# Patient Record
Sex: Female | Born: 1955 | Race: White | Hispanic: No | Marital: Married | Smoking: Former smoker
Health system: Southern US, Community
[De-identification: ages and names within clinical notes are randomized; demographics above are authoritative.]

## PROBLEM LIST (undated history)

## (undated) DIAGNOSIS — Z7189 Other specified counseling: Secondary | ICD-10-CM

## (undated) DIAGNOSIS — G43909 Migraine, unspecified, not intractable, without status migrainosus: Secondary | ICD-10-CM

## (undated) DIAGNOSIS — E039 Hypothyroidism, unspecified: Secondary | ICD-10-CM

## (undated) DIAGNOSIS — K589 Irritable bowel syndrome without diarrhea: Secondary | ICD-10-CM

## (undated) DIAGNOSIS — Z5111 Encounter for antineoplastic chemotherapy: Secondary | ICD-10-CM

## (undated) DIAGNOSIS — K219 Gastro-esophageal reflux disease without esophagitis: Secondary | ICD-10-CM

## (undated) DIAGNOSIS — M797 Fibromyalgia: Secondary | ICD-10-CM

## (undated) DIAGNOSIS — C801 Malignant (primary) neoplasm, unspecified: Secondary | ICD-10-CM

## (undated) DIAGNOSIS — R011 Cardiac murmur, unspecified: Secondary | ICD-10-CM

## (undated) DIAGNOSIS — G709 Myoneural disorder, unspecified: Secondary | ICD-10-CM

## (undated) DIAGNOSIS — F32A Depression, unspecified: Secondary | ICD-10-CM

## (undated) DIAGNOSIS — J189 Pneumonia, unspecified organism: Secondary | ICD-10-CM

## (undated) DIAGNOSIS — F419 Anxiety disorder, unspecified: Secondary | ICD-10-CM

## (undated) DIAGNOSIS — F329 Major depressive disorder, single episode, unspecified: Secondary | ICD-10-CM

## (undated) DIAGNOSIS — C3492 Malignant neoplasm of unspecified part of left bronchus or lung: Principal | ICD-10-CM

## (undated) DIAGNOSIS — J42 Unspecified chronic bronchitis: Secondary | ICD-10-CM

## (undated) HISTORY — DX: Malignant (primary) neoplasm, unspecified: C80.1

## (undated) HISTORY — PX: INNER EAR SURGERY: SHX679

## (undated) HISTORY — PX: APPENDECTOMY: SHX54

## (undated) HISTORY — PX: MOLE REMOVAL: SHX2046

## (undated) HISTORY — PX: ABDOMINAL HYSTERECTOMY: SHX81

## (undated) HISTORY — DX: Other specified counseling: Z71.89

## (undated) HISTORY — DX: Encounter for antineoplastic chemotherapy: Z51.11

## (undated) HISTORY — DX: Malignant neoplasm of unspecified part of left bronchus or lung: C34.92

## (undated) HISTORY — PX: MASTOIDECTOMY: SHX711

---

## 2010-08-24 ENCOUNTER — Other Ambulatory Visit (HOSPITAL_COMMUNITY): Payer: Self-pay | Admitting: *Deleted

## 2010-08-24 DIAGNOSIS — Z1231 Encounter for screening mammogram for malignant neoplasm of breast: Secondary | ICD-10-CM

## 2010-09-01 ENCOUNTER — Ambulatory Visit (HOSPITAL_COMMUNITY): Payer: Medicare Other

## 2010-09-13 ENCOUNTER — Ambulatory Visit (HOSPITAL_COMMUNITY)
Admission: RE | Admit: 2010-09-13 | Discharge: 2010-09-13 | Disposition: A | Discharge status: Home / Self Care | Payer: Medicare Other | Source: Ambulatory Visit | Attending: Diagnostic Radiology | Admitting: Diagnostic Radiology

## 2010-09-13 DIAGNOSIS — Z1231 Encounter for screening mammogram for malignant neoplasm of breast: Secondary | ICD-10-CM | POA: Insufficient documentation

## 2012-05-08 ENCOUNTER — Encounter (HOSPITAL_COMMUNITY): Payer: Self-pay | Admitting: *Deleted

## 2012-05-08 ENCOUNTER — Emergency Department (HOSPITAL_COMMUNITY)
Admission: EM | Admit: 2012-05-08 | Discharge: 2012-05-09 | Disposition: A | Discharge status: Home / Self Care | Payer: PRIVATE HEALTH INSURANCE | Attending: Emergency Medicine | Admitting: Emergency Medicine

## 2012-05-08 DIAGNOSIS — Z79899 Other long term (current) drug therapy: Secondary | ICD-10-CM | POA: Insufficient documentation

## 2012-05-08 DIAGNOSIS — M549 Dorsalgia, unspecified: Secondary | ICD-10-CM | POA: Insufficient documentation

## 2012-05-08 DIAGNOSIS — R1110 Vomiting, unspecified: Secondary | ICD-10-CM | POA: Insufficient documentation

## 2012-05-08 DIAGNOSIS — IMO0002 Reserved for concepts with insufficient information to code with codable children: Secondary | ICD-10-CM | POA: Insufficient documentation

## 2012-05-08 DIAGNOSIS — R000 Tachycardia, unspecified: Secondary | ICD-10-CM | POA: Insufficient documentation

## 2012-05-08 DIAGNOSIS — M791 Myalgia, unspecified site: Secondary | ICD-10-CM

## 2012-05-08 DIAGNOSIS — M542 Cervicalgia: Secondary | ICD-10-CM | POA: Insufficient documentation

## 2012-05-08 DIAGNOSIS — G43909 Migraine, unspecified, not intractable, without status migrainosus: Secondary | ICD-10-CM | POA: Insufficient documentation

## 2012-05-08 DIAGNOSIS — IMO0001 Reserved for inherently not codable concepts without codable children: Secondary | ICD-10-CM | POA: Insufficient documentation

## 2012-05-08 HISTORY — DX: Fibromyalgia: M79.7

## 2012-05-08 HISTORY — DX: Migraine, unspecified, not intractable, without status migrainosus: G43.909

## 2012-05-08 NOTE — ED Notes (Signed)
Pt c/o migraine since 730 am; c/o fibromyalgia pain in neck and back with severe vibration pain when walking; normally has to get a demerol shot; imitrex not helping at home

## 2012-05-09 ENCOUNTER — Encounter (HOSPITAL_COMMUNITY): Payer: Self-pay

## 2012-05-09 MED ORDER — METOCLOPRAMIDE HCL 5 MG/ML IJ SOLN
10.0000 mg | Freq: Once | INTRAMUSCULAR | Status: AC
  Administered 2012-05-09: 10 mg via INTRAVENOUS
  Filled 2012-05-09: qty 2

## 2012-05-09 MED ORDER — HYDROCODONE-ACETAMINOPHEN 5-325 MG PO TABS
1.0000 | ORAL_TABLET | Freq: Once | ORAL | Status: AC
  Administered 2012-05-09: 1 via ORAL
  Filled 2012-05-09: qty 1

## 2012-05-09 MED ORDER — FENTANYL CITRATE 0.05 MG/ML IJ SOLN
50.0000 ug | Freq: Once | INTRAMUSCULAR | Status: AC
  Administered 2012-05-09: 01:00:00 via INTRAVENOUS
  Filled 2012-05-09: qty 2

## 2012-05-09 MED ORDER — DIPHENHYDRAMINE HCL 50 MG/ML IJ SOLN
25.0000 mg | Freq: Once | INTRAMUSCULAR | Status: AC
  Administered 2012-05-09: 02:00:00 via INTRAVENOUS
  Filled 2012-05-09: qty 1

## 2012-05-09 MED ORDER — KETOROLAC TROMETHAMINE 30 MG/ML IJ SOLN
30.0000 mg | Freq: Once | INTRAMUSCULAR | Status: AC
  Administered 2012-05-09: 30 mg via INTRAVENOUS
  Filled 2012-05-09: qty 1

## 2012-05-09 NOTE — ED Provider Notes (Signed)
History     CSN: [id number]  Arrival date & time 05/08/12  2229   First MD Initiated Contact with Patient 05/09/12 0106      Chief Complaint  Patient presents with  . Migraine    (Consider location/radiation/quality/duration/timing/severity/associated sxs/prior treatment) HPI Comments: Patient with Hx migraine has had HA for 8 hours has taken home meds without relief Also CO neck pain that radiates to feet   This is worse that her normal fibromyalgia flare   Patient is a 57 y.o. female presenting with migraines. The history is provided by the patient.  Migraine This is a recurrent problem. The current episode started today. The problem has been unchanged. Associated symptoms include headaches, myalgias, nausea and neck pain. Pertinent negatives include no chest pain, coughing, fever, rash, sore throat, vomiting or weakness. Nothing aggravates the symptoms. She has tried nothing for the symptoms.    Past Medical History  Diagnosis Date  . Migraines   . Fibromyalgia     Past Surgical History  Procedure Date  . Inner ear surgery   . Abdominal hysterectomy     History reviewed. No pertinent family history.  History  Substance Use Topics  . Smoking status: Never Smoker   . Smokeless tobacco: Not on file  . Alcohol Use: No    OB History    Grav Para Term Preterm Abortions TAB SAB Ect Mult Living                  Review of Systems  Constitutional: Negative for fever.  HENT: Positive for neck pain. Negative for sore throat and neck stiffness.   Respiratory: Negative for cough.   Cardiovascular: Negative for chest pain.  Gastrointestinal: Positive for nausea. Negative for vomiting and diarrhea.  Genitourinary: Negative for dysuria.  Musculoskeletal: Positive for myalgias and back pain.  Skin: Negative for rash.  Neurological: Positive for headaches. Negative for weakness.    Allergies  Aspirin and Sulfa antibiotics  Home Medications   Current Outpatient Rx    Name  Route  Sig  Dispense  Refill  . VITAMIN D 1000 UNITS PO TABS   Oral   Take 5,000 Units by mouth every morning.         Marland Kitchen CITALOPRAM HYDROBROMIDE 40 MG PO TABS   Oral   Take 40 mg by mouth at bedtime.         . CYCLOBENZAPRINE HCL 10 MG PO TABS   Oral   Take 20 mg by mouth at bedtime.         Marland Kitchen ESOMEPRAZOLE MAGNESIUM 40 MG PO CPDR   Oral   Take 40 mg by mouth daily before breakfast.         . HYDROCODONE-ACETAMINOPHEN 10-325 MG PO TABS   Oral   Take 1 tablet by mouth every 4 (four) hours as needed. For pain         . LEVOTHYROXINE SODIUM 100 MCG PO TABS   Oral   Take 100 mcg by mouth every morning.         Marland Kitchen LORAZEPAM 1 MG PO TABS   Oral   Take 1 mg by mouth every 6 (six) hours as needed. For anxiety         . PROMETHAZINE HCL 25 MG PO TABS   Oral   Take 25 mg by mouth every 6 (six) hours as needed. For nausea         . SUMATRIPTAN SUCCINATE 100 MG PO TABS   Oral  Take 100 mg by mouth every 2 (two) hours as needed. For migraine         . TOPIRAMATE 200 MG PO TABS   Oral   Take 200 mg by mouth every morning.         Marland Kitchen VITAMIN E 400 UNITS PO CAPS   Oral   Take 400 Units by mouth every morning.           BP 105/69  Pulse 96  Temp 99 F (37.2 C) (Oral)  Resp 14  SpO2 94%  Physical Exam  Constitutional: She is oriented to person, place, and time. She appears well-developed and well-nourished.  HENT:  Head: Normocephalic.  Eyes: Pupils are equal, round, and reactive to light.  Neck: Normal range of motion. Muscular tenderness present. No spinous process tenderness present. Normal range of motion present.  Cardiovascular: Regular rhythm.  Tachycardia present.   Pulmonary/Chest: Effort normal and breath sounds normal.  Musculoskeletal: Normal range of motion. She exhibits no edema and no tenderness.  Neurological: She is alert and oriented to person, place, and time.  Skin: Skin is warm. No rash noted. No pallor.    ED Course   Procedures (including critical care time)  Labs Reviewed - No data to display No results found.   1. Migraine   2. Myalgia       MDM   After migraine cocktail feeling better including neck and back pain        Arman Filter, NP 05/09/12 0335  Arman Filter, NP 05/09/12 1914  Arman Filter, NP 05/09/12 [phone]

## 2012-05-09 NOTE — ED Provider Notes (Signed)
Medical screening examination/treatment/procedure(s) were performed by non-physician practitioner and as supervising physician I was immediately available for consultation/collaboration.  John-Adam Shaydon Lease, M.D.     John-Adam Jaimon Bugaj, MD 05/09/12 0729 

## 2012-05-10 ENCOUNTER — Encounter (HOSPITAL_COMMUNITY): Payer: Self-pay | Admitting: Emergency Medicine

## 2012-05-10 ENCOUNTER — Emergency Department (HOSPITAL_COMMUNITY)
Admission: EM | Admit: 2012-05-10 | Discharge: 2012-05-10 | Disposition: A | Discharge status: Home / Self Care | Payer: PRIVATE HEALTH INSURANCE | Attending: Emergency Medicine | Admitting: Emergency Medicine

## 2012-05-10 DIAGNOSIS — IMO0001 Reserved for inherently not codable concepts without codable children: Secondary | ICD-10-CM | POA: Insufficient documentation

## 2012-05-10 DIAGNOSIS — Z79899 Other long term (current) drug therapy: Secondary | ICD-10-CM | POA: Insufficient documentation

## 2012-05-10 DIAGNOSIS — G43909 Migraine, unspecified, not intractable, without status migrainosus: Secondary | ICD-10-CM | POA: Insufficient documentation

## 2012-05-10 MED ORDER — SUMATRIPTAN SUCCINATE 6 MG/0.5ML ~~LOC~~ SOLN
6.0000 mg | Freq: Once | SUBCUTANEOUS | Status: AC
  Administered 2012-05-10: 6 mg via SUBCUTANEOUS
  Filled 2012-05-10: qty 0.5

## 2012-05-10 MED ORDER — DIPHENHYDRAMINE HCL 25 MG PO CAPS: 25.0000 mg | ORAL_CAPSULE | Freq: Two times a day (BID) | ORAL | Status: DC | PRN

## 2012-05-10 MED ORDER — DIPHENHYDRAMINE HCL 25 MG PO CAPS
25.0000 mg | ORAL_CAPSULE | Freq: Once | ORAL | Status: AC
  Administered 2012-05-10: 25 mg via ORAL
  Filled 2012-05-10: qty 1

## 2012-05-10 MED ORDER — KETOROLAC TROMETHAMINE 30 MG/ML IJ SOLN
30.0000 mg | Freq: Once | INTRAMUSCULAR | Status: AC
  Administered 2012-05-10: 30 mg via INTRAMUSCULAR
  Filled 2012-05-10: qty 1

## 2012-05-10 MED ORDER — PREDNISONE 50 MG PO TABS: 50.0000 mg | ORAL_TABLET | Freq: Every day | ORAL | Status: DC

## 2012-05-10 MED ORDER — PROCHLORPERAZINE MALEATE 10 MG PO TABS: 10.0000 mg | ORAL_TABLET | Freq: Two times a day (BID) | ORAL | Status: DC | PRN

## 2012-05-10 MED ORDER — PROCHLORPERAZINE EDISYLATE 5 MG/ML IJ SOLN
10.0000 mg | Freq: Four times a day (QID) | INTRAMUSCULAR | Status: DC | PRN
Start: 2012-05-10 — End: 2012-05-10
  Administered 2012-05-10: 10 mg via INTRAMUSCULAR
  Filled 2012-05-10: qty 2

## 2012-05-10 NOTE — ED Notes (Signed)
Pt ambulated to bathroom without assistance 

## 2012-05-10 NOTE — ED Provider Notes (Signed)
History     CSN: [id number]  Arrival date & time 05/10/12  2956   First MD Initiated Contact with Patient 05/10/12 0344      Chief Complaint  Patient presents with  . Migraine    (Consider location/radiation/quality/duration/timing/severity/associated sxs/prior treatment) HPIJodie Molina is a 57 y.o. female who was seen last night in the ER for migraine, received a migraine cocktail and did well until this evening. According to the patient, headache was almost gone and she was able to go home and get some sleep after seeing her primary care physician. She received intramuscular Toradol and Phenergan this afternoon from her primary care doctor, went home and was able to sleep till about midnight. When the patient woke up, She had another headache that gradually got worse and is now a full-blown migraine again, is throbbing, at the top of her head, severe, associated with photophobia phonophobia, smells irritate her, nausea and vomiting x1-these are her typical migraine symptoms, there is nothing new. She says one to 2 times a year she gets severe migraines that don't respond to her typical migraine medicines-she takes Topamax daily for prophylaxis and Imitrex as needed as an abortive therapy. Patient has not taken her Imitrex today because "it won't work."  She denies any numbness, tingling, fever, neck stiffness, weakness, difficulties walking talking, chest pain shortness of breath, or chills. No abdominal pain and no diarrhea.    Past Medical History  Diagnosis Date  . Migraines   . Fibromyalgia     Past Surgical History  Procedure Date  . Inner ear surgery   . Abdominal hysterectomy     No family history on file.  History  Substance Use Topics  . Smoking status: Never Smoker   . Smokeless tobacco: Not on file  . Alcohol Use: No    OB History    Grav Para Term Preterm Abortions TAB SAB Ect Mult Living                  Review of Systems At least 10pt or greater review  of systems completed and are negative except where specified in the HPI.  Allergies  Aspirin and Sulfa antibiotics  Home Medications   Current Outpatient Rx  Name  Route  Sig  Dispense  Refill  . VITAMIN D 1000 UNITS PO TABS   Oral   Take 5,000 Units by mouth every morning.         Marland Kitchen CITALOPRAM HYDROBROMIDE 40 MG PO TABS   Oral   Take 40 mg by mouth at bedtime.         . CYCLOBENZAPRINE HCL 10 MG PO TABS   Oral   Take 20 mg by mouth at bedtime.         Marland Kitchen ESOMEPRAZOLE MAGNESIUM 40 MG PO CPDR   Oral   Take 40 mg by mouth daily before breakfast.         . HYDROCODONE-ACETAMINOPHEN 10-325 MG PO TABS   Oral   Take 1 tablet by mouth every 4 (four) hours as needed. For pain         . LEVOTHYROXINE SODIUM 100 MCG PO TABS   Oral   Take 100 mcg by mouth every morning.         Marland Kitchen LORAZEPAM 1 MG PO TABS   Oral   Take 1 mg by mouth every 6 (six) hours as needed. For anxiety         . PROMETHAZINE HCL 25 MG PO TABS  Oral   Take 25 mg by mouth every 6 (six) hours as needed. For nausea         . SUMATRIPTAN SUCCINATE 100 MG PO TABS   Oral   Take 100 mg by mouth every 2 (two) hours as needed. For migraine         . TOPIRAMATE 200 MG PO TABS   Oral   Take 200 mg by mouth every morning.         Marland Kitchen VITAMIN E 400 UNITS PO CAPS   Oral   Take 400 Units by mouth every morning.           BP 146/79  Pulse 96  Temp 97.8 F (36.6 C) (Oral)  Resp 22  SpO2 98%  Physical Exam  PHYSICAL EXAM: VITAL SIGNS:  . Filed Vitals:   05/10/12 0322  BP: 146/79  Pulse: 96  Temp: 97.8 F (36.6 C)  TempSrc: Oral  Resp: 22  SpO2: 98%   CONSTITUTIONAL: Awake, oriented, appears non-toxic HENT: Atraumatic, normocephalic, oral mucosa pink and moist, airway patent. Nares patent without drainage. External ears normal. EYES: Conjunctiva clear, EOMI, PERRLA NECK: Trachea midline, non-tender, supple CARDIOVASCULAR: Normal heart rate, Normal rhythm, No murmurs, rubs,  gallops PULMONARY/CHEST: Clear to auscultation, no rhonchi, wheezes, or rales. Symmetrical breath sounds. CHEST WALL: No lesions. Non-tender. ABDOMINAL: Non-distended, soft, non-tender - no rebound or guarding.  BS normal. NEUROLOGIC: ZO:XWRUEA fields intact. PERRLA, EOMI.  Facial sensation equal to light touch bilaterally.  Good muscle bulk in the masseter muscle and good lateral movement of the jaw.  Facial expressions equal and good strength with smile/frown and puffed cheeks.  Hearing grossly intact to finger rub test.  Uvula, tongue are midline with no deviation. Symmetrical palate elevation.  Trapezius and SCM muscles are 5/5 strength bilaterally.   Strength: 5/5 strength flexors and extensors in the upper and lower extremities.  Grip strength, finger adduction/abduction 5/5. Sensation: Sensation intact distally to light touch Gait: Patient walks unassisted With a normal gait EXTREMITIES: No clubbing, cyanosis, or edema SKIN: Warm, Dry, No erythema, No rash   ED Course  Procedures (including critical care time)  Labs Reviewed - No data to display No results found.   1. Migraine      Medications  prochlorperazine (COMPAZINE) 10 MG tablet (not administered)  diphenhydrAMINE (BENADRYL) 25 mg capsule (not administered)  predniSONE (DELTASONE) 50 MG tablet (not administered)  ketorolac (TORADOL) 30 MG/ML injection 30 mg (30 mg Intramuscular Given 05/10/12 0433)  diphenhydrAMINE (BENADRYL) capsule 25 mg (25 mg Oral Given 05/10/12 0433)  SUMAtriptan (IMITREX) injection 6 mg (6 mg Subcutaneous Given 05/10/12 0539)     MDM  Jenna Molina is a 57 y.o. female presenting for the third time to medical professionals with migraine, likely now status migrainosus, patient is had some dopaminergic medications including Phenergan today and Phenergan last night, will use Compazine today as well as of Toradol. Patient has not used Imitrex today, will follow with Imitrex if the patient does not respond  to Compazine.  Reassess patient after administration of Compazine and Toradol, she says her headache pain is now down to a 3 from a 10. She feels like she is able to be discharged.  As the nurse was about 2 discharge the patient, patient says that her headache is worse upon sitting up, and she's not sure she should go home at this point. We'll give the patient some Imitrex at this time and reevaluate.  Patient receives Imitrex and shortly thereafter  said her headache pain was relieved and is asking to leave. We'll discharge the patient home stable and in good condition. Will send her home with some abortive therapies for her migraine headache. Compazine plus Benadryl-medication side effects including akathisia and dystonia has been discussed risks and benefits, patient understands and accepts medical plan as it's been dictated.          Jones Skene, MD 05/12/12 0800

## 2012-05-10 NOTE — ED Notes (Signed)
Pt states she doesn't feel any better since she sat up.

## 2012-05-10 NOTE — ED Notes (Signed)
Pt c/o migraine x 48 hours, pt states she was seen by PCP today. Pt states she has not been given script to take home.

## 2012-05-10 NOTE — Discharge Instructions (Signed)
Migraine Headache A migraine headache is an intense, throbbing pain on one or both sides of your head. A migraine can last for 30 minutes to several hours. CAUSES  The exact cause of a migraine headache is not always known. However, a migraine may be caused when nerves in the brain become irritated and release chemicals that cause inflammation. This causes pain. SYMPTOMS  Pain on one or both sides of your head.  Pulsating or throbbing pain.  Severe pain that prevents daily activities.  Pain that is aggravated by any physical activity.  Nausea, vomiting, or both.  Dizziness.  Pain with exposure to bright lights, loud noises, or activity.  General sensitivity to bright lights, loud noises, or smells. Before you get a migraine, you may get warning signs that a migraine is coming (aura). An aura may include:  Seeing flashing lights.  Seeing bright spots, halos, or zig-zag lines.  Having tunnel vision or blurred vision.  Having feelings of numbness or tingling.  Having trouble talking.  Having muscle weakness. MIGRAINE TRIGGERS  Alcohol.  Smoking.  Stress.  Menstruation.  Aged cheeses.  Foods or drinks that contain nitrates, glutamate, aspartame, or tyramine.  Lack of sleep.  Chocolate.  Caffeine.  Hunger.  Physical exertion.  Fatigue.  Medicines used to treat chest pain (nitroglycerine), birth control pills, estrogen, and some blood pressure medicines. DIAGNOSIS  A migraine headache is often diagnosed based on:  Symptoms.  Physical examination.  A CT scan or MRI of your head. TREATMENT Medicines may be given for pain and nausea. Medicines can also be given to help prevent recurrent migraines.  HOME CARE INSTRUCTIONS  Only take over-the-counter or prescription medicines for pain or discomfort as directed by your caregiver. The use of long-term narcotics is not recommended.  Lie down in a dark, quiet room when you have a migraine.  Keep a journal  to find out what may trigger your migraine headaches. For example, write down:  What you eat and drink.  How much sleep you get.  Any change to your diet or medicines.  Limit alcohol consumption.  Quit smoking if you smoke.  Get 7 to 9 hours of sleep, or as recommended by your caregiver.  Limit stress.  Keep lights dim if bright lights bother you and make your migraines worse. SEEK IMMEDIATE MEDICAL CARE IF:   Your migraine becomes severe.  You have a fever.  You have a stiff neck.  You have vision loss.  You have muscular weakness or loss of muscle control.  You start losing your balance or have trouble walking.  You feel faint or pass out.  You have severe symptoms that are different from your first symptoms. MAKE SURE YOU:   Understand these instructions.  Will watch your condition.  Will get help right away if you are not doing well or get worse. Document Released: 04/02/2005 Document Revised: 06/25/2011 Document Reviewed: 03/23/2011 [employer] Patient Information 2013 [employer].  

## 2012-05-10 NOTE — ED Notes (Signed)
Pt states she has Hydrocodone 10 at home but they do not help pain, pt also has Imitrex but she has not taken that tonight

## 2012-09-17 ENCOUNTER — Other Ambulatory Visit (HOSPITAL_COMMUNITY): Payer: Self-pay | Admitting: Obstetrics and Gynecology

## 2012-09-17 DIAGNOSIS — Z1231 Encounter for screening mammogram for malignant neoplasm of breast: Secondary | ICD-10-CM

## 2012-10-02 ENCOUNTER — Ambulatory Visit (HOSPITAL_COMMUNITY): Payer: Medicare Other

## 2012-12-16 ENCOUNTER — Encounter: Payer: Self-pay | Admitting: Physical Medicine & Rehabilitation

## 2012-12-16 ENCOUNTER — Encounter
Payer: PRIVATE HEALTH INSURANCE | Attending: Physical Medicine & Rehabilitation | Admitting: Physical Medicine & Rehabilitation

## 2012-12-16 VITALS — BP 109/69 | HR 78 | Resp 14 | Ht 64.0 in | Wt 136.0 lb

## 2012-12-16 DIAGNOSIS — Z90710 Acquired absence of both cervix and uterus: Secondary | ICD-10-CM | POA: Diagnosis not present

## 2012-12-16 DIAGNOSIS — G43009 Migraine without aura, not intractable, without status migrainosus: Secondary | ICD-10-CM

## 2012-12-16 DIAGNOSIS — F329 Major depressive disorder, single episode, unspecified: Secondary | ICD-10-CM | POA: Diagnosis not present

## 2012-12-16 DIAGNOSIS — G43909 Migraine, unspecified, not intractable, without status migrainosus: Secondary | ICD-10-CM | POA: Diagnosis not present

## 2012-12-16 DIAGNOSIS — Z79899 Other long term (current) drug therapy: Secondary | ICD-10-CM

## 2012-12-16 DIAGNOSIS — Z5181 Encounter for therapeutic drug level monitoring: Secondary | ICD-10-CM

## 2012-12-16 DIAGNOSIS — R52 Pain, unspecified: Secondary | ICD-10-CM

## 2012-12-16 DIAGNOSIS — Z8541 Personal history of malignant neoplasm of cervix uteri: Secondary | ICD-10-CM | POA: Diagnosis not present

## 2012-12-16 DIAGNOSIS — M797 Fibromyalgia: Secondary | ICD-10-CM | POA: Insufficient documentation

## 2012-12-16 DIAGNOSIS — E039 Hypothyroidism, unspecified: Secondary | ICD-10-CM

## 2012-12-16 DIAGNOSIS — IMO0001 Reserved for inherently not codable concepts without codable children: Secondary | ICD-10-CM

## 2012-12-16 DIAGNOSIS — F3289 Other specified depressive episodes: Secondary | ICD-10-CM

## 2012-12-16 LAB — CBC WITH DIFFERENTIAL/PLATELET
Basophils Relative: 0 % (ref 0–1)
Eosinophils Absolute: 0.1 10*3/uL (ref 0.0–0.7)
Eosinophils Relative: 1 % (ref 0–5)
HCT: 37.6 % (ref 36.0–46.0)
Hemoglobin: 12.6 g/dL (ref 12.0–15.0)
MCH: 28.1 pg (ref 26.0–34.0)
MCHC: 33.5 g/dL (ref 30.0–36.0)
MCV: 83.9 fL (ref 78.0–100.0)
Monocytes Absolute: 0.7 10*3/uL (ref 0.1–1.0)
Monocytes Relative: 9 % (ref 3–12)
Neutrophils Relative %: 55 % (ref 43–77)

## 2012-12-16 LAB — COMPREHENSIVE METABOLIC PANEL
AST: 34 U/L (ref 0–37)
Albumin: 5.1 g/dL (ref 3.5–5.2)
Alkaline Phosphatase: 73 U/L (ref 39–117)
BUN: 15 mg/dL (ref 6–23)
Potassium: 4 mEq/L (ref 3.5–5.3)

## 2012-12-16 LAB — VITAMIN B12: Vitamin B-12: 815 pg/mL (ref 211–911)

## 2012-12-16 LAB — FOLATE: Folate: 12.5 ng/mL

## 2012-12-16 LAB — T3, FREE: T3, Free: 2.6 pg/mL (ref 2.3–4.2)

## 2012-12-16 NOTE — Progress Notes (Signed)
Subjective:    Patient ID: Jenna Molina, female    DOB: Sep 22, 1955, 57 y.o.   MRN: [id number]  HPI  This is an initial visit ( referred by Dr. Sigmund Hazel) for Jenna Molina who is here regarding her chronic fibromyalgia pain. She has had symptoms since her 80's. She was formally dx'ed at age 64yo. At age 24 (1982) she had a total hys hysterectomy/SBO for cervical cancer/endometriosis at which point her symptoms generally worsened.   Jenna Molina has been dealing with a lot of emotional stressors related to her family concerning issues back to her childhood. She has had ongoing counseling to help deal with these issues. She doesn't have a formal psychiatrist. She moved back to [place] in 2011 from [place].   She has been hypothyroid since her hysterectomy. She has had her thyroid checked once since sh'es been in [place] (per pt)  For pain, she is taking 2 (10/325) hydrocodone at 4am,11am, and 5pm. This has been her schedule chronically.   For activity, she walks, paint, gardens, etc. She has done yoga in the past, but hasn't been involved with that lately (she attributes her psych issues to curtailing this). She works in the house as well.  She hasn't had formal therapy for her FMS, but had it once for a fall she had several years ago where she landed on her left ischium. Apparently, she has had numerous imaging/tests of her pelvis and low back all of which have been negative.   Her headaches have been under fairly good control with topamax, imitrex.  She typically has 12-15 headaches a month. She has no aura. The location is usually high frontal/parietal, sometime temporal. Sometimes they are TMJ driven.   Her sleep has been fairly good. She takes cyclobenzaprine at night which works well for sleep and her RLS symptoms.   Pain Inventory Average Pain 4 Pain Right Now 5 My pain is sharp, burning, dull, stabbing, tingling and aching  In the last 24 hours, has pain interfered with the following? General  activity 4 Relation with others 4 Enjoyment of life 6 What TIME of day is your pain at its worst? evening Sleep (in general) Fair  Pain is worse with: walking, bending, sitting, inactivity, standing and some activites Pain improves with: rest, heat/ice, pacing activities, medication and TENS Relief from Meds: 8  Mobility walk without assistance ability to climb steps?  yes do you drive?  yes use a wheelchair  Function disabled: date disabled 07/2004 I need assistance with the following:  household duties and shopping  Neuro/Psych bowel control problems numbness tremor tingling spasms confusion depression anxiety  Prior Studies bone scan x-rays CT/MRI nerve study EEG [facility]  Physicians involved in your care Dr Hyacinth Meeker, Dr Doran Heater   Family History  Problem Relation Age of Onset  . Cancer Mother   . Diabetes Mother   . Anxiety disorder Mother   . Emphysema Father   . Anxiety disorder Father   . Alcohol abuse Father   . COPD Father    History   Social History  . Marital Status: Married    Spouse Name: N/A    Number of Children: N/A  . Years of Education: N/A   Social History Main Topics  . Smoking status: Former Smoker    Quit date: 06/19/2008  . Smokeless tobacco: None  . Alcohol Use: No  . Drug Use: No  . Sexual Activity: None   Other Topics Concern  . None   Social History Narrative  .  None   Past Surgical History  Procedure Laterality Date  . Inner ear surgery    . Abdominal hysterectomy    . Appendectomy    . Mastoidectomy     Past Medical History  Diagnosis Date  . Migraines   . Fibromyalgia   . Cancer     Cervical cancer   BP 109/69  Pulse 78  Resp 14  Ht 5\' 4"  (1.626 m)  Wt 136 lb (61.689 kg)  BMI 23.33 kg/m2  SpO2 96%     Review of Systems  Constitutional: Positive for unexpected weight change.  Gastrointestinal: Positive for constipation.  Neurological: Positive for numbness.  Psychiatric/Behavioral:  Positive for confusion and dysphoric mood. The patient is nervous/anxious.   All other systems reviewed and are negative.       Objective:   Physical Exam  General: Alert and oriented x 3, No apparent distress HEENT: Head is normocephalic, atraumatic, PERRLA, EOMI, sclera anicteric, oral mucosa pink and moist, dentition intact, ext ear canals clear,  Neck: Supple without JVD or lymphadenopathy Heart: Reg rate and rhythm. No murmurs rubs or gallops Chest: CTA bilaterally without wheezes, rales, or rhonchi; no distress Abdomen: Soft, non-tender, non-distended, bowel sounds positive. Extremities: No clubbing, cyanosis, or edema. Pulses are 2+ Skin: Clean and intact without signs of breakdown Neuro: Pt is cognitively appropriate with normal insight, memory, and awareness. Cranial nerves 2-12 are intact. Sensory exam is normal. Reflexes are 2+ in all 4's. Fine motor coordination is intact. No tremors. Motor function is grossly 5/5.  SLR is negative, but she does have hamstring tightness bilaterally.  Musculoskeletal: can flex to 90 degrees---has a difficult time extending back to neutral. Some tenderness with rotation and laterabl bending. Cervical ROM functional. Hamstrings are tight. Numerous tender points along knees, greater trochs, PSIS's, low back, elbows, sternum, mid traps, shoulders, sub-occipital. Gait is stable, no obvious antalgia. Posture actually quite good.  Psych: Pt's affect is appropriate. Pt is cooperative. She's pleasant and doesn't appear outwardly depressed.          Assessment & Plan:  1. FMS with associated symptoms including: chronic fatigue, depression, migraine headaches, RLS, TMJ, numerous tender points.  2. Hx of major depression 3. Hypothyroid   Plan: 1. I am willing to continue with her hydrocodone for pain control, although I would like to explore other options than her 6 hydrocodone a day which she's using now. A UDS was collected today, and if it's  consistent I will assume the writing of this rx. 2. Discussed the importance of her mental health as it pertains to her pain. I believe that she is aware of this. She will continue with her psychological counseling. We can look at adjustments to her medications as well. May be useful to have a psychiatrist involved in her care as well. 3. Reviewed the impact of aerobic exercise. She has let much of this go, since her mood worsened. She wants to get back into yoga. I want her to work on more vigorous, aerobic exercise as well. 4. Consider formal physical therapy as well [facility]  5. Drew numerous labs today including thyroid, mg, b12, vitamin d, cmet, cbc to look for any other contributors to her pain syndrome 6. Follow up with me in a month. 45 minutes of face to face patient care time were spent during this visit. All questions were encouraged and answered.

## 2012-12-16 NOTE — Patient Instructions (Signed)
TRY DHEA 25MG  DAILY FOR PAIN/MUSCLE ENERGY/MUSCLE STAMINA  INCREASE YOUR AEROBIC EXERCISE

## 2012-12-17 ENCOUNTER — Telehealth: Payer: Self-pay

## 2012-12-17 NOTE — Telephone Encounter (Signed)
Patient called and wanted [place] to be aware that after her appointment she was in increase pain due to the exam.  She wanted this documented in her chart.

## 2012-12-17 NOTE — Telephone Encounter (Signed)
There was minimal "physicality" with her exam. The most difficult thing she did was bend at the waist to 90 degrees/

## 2013-01-14 ENCOUNTER — Encounter
Payer: PRIVATE HEALTH INSURANCE | Attending: Physical Medicine & Rehabilitation | Admitting: Physical Medicine & Rehabilitation

## 2013-01-14 ENCOUNTER — Encounter: Payer: Self-pay | Admitting: Physical Medicine & Rehabilitation

## 2013-01-14 VITALS — BP 118/68 | HR 88 | Resp 14 | Ht 64.0 in | Wt 136.0 lb

## 2013-01-14 DIAGNOSIS — G43009 Migraine without aura, not intractable, without status migrainosus: Secondary | ICD-10-CM

## 2013-01-14 DIAGNOSIS — F3289 Other specified depressive episodes: Secondary | ICD-10-CM

## 2013-01-14 DIAGNOSIS — IMO0001 Reserved for inherently not codable concepts without codable children: Secondary | ICD-10-CM

## 2013-01-14 DIAGNOSIS — F329 Major depressive disorder, single episode, unspecified: Secondary | ICD-10-CM | POA: Insufficient documentation

## 2013-01-14 MED ORDER — HYDROCODONE-ACETAMINOPHEN 10-325 MG PO TABS: 2.0000 | ORAL_TABLET | Freq: Three times a day (TID) | ORAL | Status: DC | PRN

## 2013-01-14 NOTE — Patient Instructions (Addendum)
Extended release hydrocodone----zohydro 10-20mg  every 12 hours   Keep up with your exercise.

## 2013-01-14 NOTE — Progress Notes (Signed)
Subjective:    Patient ID: Jenna Molina, female    DOB: Jan 14, 1956, 57 y.o.   MRN: [id number]  HPI  Jenna Molina is back regarding her chronic pain/ FMS. Her pain has been a little worse with the cooler, damp weather. She also has ongoing pain in there left buttock to low back that has been more tender since her fall in April.   Her psychologist has tasked her start going to [employer] and to become more active in the community. She has been to church all month and has volunteered there quite a bit as well. She has tried to pursue activity afterwards as well. She feels that if she "overdoes" it she may be knocked out for "days.".      Pain Inventory Average Pain 7 Pain Right Now 7 My pain is burning, stabbing, tingling and aching  In the last 24 hours, has pain interfered with the following? General activity 5 Relation with others 9 Enjoyment of life 9 What TIME of day is your pain at its worst? evening Sleep (in general) Fair  Pain is worse with: walking, bending, sitting, standing and some activites Pain improves with: rest, heat/ice, pacing activities and medication Relief from Meds: 8  Mobility walk without assistance ability to climb steps?  yes do you drive?  yes use a wheelchair Do you have any goals in this area?  yes  Function disabled: date disabled 2006 Do you have any goals in this area?  no  Neuro/Psych bowel control problems weakness numbness tingling trouble walking spasms depression anxiety suicidal thoughts-no plan  Prior Studies Any changes since last visit?  no  Physicians involved in your care Any changes since last visit?  no   Family History  Problem Relation Age of Onset  . Cancer Mother   . Diabetes Mother   . Anxiety disorder Mother   . Emphysema Father   . Anxiety disorder Father   . Alcohol abuse Father   . COPD Father    History   Social History  . Marital Status: Married    Spouse Name: N/A    Number of Children: N/A  .  Years of Education: N/A   Social History Main Topics  . Smoking status: Former Smoker    Quit date: 06/19/2008  . Smokeless tobacco: None  . Alcohol Use: No  . Drug Use: No  . Sexual Activity: None   Other Topics Concern  . None   Social History Narrative  . None   Past Surgical History  Procedure Laterality Date  . Inner ear surgery    . Abdominal hysterectomy    . Appendectomy    . Mastoidectomy     Past Medical History  Diagnosis Date  . Migraines   . Fibromyalgia   . Cancer     Cervical cancer   BP 118/68  Pulse 88  Resp 14  Ht 5\' 4"  (1.626 m)  Wt 136 lb (61.689 kg)  BMI 23.33 kg/m2  SpO2 97%     Review of Systems  Constitutional: Positive for diaphoresis, appetite change and unexpected weight change.  HENT: Positive for neck pain.   Gastrointestinal: Positive for nausea and constipation.  Musculoskeletal: Positive for myalgias, back pain, arthralgias and gait problem.  Neurological: Positive for weakness and numbness.  Psychiatric/Behavioral: Positive for suicidal ideas and dysphoric mood. The patient is nervous/anxious.   All other systems reviewed and are negative.       Objective:   Physical Exam General: Alert and oriented  x 3, No apparent distress  HEENT: Head is normocephalic, atraumatic, PERRLA, EOMI, sclera anicteric, oral mucosa pink and moist, dentition intact, ext ear canals clear,  Neck: Supple without JVD or lymphadenopathy  Heart: Reg rate and rhythm. No murmurs rubs or gallops  Chest: CTA bilaterally without wheezes, rales, or rhonchi; no distress  Abdomen: Soft, non-tender, non-distended, bowel sounds positive.  Extremities: No clubbing, cyanosis, or edema. Pulses are 2+  Skin: Clean and intact without signs of breakdown  Neuro: Pt is cognitively appropriate with normal insight, memory, and awareness. Cranial nerves 2-12 are intact. Sensory exam is normal. Reflexes are 2+ in all 4's. Fine motor coordination is intact. No tremors.  Motor function is grossly 5/5. SLR is negative, but she does have hamstring tightness bilaterally.  Musculoskeletal: can flex to 90 degrees---has a difficult time extending back to neutral. Some tenderness with rotation and laterabl bending. Cervical ROM functional. Hamstrings are tight. Numerous tender points along knees, greater trochs, PSIS's, low back, elbows, sternum, mid traps, shoulders, sub-occipital. Gait is stable, no obvious antalgia. Posture actually quite good. She had trigger points over the left L4 paraspinal and upper left glut max Psych: Pt's affect is appropriate. Pt is cooperative. She's pleasant and doesn't appear outwardly depressed.   Assessment & Plan:   1. FMS with associated symptoms including: chronic fatigue, depression, migraine headaches, RLS, TMJ, numerous tender points and trigger points.  2. Hx of major depression  3. Hypothyroid     Plan:  1. I refilled her hydrocodone 10/325 #180. Discussed use of along acting agent potentially.  2. She will continue with her psychological counseling. We can look at adjustments to her medications as well. May be useful to have a psychiatrist involved in her care as well.  3. Reviewed the impact of aerobic exercise. She has let much of this go, since her mood worsened. She wants to get back into yoga. I want her to work on more vigorous, aerobic exercise as well. I'm ok with her supplements 4. Made a referral to outpt PT to address posture, ROM, myofascial techniques, HEp.  5. Recheck T3,T4, lft's at beginning of the year 6. After informed consent and preparation of the skin with isopropyl alcohol, I injected the left lumbar paraspinals and left glut max each with 2cc of 1% lidocaine. The patient tolerated well, and no complications were experienced. Post-injection instructions were provided.  6. Follow up with me in 6 weeks. 30 minutes of face to face patient care time were spent during this visit. All questions were encouraged  and answered.               Patient given new office policy regarding hydrocodone.

## 2013-01-20 ENCOUNTER — Ambulatory Visit: Payer: Medicare Other | Admitting: Physical Therapy

## 2013-02-09 ENCOUNTER — Telehealth: Payer: Self-pay

## 2013-02-09 DIAGNOSIS — IMO0001 Reserved for inherently not codable concepts without codable children: Secondary | ICD-10-CM

## 2013-02-09 DIAGNOSIS — G43009 Migraine without aura, not intractable, without status migrainosus: Secondary | ICD-10-CM

## 2013-02-09 NOTE — Telephone Encounter (Signed)
i hope that she is ok. Please let me know if there's anything i need to do.

## 2013-02-09 NOTE — Telephone Encounter (Signed)
We could try the 20mg  q12---it would be the equivalent of four hydrocodone 10's per day (without the tylenol)---it's just ER. I would be willing to Rx if she would like.

## 2013-02-09 NOTE — Telephone Encounter (Signed)
FYI:: Patient called to inform you that @ 11:30am on 10/25 she was in the garage and fell on her left side. She hit her knee, hip and the middle of her back between shoulders. She just wanted you to know since she is taking PT due to a fall on the same side.

## 2013-02-09 NOTE — Telephone Encounter (Signed)
She actually called back today and said she checked with her insurance company about Zohydro 20 mg as you two were discussing per patient. Her insurance will cover the RX co-pay will be $65 for a 30 day supply. Patient was wondering if the Zohydro with the Hydrocodone would help with her pain from the fall on 10/25 since the Hydrocodone is not relieving her pain alone? Please advise!

## 2013-02-10 MED ORDER — HYDROCODONE-ACETAMINOPHEN 5-325 MG PO TABS: 1.0000 | ORAL_TABLET | Freq: Four times a day (QID) | ORAL | Status: DC | PRN

## 2013-02-10 MED ORDER — HYDROCODONE BITARTRATE ER 20 MG PO CP12: 20.0000 mg | ORAL_CAPSULE | Freq: Two times a day (BID) | ORAL | Status: DC

## 2013-02-10 NOTE — Telephone Encounter (Signed)
Left patient a voicemail to return call to clinic. 

## 2013-02-10 NOTE — Telephone Encounter (Signed)
What I prescribed is the equivalent of 4.5 of her current hydrocodone (minus acetaminophen essentially).

## 2013-02-10 NOTE — Telephone Encounter (Addendum)
Jenna Molina is concerned because it is cutting her back by one hydrocodone a day if she agrees to take the zohydro , so she is going to stick with what she has until she comes in to see you at her next appt.  Printed rx's shredded and d/c off list

## 2013-02-10 NOTE — Telephone Encounter (Signed)
i would be willing to rx 30 hydrocodone 5/325 to be used for breakthrough pain, daily prn

## 2013-02-10 NOTE — Telephone Encounter (Signed)
Patient called back and would appreciate if you would write that RX for the Zohydro 20 mg for her to pickup. She was also wondering if she still was to continue taking Hydrocodone? If so how many and directions?

## 2013-02-17 ENCOUNTER — Ambulatory Visit: Payer: PRIVATE HEALTH INSURANCE | Attending: Physical Medicine & Rehabilitation | Admitting: Physical Therapy

## 2013-02-17 DIAGNOSIS — IMO0001 Reserved for inherently not codable concepts without codable children: Secondary | ICD-10-CM | POA: Insufficient documentation

## 2013-02-17 DIAGNOSIS — M545 Low back pain, unspecified: Secondary | ICD-10-CM | POA: Insufficient documentation

## 2013-02-17 DIAGNOSIS — M546 Pain in thoracic spine: Secondary | ICD-10-CM | POA: Insufficient documentation

## 2013-02-19 ENCOUNTER — Other Ambulatory Visit: Payer: Self-pay

## 2013-02-24 ENCOUNTER — Encounter
Payer: PRIVATE HEALTH INSURANCE | Attending: Physical Medicine & Rehabilitation | Admitting: Physical Medicine & Rehabilitation

## 2013-02-24 ENCOUNTER — Encounter: Payer: Self-pay | Admitting: Physical Medicine & Rehabilitation

## 2013-02-24 VITALS — BP 126/70 | HR 89 | Resp 14 | Ht 64.0 in | Wt 137.0 lb

## 2013-02-24 DIAGNOSIS — IMO0001 Reserved for inherently not codable concepts without codable children: Secondary | ICD-10-CM | POA: Insufficient documentation

## 2013-02-24 DIAGNOSIS — F329 Major depressive disorder, single episode, unspecified: Secondary | ICD-10-CM

## 2013-02-24 DIAGNOSIS — G43009 Migraine without aura, not intractable, without status migrainosus: Secondary | ICD-10-CM | POA: Insufficient documentation

## 2013-02-24 MED ORDER — HYDROCODONE BITARTRATE ER 20 MG PO CP12: 20.0000 mg | ORAL_CAPSULE | Freq: Two times a day (BID) | ORAL | Status: DC

## 2013-02-24 MED ORDER — HYDROCODONE-ACETAMINOPHEN 10-325 MG PO TABS: 1.0000 | ORAL_TABLET | Freq: Two times a day (BID) | ORAL | Status: DC | PRN

## 2013-02-24 NOTE — Patient Instructions (Signed)
Continue to work on exercise and range of motion.

## 2013-02-24 NOTE — Progress Notes (Signed)
Subjective:    Patient ID: Jenna Molina, female    DOB: 1955-12-11, 57 y.o.   MRN: [id number]  HPI   Jenna Molina is back regarding her chronic pain. Her pain levels have been fairly stable from last month. She seems to have become acclimated to the cooler weather last month.   She went to one visit with PT and felt that it was helpful. She only had one visit as they await to see what type of coverage she has. She really liked the TENS unit and deep heat.   She remains on the norco for breakthrough pain. We had discussed a trial of zohydro for chronic mgt of pain.   Pain Inventory Average Pain 8 Pain Right Now 6 My pain is intermittent, constant, sharp, dull, stabbing and aching  In the last 24 hours, has pain interfered with the following? General activity 7 Relation with others 4 Enjoyment of life 9 What TIME of day is your pain at its worst? evening, night Sleep (in general) Fair  Pain is worse with: walking, sitting and some activites Pain improves with: rest, heat/ice, pacing activities, medication and TENS Relief from Meds: 7  Mobility walk without assistance ability to climb steps?  yes do you drive?  yes use a wheelchair Do you have any goals in this area?  yes  Function disabled: date disabled 2006 I need assistance with the following:  household duties and shopping Do you have any goals in this area?  no  Neuro/Psych bowel control problems weakness numbness tingling spasms depression anxiety  Prior Studies Any changes since last visit?  no  Physicians involved in your care Any changes since last visit?  no   Family History  Problem Relation Age of Onset  . Cancer Mother   . Diabetes Mother   . Anxiety disorder Mother   . Emphysema Father   . Anxiety disorder Father   . Alcohol abuse Father   . COPD Father    History   Social History  . Marital Status: Married    Spouse Name: N/A    Number of Children: N/A  . Years of Education: N/A   Social  History Main Topics  . Smoking status: Former Smoker    Quit date: 06/19/2008  . Smokeless tobacco: Not on file  . Alcohol Use: No  . Drug Use: No  . Sexual Activity: Not on file   Other Topics Concern  . Not on file   Social History Narrative  . No narrative on file   Past Surgical History  Procedure Laterality Date  . Inner ear surgery    . Abdominal hysterectomy    . Appendectomy    . Mastoidectomy     Past Medical History  Diagnosis Date  . Migraines   . Fibromyalgia   . Cancer     Cervical cancer   There were no vitals taken for this visit.  ,   Review of Systems  Genitourinary:       Bowel control problems  Neurological: Positive for weakness and numbness.       Tingling, spasms  Psychiatric/Behavioral: Positive for dysphoric mood. The patient is nervous/anxious.   All other systems reviewed and are negative.       Objective:   Physical Exam General: Alert and oriented x 3, No apparent distress  HEENT: Head is normocephalic, atraumatic, PERRLA, EOMI, sclera anicteric, oral mucosa pink and moist, dentition intact, ext ear canals clear,  Neck: Supple without JVD or lymphadenopathy  Heart: Reg rate and rhythm. No murmurs rubs or gallops  Chest: CTA bilaterally without wheezes, rales, or rhonchi; no distress  Abdomen: Soft, non-tender, non-distended, bowel sounds positive.  Extremities: No clubbing, cyanosis, or edema. Pulses are 2+  Skin: Clean and intact without signs of breakdown  Neuro: Pt is cognitively appropriate with normal insight, memory, and awareness. Cranial nerves 2-12 are intact. Sensory exam is normal. Reflexes are 2+ in all 4's. Fine motor coordination is intact. No tremors. Motor function is grossly 5/5. SLR is negative, but she does have hamstring tightness bilaterally.  Musculoskeletal: can flex beyond 90 degrees-- better tolerance of all lumbar movement.  Cervical ROM functional. Hamstrings are tight. Numerous tender points along knees,  greater trochs, PSIS's, low back, elbows, sternum, mid traps, shoulders, sub-occipital. Gait is stable, no obvious antalgia. Posture actually quite good. Less TP's auscultation.  Psych: Pt's affect is appropriate. Pt is cooperative. She's pleasant and doesn't appear outwardly depressed.  Assessment & Plan:   1. FMS with associated symptoms including: chronic fatigue, depression, migraine headaches, RLS, TMJ, numerous tender points and trigger points.  2. Hx of major depression  3. Hypothyroid     Plan:  1. Zohydro will be initiated at 20mg  q12 #60. Will also give her norco 10/324 1/2 to 1 q12 prn #60. 2. She will continue with her psychological counseling. We can look at adjustments to her medications as well. May be useful to have a psychiatrist involved in her care as well.  3. Continue to focus on increased physical activity. I want her to work on more vigorous, aerobic exercise as well. I'm ok with her supplements  4. Made a referral to outpt PT to address posture, ROM, myofascial techniques, HEP.  5. Recheck T3,T4 in January or February.  6. Follow up with me in one months. 30 minutes of face to face patient care time were spent during this visit. All questions were encouraged and answered.

## 2013-02-25 ENCOUNTER — Telehealth: Payer: Self-pay

## 2013-02-25 DIAGNOSIS — G43009 Migraine without aura, not intractable, without status migrainosus: Secondary | ICD-10-CM

## 2013-02-25 DIAGNOSIS — IMO0001 Reserved for inherently not codable concepts without codable children: Secondary | ICD-10-CM

## 2013-02-25 MED ORDER — HYDROCODONE-ACETAMINOPHEN 10-325 MG PO TABS: 2.0000 | ORAL_TABLET | Freq: Three times a day (TID) | ORAL | Status: DC

## 2013-02-25 NOTE — Telephone Encounter (Signed)
Patient called and said she has taken Zohydro twice since yesterday,  and does not like the way it makes her feel. She said she feels sick, nauseated, dizzy, and wants to stay in bed. Patient is requesting to go back to her Hydrocodone. She said she would bring the Zohydro in for disposal. Please advise.

## 2013-02-25 NOTE — Telephone Encounter (Signed)
Contacted patient to inform her that her RX for Hydrocodone is ready for pickup. Patient must bring back Zohydro when picking up new prescription.

## 2013-02-25 NOTE — Telephone Encounter (Signed)
Have her bring it back in. i don't believe she gave it enough of a chance, however. A hydrocodone rx was filled

## 2013-03-25 ENCOUNTER — Encounter
Payer: PRIVATE HEALTH INSURANCE | Attending: Physical Medicine & Rehabilitation | Admitting: Physical Medicine & Rehabilitation

## 2013-03-25 ENCOUNTER — Encounter: Payer: Self-pay | Admitting: Physical Medicine & Rehabilitation

## 2013-03-25 VITALS — BP 111/71 | HR 89 | Resp 14 | Ht 64.0 in | Wt 137.4 lb

## 2013-03-25 DIAGNOSIS — E039 Hypothyroidism, unspecified: Secondary | ICD-10-CM

## 2013-03-25 DIAGNOSIS — F329 Major depressive disorder, single episode, unspecified: Secondary | ICD-10-CM

## 2013-03-25 DIAGNOSIS — IMO0001 Reserved for inherently not codable concepts without codable children: Secondary | ICD-10-CM

## 2013-03-25 DIAGNOSIS — F3289 Other specified depressive episodes: Secondary | ICD-10-CM

## 2013-03-25 DIAGNOSIS — G43009 Migraine without aura, not intractable, without status migrainosus: Secondary | ICD-10-CM

## 2013-03-25 MED ORDER — HYDROCODONE-ACETAMINOPHEN 10-325 MG PO TABS: 2.0000 | ORAL_TABLET | Freq: Three times a day (TID) | ORAL | Status: DC

## 2013-03-25 NOTE — Progress Notes (Signed)
Subjective:    Patient ID: Jenna Molina, female    DOB: 1955/09/23, 57 y.o.   MRN: [id number]  HPI  Jenna Molina is back regarding her chronic pain. She had a rough month after getting a cellulitis on her left wrist after a mole excision. She also developed a pneumonia which started as a bronchitis. This was treated at home.   She has been feeling more tired than usual ("chronic fatigue" as she calls it) since these illnesses.  She continues on her hydrocodone per home regimen.   She is planning on getting into some exercise classes including "hot" yoga.   Pain Inventory Average Pain 8 Pain Right Now 5 My pain is constant, sharp, dull, stabbing and aching  In the last 24 hours, has pain interfered with the following? General activity 7 Relation with others 7 Enjoyment of life 7 What TIME of day is your pain at its worst? evening Sleep (in general) Fair  Pain is worse with: walking, bending, sitting, standing and some activites Pain improves with: rest, heat/ice, pacing activities, medication and TENS Relief from Meds: 9  Mobility walk without assistance how many minutes can you walk? 10 ability to climb steps?  no do you drive?  yes transfers alone Do you have any goals in this area?  yes  Function disabled: date disabled na I need assistance with the following:  household duties and shopping Do you have any goals in this area?  no  Neuro/Psych dizziness confusion depression anxiety suicidal thoughts  Prior Studies Any changes since last visit?  no  Physicians involved in your care Any changes since last visit?  no   Family History  Problem Relation Age of Onset  . Cancer Mother   . Diabetes Mother   . Anxiety disorder Mother   . Emphysema Father   . Anxiety disorder Father   . Alcohol abuse Father   . COPD Father    History   Social History  . Marital Status: Married    Spouse Name: N/A    Number of Children: N/A  . Years of Education: N/A    Social History Main Topics  . Smoking status: Former Smoker    Quit date: 06/19/2008  . Smokeless tobacco: None  . Alcohol Use: No  . Drug Use: No  . Sexual Activity: None   Other Topics Concern  . None   Social History Narrative  . None   Past Surgical History  Procedure Laterality Date  . Inner ear surgery    . Abdominal hysterectomy    . Appendectomy    . Mastoidectomy    . Mole removal     Past Medical History  Diagnosis Date  . Migraines   . Fibromyalgia   . Cancer     Cervical cancer   BP 111/71  Pulse 89  Resp 14  Ht 5\' 4"  (1.626 m)  Wt 137 lb 6.4 oz (62.324 kg)  BMI 23.57 kg/m2  SpO2 95%      Review of Systems  Constitutional: Positive for appetite change.  Respiratory: Positive for cough, shortness of breath and wheezing.   Gastrointestinal: Positive for constipation.  Musculoskeletal: Positive for myalgias.  Neurological: Positive for dizziness.  Psychiatric/Behavioral: Positive for suicidal ideas, confusion and dysphoric mood. The patient is nervous/anxious.   All other systems reviewed and are negative.       Objective:   Physical Exam  General: Alert and oriented x 3, No apparent distress  HEENT: Head is normocephalic,  atraumatic, PERRLA, EOMI, sclera anicteric, oral mucosa pink and moist, dentition intact, ext ear canals clear,  Neck: Supple without JVD or lymphadenopathy  Heart: Reg rate and rhythm. No murmurs rubs or gallops  Chest: CTA bilaterally without wheezes, rales, or rhonchi; no distress  Abdomen: Soft, non-tender, non-distended, bowel sounds positive.  Extremities: No clubbing, cyanosis, or edema. Pulses are 2+  Skin: Clean and intact without signs of breakdown  Neuro: Pt is cognitively appropriate with normal insight, memory, and awareness. Cranial nerves 2-12 are intact. Sensory exam is normal. Reflexes are 2+ in all 4's. Fine motor coordination is intact. No tremors. Motor function is grossly 5/5. SLR is negative, but she  does have hamstring tightness bilaterally.  Musculoskeletal: can flex beyond 90 degrees-- better tolerance of all lumbar movement. Cervical ROM functional. Hamstrings are tight. Numerous tender points along knees, greater trochs, PSIS's, low back, elbows, sternum, mid traps, shoulders, sub-occipital. Gait is stable, no obvious antalgia. Posture actually quite good. Less TP's auscultation.  Psych: Pt's affect is appropriate. Pt is cooperative. She's pleasant and doesn't appear outwardly depressed.    Assessment & Plan:   1. FMS with associated symptoms including: chronic fatigue, depression, migraine headaches, RLS, TMJ, numerous tender points and trigger points.  2. Hx of major depression  3. Hypothyroid    Plan:  1. Refilled hydrocodone 10/325 one to 2 q8 prn #180. I gave her a second rx for January.  2. She will continue with her psychological counseling. We can look at adjustments to her medications as well. May be useful to have a psychiatrist involved in her care as well.  3. Continue to focus on increased physical activity. I think a regular routine and exercise will help with her fatigue. I still plan on rechecking T3,T4 in January or February. I think given all of her recent illness that there is sufficient reason to expect her energy levels to be off.  4. Follow up with me in two months. 30 minutes of face to face patient care time were spent during this visit. All questions were encouraged and answered.

## 2013-03-25 NOTE — Patient Instructions (Signed)
CALL ME WITH ANY PROBLEMS OR QUESTIONS (#[phone]).    HAPPY HOLIDAYS!!!!!   

## 2013-03-27 ENCOUNTER — Encounter: Payer: PRIVATE HEALTH INSURANCE | Admitting: Physical Medicine & Rehabilitation

## 2013-05-05 ENCOUNTER — Telehealth: Payer: Self-pay

## 2013-05-05 NOTE — Telephone Encounter (Signed)
Patient said she went to see her PCP(Dr. Sabra Heck) today 1/20 and she no longer wants to prescribe the patient Flexeril 10 mg tablets bid at bedtime to help reach ream sleep. Patient wants to know if you can start prescribing the medication for her? Patient states her PCP thinks she takes the Flexeril for spasms and she said that's not why she takes the medication.

## 2013-05-06 NOTE — Telephone Encounter (Signed)
i would be willing to presribe

## 2013-05-06 NOTE — Telephone Encounter (Signed)
Contacted patient to inform her that Dr. Naaman Plummer was willing to start prescribing. She has 6 pills left will need the new RX sent to [facility] on [address].

## 2013-05-07 ENCOUNTER — Other Ambulatory Visit: Payer: Self-pay

## 2013-05-07 MED ORDER — CYCLOBENZAPRINE HCL 10 MG PO TABS: 20.0000 mg | ORAL_TABLET | Freq: Every day | ORAL | Status: DC

## 2013-05-07 NOTE — Telephone Encounter (Signed)
Sent Flexeril Refill to pharmacy. Patient notified.

## 2013-05-07 NOTE — Telephone Encounter (Signed)
Flexeril refill sent to pharmacy.

## 2013-05-11 ENCOUNTER — Telehealth: Payer: Self-pay | Admitting: *Deleted

## 2013-05-11 NOTE — Telephone Encounter (Signed)
Nikki sent in a prescription for flexeril to pharmacy last week but it was for #30 and it should have been for #60.  Please correct.

## 2013-05-13 MED ORDER — CYCLOBENZAPRINE HCL 10 MG PO TABS: 20.0000 mg | ORAL_TABLET | Freq: Every day | ORAL | Status: DC

## 2013-05-13 NOTE — Telephone Encounter (Signed)
New order for flexeril sent to pharmacy.

## 2013-05-20 ENCOUNTER — Encounter: Payer: Self-pay | Admitting: Physical Medicine & Rehabilitation

## 2013-05-20 ENCOUNTER — Encounter
Payer: Managed Care, Other (non HMO) | Attending: Physical Medicine & Rehabilitation | Admitting: Physical Medicine & Rehabilitation

## 2013-05-20 VITALS — BP 111/70 | HR 91 | Resp 14 | Ht 64.0 in | Wt 137.0 lb

## 2013-05-20 DIAGNOSIS — F3289 Other specified depressive episodes: Secondary | ICD-10-CM | POA: Insufficient documentation

## 2013-05-20 DIAGNOSIS — IMO0001 Reserved for inherently not codable concepts without codable children: Secondary | ICD-10-CM | POA: Insufficient documentation

## 2013-05-20 DIAGNOSIS — F32A Depression, unspecified: Secondary | ICD-10-CM

## 2013-05-20 DIAGNOSIS — R51 Headache: Secondary | ICD-10-CM | POA: Insufficient documentation

## 2013-05-20 DIAGNOSIS — Z8542 Personal history of malignant neoplasm of other parts of uterus: Secondary | ICD-10-CM | POA: Insufficient documentation

## 2013-05-20 DIAGNOSIS — G43009 Migraine without aura, not intractable, without status migrainosus: Secondary | ICD-10-CM

## 2013-05-20 DIAGNOSIS — E039 Hypothyroidism, unspecified: Secondary | ICD-10-CM | POA: Insufficient documentation

## 2013-05-20 DIAGNOSIS — G2581 Restless legs syndrome: Secondary | ICD-10-CM | POA: Insufficient documentation

## 2013-05-20 DIAGNOSIS — F329 Major depressive disorder, single episode, unspecified: Secondary | ICD-10-CM

## 2013-05-20 MED ORDER — HYDROCODONE-ACETAMINOPHEN 10-325 MG PO TABS: 2.0000 | ORAL_TABLET | Freq: Three times a day (TID) | ORAL | Status: DC

## 2013-05-20 NOTE — Patient Instructions (Signed)
PLEASE CALL ME WITH ANY PROBLEMS OR QUESTIONS (#[phone]).      

## 2013-05-20 NOTE — Progress Notes (Signed)
Subjective:    Patient ID: Jenna Molina, female    DOB: 1956/02/23, 58 y.o.   MRN: [id number]  HPI  Mrs. Imel is back regarding her FMS. She is exercising at the [facility]. She is increasing her walking distance. She's up to a .25 mile over 8 hours on the treadmill. She has used the stepper as well. She has better activity tolerance, her mood has been better, etc. She feels that her energy levels are improving as a whole. She has enjoyed the people she has met at the gym as well.   On christmas day she hit her head hard on the garage door and suffered a contusion and a "ruptured vessel" in her eye. She suffered headaches for a few days as well.  Currently, she is only experiencing some sensitivity on the scalp where she hit her head.   She continues with her hydrocodone as prescribed. We are also rxing her flexeril which she takes two of at bed time, 33m.   Pain Inventory Average Pain 8 Pain Right Now 6 My pain is intermittent, sharp, stabbing and aching  In the last 24 hours, has pain interfered with the following? General activity 5 Relation with others 5 Enjoyment of life 5 What TIME of day is your pain at its worst? morning, evening Sleep (in general) Fair  Pain is worse with: activity Pain improves with: rest Relief from Meds: 5  Mobility walk without assistance do you drive?  yes transfers alone Do you have any goals in this area?  yes  Function disabled: date disabled na I need assistance with the following:  household duties  Neuro/Psych No problems in this area  Prior Studies Any changes since last visit?  no  Physicians involved in your care Any changes since last visit?  no   Family History  Problem Relation Age of Onset  . Cancer Mother   . Diabetes Mother   . Anxiety disorder Mother   . Emphysema Father   . Anxiety disorder Father   . Alcohol abuse Father   . COPD Father    History   Social History  . Marital Status: Married    Spouse Name:  N/A    Number of Children: N/A  . Years of Education: N/A   Social History Main Topics  . Smoking status: Former Smoker    Quit date: 06/19/2008  . Smokeless tobacco: None  . Alcohol Use: No  . Drug Use: No  . Sexual Activity: None   Other Topics Concern  . None   Social History Narrative  . None   Past Surgical History  Procedure Laterality Date  . Inner ear surgery    . Abdominal hysterectomy    . Appendectomy    . Mastoidectomy    . Mole removal     Past Medical History  Diagnosis Date  . Migraines   . Fibromyalgia   . Cancer     Cervical cancer   BP 111/70  Pulse 91  Resp 14  Ht _0  (1.626 m)  Wt 137 lb (62.143 kg)  BMI 23.50 kg/m2  SpO2 95%  Opioid Risk Score: 2 Fall Risk Score: Moderate Fall Risk (6-13 points) (pt educated on fall risk, brochure given to pt.)   Review of Systems  Musculoskeletal: Positive for back pain.  All other systems reviewed and are negative.       Objective:   Physical Exam General: Alert and oriented x 3, No apparent distress  HEENT: Head  is normocephalic, atraumatic, PERRLA, EOMI, sclera anicteric, oral mucosa pink and moist, dentition intact, ext ear canals clear,  Neck: Supple without JVD or lymphadenopathy  Heart: Reg rate and rhythm. No murmurs rubs or gallops  Chest: CTA bilaterally without wheezes, rales, or rhonchi; no distress  Abdomen: Soft, non-tender, non-distended, bowel sounds positive.  Extremities: No clubbing, cyanosis, or edema. Pulses are 2+  Skin: Clean and intact without signs of breakdown  Neuro: Pt is cognitively appropriate with normal insight, memory, and awareness. Cranial nerves 2-12 are intact. Sensory exam is normal. Reflexes are 2+ in all 4's. Fine motor coordination is intact. No tremors. Motor function is grossly 5/5. SLR is negative, but she does have hamstring tightness bilaterally.  Musculoskeletal: can flex beyond 90 degrees--. Cervical ROM functional. Hamstrings are tight. Numerous  tender points along knees, greater trochs, PSIS's, low back, elbows, sternum, mid traps, shoulders, sub-occipital. Gait is stable, no obvious antalgia. Right foot without spasms or pain. i felt no catches with flexion or extension of her toes.  Psych: Pt's affect is appropriate. Pt is cooperative. Energy levels appear good.  Assessment & Plan:   1. FMS with associated symptoms including: chronic fatigue, depression, migraine headaches, RLS, TMJ, numerous tender points and trigger points. She has some cramping it appears in the right foot.  2. Hx of major depression  3. Hypothyroid    Plan:  1. Refilled hydrocodone 10/325 one to 2 q8 prn #180. I gave her a second rx for January.  2. Psychology follow up as directed  3. Continue with exercise program. I am pleased with her activity levels and progress. 4. Check thyroid levels, B12, vitamin d levels this spring.  5. Follow up with me in two months. 30 minutes of face to face patient care time were spent during this visit. All questions were encouraged and answered.

## 2013-05-22 ENCOUNTER — Encounter: Payer: Self-pay | Admitting: Physical Medicine & Rehabilitation

## 2013-07-14 ENCOUNTER — Encounter
Payer: PRIVATE HEALTH INSURANCE | Attending: Physical Medicine & Rehabilitation | Admitting: Physical Medicine & Rehabilitation

## 2013-07-14 ENCOUNTER — Encounter: Payer: Self-pay | Admitting: Physical Medicine & Rehabilitation

## 2013-07-14 VITALS — BP 112/67 | HR 91 | Resp 14 | Ht 64.0 in | Wt 130.0 lb

## 2013-07-14 DIAGNOSIS — R5382 Chronic fatigue, unspecified: Secondary | ICD-10-CM | POA: Diagnosis present

## 2013-07-14 DIAGNOSIS — G9332 Myalgic encephalomyelitis/chronic fatigue syndrome: Secondary | ICD-10-CM | POA: Insufficient documentation

## 2013-07-14 DIAGNOSIS — F32A Depression, unspecified: Secondary | ICD-10-CM

## 2013-07-14 DIAGNOSIS — E039 Hypothyroidism, unspecified: Secondary | ICD-10-CM

## 2013-07-14 DIAGNOSIS — F3289 Other specified depressive episodes: Secondary | ICD-10-CM | POA: Diagnosis present

## 2013-07-14 DIAGNOSIS — F329 Major depressive disorder, single episode, unspecified: Secondary | ICD-10-CM | POA: Diagnosis present

## 2013-07-14 DIAGNOSIS — R5381 Other malaise: Secondary | ICD-10-CM | POA: Insufficient documentation

## 2013-07-14 DIAGNOSIS — E559 Vitamin D deficiency, unspecified: Secondary | ICD-10-CM | POA: Insufficient documentation

## 2013-07-14 DIAGNOSIS — IMO0001 Reserved for inherently not codable concepts without codable children: Secondary | ICD-10-CM | POA: Diagnosis present

## 2013-07-14 DIAGNOSIS — G43009 Migraine without aura, not intractable, without status migrainosus: Secondary | ICD-10-CM

## 2013-07-14 DIAGNOSIS — E673 Hypervitaminosis D: Secondary | ICD-10-CM | POA: Diagnosis present

## 2013-07-14 MED ORDER — HYDROCODONE-ACETAMINOPHEN 10-325 MG PO TABS: 2.0000 | ORAL_TABLET | Freq: Three times a day (TID) | ORAL | Status: DC

## 2013-07-14 NOTE — Patient Instructions (Signed)
Dr. Carlean Purl [facility]--- [facility]  Dr. Mikki Santee Buccini----[facility]   PLEASE CALL ME WITH ANY PROBLEMS OR QUESTIONS [phone]).

## 2013-07-14 NOTE — Progress Notes (Signed)
Subjective:    Patient ID: Jenna Molina, female    DOB: Dec 10, 1955, 58 y.o.   MRN: [id number]  HPI  Jenna Molina is back regarding her FMS. She has been working out regularly 3x per week. She has lost 7lbs. She is feeling better and eating better as well.   She continues with her pain medications but doesn't feel that her back pain is as severe as before, although the same problem spots remain. She really feels the exercise has helped there as well.   Otherwise she doesn't have any new complaints. We had discussed at last visit about checking labwork today.    Pain Inventory Average Pain 7 Pain Right Now 7 My pain is constant, sharp, stabbing and aching  In the last 24 hours, has pain interfered with the following? General activity 7 Relation with others 7 Enjoyment of life 7 What TIME of day is your pain at its worst? evening Sleep (in general) Fair  Pain is worse with: walking, bending, sitting, standing and some activites Pain improves with: rest, heat/ice, therapy/exercise, medication and TENS Relief from Meds: 6  Mobility walk without assistance how many minutes can you walk? 15 ability to climb steps?  yes do you drive?  yes Do you have any goals in this area?  yes  Function disabled: date disabled 07/2004 I need assistance with the following:  household duties and shopping Do you have any goals in this area?  no  Neuro/Psych depression anxiety  Prior Studies Any changes since last visit?  no  Physicians involved in your care in process of changing providers   Family History  Problem Relation Age of Onset  . Cancer Mother   . Diabetes Mother   . Anxiety disorder Mother   . Emphysema Father   . Anxiety disorder Father   . Alcohol abuse Father   . COPD Father    History   Social History  . Marital Status: Married    Spouse Name: N/A    Number of Children: N/A  . Years of Education: N/A   Social History Main Topics  . Smoking status: Former  Smoker    Quit date: 06/19/2008  . Smokeless tobacco: None  . Alcohol Use: No  . Drug Use: No  . Sexual Activity: None   Other Topics Concern  . None   Social History Narrative  . None   Past Surgical History  Procedure Laterality Date  . Inner ear surgery    . Abdominal hysterectomy    . Appendectomy    . Mastoidectomy    . Mole removal     Past Medical History  Diagnosis Date  . Migraines   . Fibromyalgia   . Cancer     Cervical cancer   BP 112/67  Pulse 91  Resp 14  Ht 5\' 4"  (1.626 m)  Wt 130 lb (58.968 kg)  BMI 22.30 kg/m2  SpO2 98%  Opioid Risk Score:   Fall Risk Score: Low Fall Risk (0-5 points) (patient educated handout declined)   Review of Systems  Musculoskeletal: Positive for arthralgias, back pain and myalgias.  Psychiatric/Behavioral: Positive for dysphoric mood. The patient is nervous/anxious.   All other systems reviewed and are negative.       Objective:   Physical Exam General: Alert and oriented x 3, No apparent distress  HEENT: Head is normocephalic, atraumatic, PERRLA, EOMI, sclera anicteric, oral mucosa pink and moist, dentition intact, ext ear canals clear,  Neck: Supple without JVD or  lymphadenopathy  Heart: Reg rate and rhythm. No murmurs rubs or gallops  Chest: CTA bilaterally without wheezes, rales, or rhonchi; no distress  Abdomen: Soft, non-tender, non-distended, bowel sounds positive.  Extremities: No clubbing, cyanosis, or edema. Pulses are 2+  Skin: Clean and intact without signs of breakdown  Neuro: Pt is cognitively appropriate with normal insight, memory, and awareness. Cranial nerves 2-12 are intact. Sensory exam is normal. Reflexes are 2+ in all 4's. Fine motor coordination is intact. No tremors. Motor function is grossly 5/5. SLR is negative, but she does have hamstring tightness bilaterally.  Musculoskeletal: can flex beyond 90 degrees+. Cervical ROM functional. Hamstrings are more flexible. Numerous tender points along  knees, greater trochs, PSIS's, low back, elbows, sternum, mid traps, shoulders, sub-occipital all appear less severe today. Gait is stable, normal. Right foot without spasms or pain.   Psych: Pt's affect is appropriate. Pt is cooperative. Very pleasant   Assessment & Plan:   1. FMS with associated symptoms including: chronic fatigue, depression, migraine headaches, RLS, TMJ, numerous tender points and trigger points.    2. Hx of major depression  3. Hypothyroid    Plan:  1. Refilled hydrocodone 10/325 one to 2 q8 prn #180. I gave her a second rx for May.  2. Psychology follow up as directed  3. Continue with exercise program. I am pleased with her activity levels and progress. She is seeing the results as well 4. Check thyroid levels, vitamin D and LFT's today. .  5. Follow up with NP in two months. 30 minutes of face to face patient care time were spent during this visit. All questions were encouraged and answered.

## 2013-07-15 LAB — TSH: TSH: 2.701 u[IU]/mL (ref 0.350–4.500)

## 2013-07-19 LAB — VITAMIN D 1,25 DIHYDROXY
VITAMIN D3 1, 25 (OH): 53 pg/mL
Vitamin D 1, 25 (OH)2 Total: 53 pg/mL (ref 18–72)

## 2013-07-21 ENCOUNTER — Telehealth: Payer: Self-pay | Admitting: Physical Medicine & Rehabilitation

## 2013-07-21 NOTE — Telephone Encounter (Signed)
TSH and vitamin d ok.  Still waiting on other labs.  Please let pt know and can we find out results of other labs?. thanks

## 2013-07-22 NOTE — Telephone Encounter (Signed)
Contacted [facility] to check on results of other labs. Randell Loop is faxing results for T3 and T4, but hepatic function panel was not ordered per [employer]. I asked them to add the test on and the blood was discarded on 07/22/13.

## 2013-07-22 NOTE — Telephone Encounter (Deleted)
Patient returned call. Informed patient that her TSH and Vitamin D lab results were normal per Dr. Naaman Plummer.

## 2013-07-22 NOTE — Telephone Encounter (Addendum)
Attempted to contact patient regarding lab results. Left a voicemail for patient to return call to clinic. Patient returned call. Informed patient that her TSH and Vitamin D lab results were normal per Dr. Naaman Plummer.

## 2013-07-27 ENCOUNTER — Encounter: Payer: Self-pay | Admitting: Physical Medicine & Rehabilitation

## 2013-08-04 ENCOUNTER — Telehealth: Payer: Self-pay

## 2013-08-04 NOTE — Telephone Encounter (Signed)
Patient called to let us know that she got her heart rate up to 130.  She was excited about this until she got a headache with mild pressure.  This has since subsided but wanted to make [place] aware.

## 2013-08-07 NOTE — Telephone Encounter (Signed)
Left message to check on patient.  Told her to go to ER if symptoms worsen.

## 2013-09-11 ENCOUNTER — Encounter
Payer: Managed Care, Other (non HMO) | Attending: Physical Medicine & Rehabilitation | Admitting: Physical Medicine & Rehabilitation

## 2013-09-11 ENCOUNTER — Encounter: Payer: Self-pay | Admitting: Physical Medicine & Rehabilitation

## 2013-09-11 VITALS — BP 104/63 | HR 76 | Wt 129.4 lb

## 2013-09-11 DIAGNOSIS — IMO0001 Reserved for inherently not codable concepts without codable children: Secondary | ICD-10-CM | POA: Insufficient documentation

## 2013-09-11 DIAGNOSIS — R5382 Chronic fatigue, unspecified: Secondary | ICD-10-CM

## 2013-09-11 DIAGNOSIS — G43009 Migraine without aura, not intractable, without status migrainosus: Secondary | ICD-10-CM | POA: Insufficient documentation

## 2013-09-11 DIAGNOSIS — F329 Major depressive disorder, single episode, unspecified: Secondary | ICD-10-CM

## 2013-09-11 DIAGNOSIS — F32A Depression, unspecified: Secondary | ICD-10-CM

## 2013-09-11 DIAGNOSIS — R5381 Other malaise: Secondary | ICD-10-CM

## 2013-09-11 DIAGNOSIS — R7989 Other specified abnormal findings of blood chemistry: Secondary | ICD-10-CM

## 2013-09-11 DIAGNOSIS — G9332 Myalgic encephalomyelitis/chronic fatigue syndrome: Secondary | ICD-10-CM | POA: Insufficient documentation

## 2013-09-11 DIAGNOSIS — R945 Abnormal results of liver function studies: Secondary | ICD-10-CM

## 2013-09-11 DIAGNOSIS — F3289 Other specified depressive episodes: Secondary | ICD-10-CM

## 2013-09-11 DIAGNOSIS — E559 Vitamin D deficiency, unspecified: Secondary | ICD-10-CM

## 2013-09-11 DIAGNOSIS — E039 Hypothyroidism, unspecified: Secondary | ICD-10-CM

## 2013-09-11 MED ORDER — HYDROCODONE-ACETAMINOPHEN 10-325 MG PO TABS: 2.0000 | ORAL_TABLET | Freq: Three times a day (TID) | ORAL | Status: DC

## 2013-09-11 NOTE — Patient Instructions (Signed)
PLEASE CALL ME WITH ANY PROBLEMS OR QUESTIONS (#[phone]).      

## 2013-09-11 NOTE — Progress Notes (Signed)
Subjective:    Patient ID: Jenna Molina, female    DOB: May 27, 1955, 58 y.o.   MRN: [id number]  HPI   Jenna Molina is back regarding her chronic pain. She has been working on exercise and diet to improve her cholesterol levels. She has developed pain in her left mid foot. She relates it to walking barefoot at home or with her flipflops.   She has tried to continue with her exercise. She is on the eliptical three times a week. Her HR will get up to 150+ when she is really exerting. Her basal HR has been in the 70's. She is not complaining of SOB or dizziness when her HR is higher during exercise  She continues on hydrocodone for pain control. She typically uses 5-6 per day.   Pain Inventory Average Pain 9 Pain Right Now 7 My pain is constant  In the last 24 hours, has pain interfered with the following? General activity 8 Relation with others 8 Enjoyment of life 10 What TIME of day is your pain at its worst? evening Sleep (in general) Fair  Pain is worse with: walking, bending, sitting and standing Pain improves with: rest, heat/ice, therapy/exercise, pacing activities, medication and TENS Relief from Meds: 5  Mobility walk with assistance ability to climb steps?  yes do you drive?  yes  Function disabled: date disabled .  Neuro/Psych depression anxiety suicidal thoughts  No plan.  Just random thoughts .    Prior Studies Any changes since last visit?  no  Physicians involved in your care Any changes since last visit?  no   Family History  Problem Relation Age of Onset  . Cancer Mother   . Diabetes Mother   . Anxiety disorder Mother   . Emphysema Father   . Anxiety disorder Father   . Alcohol abuse Father   . COPD Father    History   Social History  . Marital Status: Married    Spouse Name: N/A    Number of Children: N/A  . Years of Education: N/A   Social History Main Topics  . Smoking status: Former Smoker    Quit date: 06/19/2008  . Smokeless tobacco:  None  . Alcohol Use: No  . Drug Use: No  . Sexual Activity: None   Other Topics Concern  . None   Social History Narrative  . None   Past Surgical History  Procedure Laterality Date  . Inner ear surgery    . Abdominal hysterectomy    . Appendectomy    . Mastoidectomy    . Mole removal     Past Medical History  Diagnosis Date  . Migraines   . Fibromyalgia   . Cancer     Cervical cancer   BP 104/63  Pulse 76  Wt 129 lb 6.4 oz (58.695 kg)  SpO2 97%  Opioid Risk Score:   Fall Risk Score: Low Fall Risk (0-5 points) (educated and handout for fall prevention in the home declined at previous visit) Review of Systems  Musculoskeletal: Positive for back pain.       Right foot pain  Psychiatric/Behavioral: Positive for suicidal ideas and dysphoric mood. The patient is nervous/anxious.        No active plan.  These are just random thoughts and she says she is ok.  All other systems reviewed and are negative.      Objective:   Physical Exam General: Alert and oriented x 3, No apparent distress  HEENT: Head is normocephalic,  atraumatic, PERRLA, EOMI, sclera anicteric, oral mucosa pink and moist, dentition intact, ext ear canals clear,  Neck: Supple without JVD or lymphadenopathy  Heart: Reg rate and rhythm. No murmurs rubs or gallops  Chest: CTA bilaterally without wheezes, rales, or rhonchi; no distress  Abdomen: Soft, non-tender, non-distended, bowel sounds positive.  Extremities: No clubbing, cyanosis, or edema. Pulses are 2+  Skin: Clean and intact without signs of breakdown  Neuro: Pt is cognitively appropriate with normal insight, memory, and awareness. Cranial nerves 2-12 are intact. Sensory exam is normal. Reflexes are 2+ in all 4's. Fine motor coordination is intact. No tremors. Motor function is grossly 5/5. SLR is negative, but she does have hamstring tightness bilaterally.  Musculoskeletal: can flex beyond 90 degrees+. Cervical ROM functional. Hamstrings are more  flexible. Numerous tender points along knees, greater trochs, PSIS's, low back, elbows, sternum, mid traps, shoulders, sub-occipital all appear less severe today. Gait is stable, normal. Left tender across tarsal-metatarsal area, worse with passive DF and stretching of arch Psych: Pt's affect is appropriate. Pt is cooperative. Very pleasant  Assessment & Plan:   1. FMS with associated symptoms including: chronic fatigue, depression, migraine headaches, RLS, TMJ, numerous tender points and trigger points.  2. Hx of major depression  3. Hypothyroid    Plan:  1. Refilled hydrocodone 10/325 one to 2 q8 prn #180. I gave her a second rx for july.  2. Psychology follow up pending her finding a provider. I gave her a couple names today 3. Continue with exercise program. We discussed her HR with exercise. I am ok with her HR being in the 150's during her eliptical exercise. It hasn't been elevated at rest. She is not having any symptoms while exercising also.  4. Thyroid and vitamin d levels were ok. Will check LFT's today as we had discussed last year.  5. Follow up with me in two months. 30 minutes of face to face patient care time were spent during this visit. All questions were encouraged and answered.

## 2013-09-12 LAB — HEPATIC FUNCTION PANEL
ALT: 31 U/L (ref 0–35)
AST: 20 U/L (ref 0–37)
Albumin: 4.7 g/dL (ref 3.5–5.2)
Alkaline Phosphatase: 62 U/L (ref 39–117)
Total Bilirubin: 0.3 mg/dL (ref 0.2–1.2)
Total Protein: 6.5 g/dL (ref 6.0–8.3)

## 2013-09-15 ENCOUNTER — Telehealth: Payer: Self-pay | Admitting: Physical Medicine & Rehabilitation

## 2013-09-15 NOTE — Telephone Encounter (Signed)
Left message advising patient her labs were normal.

## 2013-09-15 NOTE — Telephone Encounter (Signed)
Please let Jenna Molina know that LFT's are all normal

## 2013-10-28 ENCOUNTER — Telehealth: Payer: Self-pay

## 2013-10-28 NOTE — Telephone Encounter (Signed)
Informed patient to contact Dr Toy Care for psychiatric care.  Phone number given.

## 2013-10-28 NOTE — Telephone Encounter (Signed)
Patient is requesting a recommendation for a psychiatrist. Patient states it is okay to leave a voicemail with the names of the psychiatrist on her voicemail.  Please advise

## 2013-10-28 NOTE — Telephone Encounter (Signed)
Rupinder Toy [facility]

## 2013-11-10 ENCOUNTER — Encounter
Payer: PRIVATE HEALTH INSURANCE | Attending: Physical Medicine & Rehabilitation | Admitting: Physical Medicine & Rehabilitation

## 2013-11-10 ENCOUNTER — Encounter: Payer: Self-pay | Admitting: Physical Medicine & Rehabilitation

## 2013-11-10 VITALS — BP 103/62 | HR 78 | Resp 14 | Ht 64.0 in | Wt 126.0 lb

## 2013-11-10 DIAGNOSIS — F329 Major depressive disorder, single episode, unspecified: Secondary | ICD-10-CM

## 2013-11-10 DIAGNOSIS — F3289 Other specified depressive episodes: Secondary | ICD-10-CM

## 2013-11-10 DIAGNOSIS — F32A Depression, unspecified: Secondary | ICD-10-CM

## 2013-11-10 DIAGNOSIS — G43001 Migraine without aura, not intractable, with status migrainosus: Secondary | ICD-10-CM | POA: Insufficient documentation

## 2013-11-10 DIAGNOSIS — IMO0001 Reserved for inherently not codable concepts without codable children: Secondary | ICD-10-CM | POA: Insufficient documentation

## 2013-11-10 MED ORDER — HYDROCODONE-ACETAMINOPHEN 10-325 MG PO TABS: 2.0000 | ORAL_TABLET | Freq: Three times a day (TID) | ORAL | Status: DC

## 2013-11-10 NOTE — Progress Notes (Signed)
Subjective:    Patient ID: Jenna Molina, female    DOB: 1955/06/24, 58 y.o.   MRN: [id number]  HPI  Mrs. Suchocki is back regarding her chronic pain. She has remained active with her exercise program. She is up to 2.68miles of walking. She is also doing core work and Editor, commissioning. She is up to almost 73minutes on the eliptical---her HR maxes out at 160. While she's pleased with her progress, she is not convinced that her stamina has improved on a day in and out basis. She does feel that her mood is better due to the social interactions at the gym. She has psychology follow up next month I believe.  Unita maintains on the hydrocodone which seems to still work well for her pain.  Pain Inventory Average Pain 6 Pain Right Now 6 My pain is constant, sharp, burning, dull, stabbing and aching  In the last 24 hours, has pain interfered with the following? General activity 7 Relation with others 8 Enjoyment of life 8 What TIME of day is your pain at its worst? evening Sleep (in general) Fair  Pain is worse with: walking and standing Pain improves with: rest, heat/ice and medication Relief from Meds: 7  Mobility ability to climb steps?  yes do you drive?  yes Do you have any goals in this area?  yes  Function disabled: date disabled .  Neuro/Psych bowel control problems depression anxiety  Prior Studies Any changes since last visit?  no  Physicians involved in your care Any changes since last visit?  no   Family History  Problem Relation Age of Onset  . Cancer Mother   . Diabetes Mother   . Anxiety disorder Mother   . Emphysema Father   . Anxiety disorder Father   . Alcohol abuse Father   . COPD Father    History   Social History  . Marital Status: Married    Spouse Name: N/A    Number of Children: N/A  . Years of Education: N/A   Social History Main Topics  . Smoking status: Former Smoker    Quit date: 06/19/2008  . Smokeless tobacco: None  . Alcohol Use:  No  . Drug Use: No  . Sexual Activity: None   Other Topics Concern  . None   Social History Narrative  . None   Past Surgical History  Procedure Laterality Date  . Inner ear surgery    . Abdominal hysterectomy    . Appendectomy    . Mastoidectomy    . Mole removal     Past Medical History  Diagnosis Date  . Migraines   . Fibromyalgia   . Cancer     Cervical cancer   BP 103/62  Pulse 78  Resp 14  Ht 5\' 4"  (1.626 m)  Wt 126 lb (57.153 kg)  BMI 21.62 kg/m2  SpO2 97%  Opioid Risk Score:   Fall Risk Score: Low Fall Risk (0-5 points) (patient educated handout declined)   Review of Systems  Gastrointestinal: Positive for constipation.  Musculoskeletal: Positive for back pain.  Psychiatric/Behavioral: Positive for dysphoric mood. The patient is nervous/anxious.   All other systems reviewed and are negative.      Objective:   Physical Exam  General: Alert and oriented x 3, No apparent distress  HEENT: Head is normocephalic, atraumatic, PERRLA, EOMI, sclera anicteric, oral mucosa pink and moist, dentition intact, ext ear canals clear,  Neck: Supple without JVD or lymphadenopathy  Heart: Reg rate and  rhythm. No murmurs rubs or gallops  Chest: CTA bilaterally without wheezes, rales, or rhonchi; no distress  Abdomen: Soft, non-tender, non-distended, bowel sounds positive.  Extremities: No clubbing, cyanosis, or edema. Pulses are 2+  Skin: Clean and intact without signs of breakdown  Neuro: Pt is cognitively appropriate with normal insight, memory, and awareness. Cranial nerves 2-12 are intact. Sensory exam is normal. Reflexes are 2+ in all 4's. Fine motor coordination is intact. No tremors. Motor function is grossly 5/5. SLR is negative, but she does have hamstring tightness bilaterally.  Musculoskeletal: good lumbar flexion. Cervical ROM functional. Hamstrings are more flexible. Numerous tender points along knees, greater trochs, PSIS's, low back, elbows, sternum, mid  traps, shoulders, sub-occipital all appear less severe today. Gait is stable, normal. Arms appear more toned.  Psych: Pt's affect is appropriate. Pt is cooperative. Very pleasant. Spirits better.    Assessment & Plan:   1. FMS with associated symptoms including: chronic fatigue, depression, migraine headaches, RLS, TMJ, numerous tender points and trigger points.  2. Hx of major depression  3. Hypothyroid    Plan:  1. Refilled hydrocodone 10/325 one to 2 q8 prn #180. I gave her a second rx for September.  2. Psychology follow up soon.     3. Maintain exercise program as she's doing. I think her energy levels and mental health are both improving. 4. LFT's ok at last visit.   5. Follow up with me in two months. 30 minutes of face to face patient care time were spent during this visit. All questions were encouraged and answered.

## 2013-11-10 NOTE — Patient Instructions (Signed)
PLEASE CALL ME WITH ANY PROBLEMS OR QUESTIONS (#[phone]).      

## 2013-12-25 ENCOUNTER — Other Ambulatory Visit: Payer: Self-pay | Admitting: Physical Medicine & Rehabilitation

## 2013-12-29 ENCOUNTER — Encounter: Payer: Self-pay | Admitting: Physical Medicine & Rehabilitation

## 2013-12-29 ENCOUNTER — Encounter
Payer: PRIVATE HEALTH INSURANCE | Attending: Physical Medicine & Rehabilitation | Admitting: Physical Medicine & Rehabilitation

## 2013-12-29 VITALS — BP 122/67 | HR 65 | Resp 14 | Wt 126.4 lb

## 2013-12-29 DIAGNOSIS — K589 Irritable bowel syndrome without diarrhea: Secondary | ICD-10-CM | POA: Insufficient documentation

## 2013-12-29 DIAGNOSIS — IMO0001 Reserved for inherently not codable concepts without codable children: Secondary | ICD-10-CM | POA: Diagnosis present

## 2013-12-29 DIAGNOSIS — G43001 Migraine without aura, not intractable, with status migrainosus: Secondary | ICD-10-CM | POA: Insufficient documentation

## 2013-12-29 DIAGNOSIS — G43009 Migraine without aura, not intractable, without status migrainosus: Secondary | ICD-10-CM

## 2013-12-29 DIAGNOSIS — R5381 Other malaise: Secondary | ICD-10-CM

## 2013-12-29 DIAGNOSIS — R5382 Chronic fatigue, unspecified: Secondary | ICD-10-CM

## 2013-12-29 DIAGNOSIS — G9332 Myalgic encephalomyelitis/chronic fatigue syndrome: Secondary | ICD-10-CM

## 2013-12-29 MED ORDER — HYDROCODONE-ACETAMINOPHEN 10-325 MG PO TABS: 2.0000 | ORAL_TABLET | Freq: Three times a day (TID) | ORAL | Status: DC

## 2013-12-29 MED ORDER — CYCLOBENZAPRINE HCL 10 MG PO TABS: 20.0000 mg | ORAL_TABLET | Freq: Every day | ORAL | Status: DC

## 2013-12-29 NOTE — Patient Instructions (Signed)
SENNA-S: 2 AT NIGHT   -INCREASE TO TWICE DAILY OR 4 AT NIGHT IF NEEDED  PROBIOTIC DAILY---[place]   [place] AS NEEDED FOR CONSTIPATION----MAYBE SOMETHING YOU TAKE ON SCHEDULE DEPENDING UPON HOW YOUR RESPOND TO THE ABOVE.

## 2013-12-29 NOTE — Progress Notes (Signed)
Subjective:    Patient ID: Jenna Molina, female    DOB: 1956/02/04, 58 y.o.   MRN: [id number]  HPI  Jenna Molina is back regarding her chronic pain. She states that she's been having more abdominal pain and nausea. She is having constipation. She has a colonoscopy and EGD scheduled for later this month.   Her migraines have increased. She had 4 last week, one this morning. Her imitrex is effective for stopping the headaches.  She continues on topamax for prophylaxis.   She has continued exercising. She is up to about 2.25 miles on her exercise days. She keeps track of her HR while she walks as well.     Pain Inventory Average Pain 6 Pain Right Now 6 My pain is sharp, dull, stabbing and aching  In the last 24 hours, has pain interfered with the following? General activity 8 Relation with others 8 Enjoyment of life 8 What TIME of day is your pain at its worst? morning evening and night Sleep (in general) Poor  Pain is worse with: walking, bending, sitting and standing Pain improves with: rest, heat/ice, pacing activities and medication Relief from Meds: 5  Mobility do you drive?  yes  Function disabled: date disabled . I need assistance with the following:  household duties  Neuro/Psych depression anxiety  Prior Studies Any changes since last visit?  no  Physicians involved in your care Any changes since last visit?  no   Family History  Problem Relation Age of Onset  . Cancer Mother   . Diabetes Mother   . Anxiety disorder Mother   . Emphysema Father   . Anxiety disorder Father   . Alcohol abuse Father   . COPD Father    History   Social History  . Marital Status: Married    Spouse Name: N/A    Number of Children: N/A  . Years of Education: N/A   Social History Main Topics  . Smoking status: Former Smoker    Quit date: 06/19/2008  . Smokeless tobacco: None  . Alcohol Use: No  . Drug Use: No  . Sexual Activity: None   Other Topics Concern  . None    Social History Narrative  . None   Past Surgical History  Procedure Laterality Date  . Inner ear surgery    . Abdominal hysterectomy    . Appendectomy    . Mastoidectomy    . Mole removal     Past Medical History  Diagnosis Date  . Migraines   . Fibromyalgia   . Cancer     Cervical cancer   BP 122/67  Pulse 65  Resp 14  Wt 126 lb 6.4 oz (57.335 kg)  SpO2 99%  Opioid Risk Score:   Fall Risk Score: Low Fall Risk (0-5 points) (previously educated and given handout) Review of Systems  Constitutional: Positive for appetite change.  Gastrointestinal: Positive for nausea and abdominal pain.  Psychiatric/Behavioral: Positive for dysphoric mood. The patient is nervous/anxious.   All other systems reviewed and are negative.      Objective:   Physical Exam  General: Alert and oriented x 3, No apparent distress  HEENT: Head is normocephalic, atraumatic, PERRLA, EOMI, sclera anicteric, oral mucosa pink and moist, dentition intact, ext ear canals clear,  Neck: Supple without JVD or lymphadenopathy  Heart: Reg rate and rhythm. No murmurs rubs or gallops  Chest: CTA bilaterally without wheezes, rales, or rhonchi; no distress  Abdomen: Slightly distended. Mild epigastric pain with palpation  Extremities: No clubbing, cyanosis, or edema. Pulses are 2+  Skin: Clean and intact without signs of breakdown  Neuro: Pt is cognitively appropriate with normal insight, memory, and awareness. Cranial nerves 2-12 are intact. Sensory exam is normal. Reflexes are 2+ in all 4's. Fine motor coordination is intact. No tremors. Motor function is grossly 5/5. SLR is negative, but she does have hamstring tightness bilaterally.  Musculoskeletal: good lumbar flexion. Cervical ROM functional. Hamstrings are more flexible. Numerous tender points along knees, greater trochs, PSIS's, low back, elbows, sternum, mid traps, shoulders, sub-occipital all appear less severe today. Gait is stable, normal.    Psych:  Pt's affect is appropriate. Pt is cooperative. Very pleasant. Spirits better  .  Assessment & Plan:   1. FMS with associated symptoms including: chronic fatigue, depression, migraine headaches, RLS, TMJ, numerous tender points and trigger points.  2. Hx of major depression  3. Hypothyroid     Plan:  1. Refilled hydrocodone 10/325 one to 2 q8 prn #180. I gave her a second rx for November.  2. Psychology follow up as directed.  3. Maintain exercise program as she's doing. I think her energy levels and mental health are both improving.  4. GI work up as noted. Discussed bowel program, mgt of IBS in length today 5. Follow up with me in two months. 30 minutes of face to face patient care time were spent during this visit. All questions were encouraged and answered.

## 2014-01-12 ENCOUNTER — Ambulatory Visit: Payer: Managed Care, Other (non HMO) | Admitting: Physical Medicine & Rehabilitation

## 2014-02-05 ENCOUNTER — Telehealth: Payer: Self-pay | Admitting: *Deleted

## 2014-02-05 NOTE — Telephone Encounter (Signed)
Her pain medication had been working for some time prior to her husband's illness.. That would suggest to me that this is more of a stress/?anxiety thing more than a pain problem. Perhaps something for anxiety would be more appropriate.  i would suggest xanax 0.25mg  q8 prn #30

## 2014-02-05 NOTE — Telephone Encounter (Signed)
Pt called stating that her rx for pain is not helping. She is hoping for an increase. Her husband has been hospitalized at [facility] for the las two weeks andnd she has been traveling back and forth to the hospital to care for her husband. Please Call and advise

## 2014-02-08 NOTE — Telephone Encounter (Signed)
Patient declined the Xanax and clearly asked for a temporary increase in the pain meds.  I tried to talk to her about the body's response to stress - she stated that it would not be permanent addition, just while she is dealing with all the stress.  Already taking Ativan QID and does not want to increase that. Please advise

## 2014-02-08 NOTE — Telephone Encounter (Signed)
Patient called a 2nd time hoping for a call back

## 2014-02-09 NOTE — Telephone Encounter (Signed)
I will not give her more than 180 per month. She's on too may in my opinion as it stands

## 2014-02-10 ENCOUNTER — Telehealth: Payer: Self-pay | Admitting: *Deleted

## 2014-02-10 MED ORDER — ALPRAZOLAM 0.25 MG PO TABS: 0.2500 mg | ORAL_TABLET | Freq: Three times a day (TID) | ORAL | Status: DC | PRN

## 2014-02-10 NOTE — Telephone Encounter (Signed)
Called patient and explained that [facility] will not increase pain meds and if she wants we can call her in some xanax... Again she declined that option.

## 2014-02-11 NOTE — Telephone Encounter (Signed)
Pt called back agreeing to try xanax for the additional stress, Order was placed per Dr. Charm Barges recommendation at [facility] as requested by pt. Pt.was called and a message was left in pt voicemail that rx had been sent

## 2014-02-12 ENCOUNTER — Telehealth: Payer: Self-pay | Admitting: *Deleted

## 2014-02-12 NOTE — Telephone Encounter (Signed)
Patient called to let us know how much better she is feeling today.  Said she should have listened to Dr. Naaman Plummer in the first place.  Just wanted to let us know and say thank you

## 2014-03-02 ENCOUNTER — Encounter
Payer: Managed Care, Other (non HMO) | Attending: Physical Medicine & Rehabilitation | Admitting: Physical Medicine & Rehabilitation

## 2014-03-02 ENCOUNTER — Encounter: Payer: Self-pay | Admitting: Physical Medicine & Rehabilitation

## 2014-03-02 ENCOUNTER — Other Ambulatory Visit: Payer: Self-pay | Admitting: Physical Medicine & Rehabilitation

## 2014-03-02 VITALS — BP 118/72 | HR 73 | Resp 14 | Ht 64.0 in | Wt 122.0 lb

## 2014-03-02 DIAGNOSIS — M609 Myositis, unspecified: Secondary | ICD-10-CM | POA: Diagnosis present

## 2014-03-02 DIAGNOSIS — K589 Irritable bowel syndrome without diarrhea: Secondary | ICD-10-CM

## 2014-03-02 DIAGNOSIS — G894 Chronic pain syndrome: Secondary | ICD-10-CM | POA: Insufficient documentation

## 2014-03-02 DIAGNOSIS — Z5181 Encounter for therapeutic drug level monitoring: Secondary | ICD-10-CM | POA: Diagnosis present

## 2014-03-02 DIAGNOSIS — M791 Myalgia: Secondary | ICD-10-CM | POA: Diagnosis present

## 2014-03-02 DIAGNOSIS — G43009 Migraine without aura, not intractable, without status migrainosus: Secondary | ICD-10-CM

## 2014-03-02 DIAGNOSIS — F329 Major depressive disorder, single episode, unspecified: Secondary | ICD-10-CM

## 2014-03-02 DIAGNOSIS — G43001 Migraine without aura, not intractable, with status migrainosus: Secondary | ICD-10-CM | POA: Diagnosis present

## 2014-03-02 DIAGNOSIS — IMO0001 Reserved for inherently not codable concepts without codable children: Secondary | ICD-10-CM

## 2014-03-02 DIAGNOSIS — F32A Depression, unspecified: Secondary | ICD-10-CM

## 2014-03-02 DIAGNOSIS — Z79899 Other long term (current) drug therapy: Secondary | ICD-10-CM | POA: Diagnosis present

## 2014-03-02 MED ORDER — HYDROCODONE-ACETAMINOPHEN 10-325 MG PO TABS: 2.0000 | ORAL_TABLET | Freq: Three times a day (TID) | ORAL | Status: DC

## 2014-03-02 NOTE — Patient Instructions (Signed)
MAINTAIN YOUR EXERCISES AND RANGE OF MOTION EACH DAY

## 2014-03-02 NOTE — Progress Notes (Signed)
Subjective:    Patient ID: Jenna Molina, female    DOB: Feb 27, 1956, 58 y.o.   MRN: [id number]  HPI   Mrs. Braver is back regarding her chonic pain. She has been stressed by her husband's illness. I prescribed her some xanax which seemed to help her breakthrough pain and anxiety. Her husband has been doing a little better but is still recovering from a colectomy. Her husband is becoming more self-sufficient which is helping her.   She was able to start back at the gym yesterday and walked a mile, and she stated it felt good.      Pain Inventory Average Pain 7 Pain Right Now 5 My pain is constant, burning, stabbing and aching  In the last 24 hours, has pain interfered with the following? General activity 4 Relation with others 5 Enjoyment of life 5 What TIME of day is your pain at its worst? evening Sleep (in general) Fair  Pain is worse with: bending, sitting, standing and some activites Pain improves with: rest, heat/ice, pacing activities and medication Relief from Meds: 8  Mobility walk without assistance ability to climb steps?  yes do you drive?  yes  Function disabled: date disabled .  Neuro/Psych bowel control problems depression anxiety  Prior Studies Any changes since last visit?  no  Physicians involved in your care Any changes since last visit?  no   Family History  Problem Relation Age of Onset  . Cancer Mother   . Diabetes Mother   . Anxiety disorder Mother   . Emphysema Father   . Anxiety disorder Father   . Alcohol abuse Father   . COPD Father    History   Social History  . Marital Status: Married    Spouse Name: N/A    Number of Children: N/A  . Years of Education: N/A   Social History Main Topics  . Smoking status: Former Smoker    Quit date: 06/19/2008  . Smokeless tobacco: None  . Alcohol Use: No  . Drug Use: No  . Sexual Activity: None   Other Topics Concern  . None   Social History Narrative   Past Surgical History    Procedure Laterality Date  . Inner ear surgery    . Abdominal hysterectomy    . Appendectomy    . Mastoidectomy    . Mole removal     Past Medical History  Diagnosis Date  . Migraines   . Fibromyalgia   . Cancer     Cervical cancer   BP 118/72 mmHg  Pulse 73  Resp 14  Ht 5\' 4"  (1.626 m)  Wt 122 lb (55.339 kg)  BMI 20.93 kg/m2  SpO2 97%  Opioid Risk Score:   Fall Risk Score: Low Fall Risk (0-5 points)   Review of Systems  Constitutional: Negative.   HENT: Negative.   Eyes: Negative.   Respiratory: Negative.   Cardiovascular: Negative.   Gastrointestinal:       Bowel control problems  Endocrine: Negative.   Genitourinary: Negative.   Musculoskeletal: Positive for myalgias, back pain and neck pain.  Skin: Negative.   Allergic/Immunologic: Negative.   Neurological: Negative.   Hematological: Negative.   Psychiatric/Behavioral: Positive for dysphoric mood. The patient is nervous/anxious.        Objective:   Physical Exam  General: Alert and oriented x 3, No apparent distress  HEENT: Head is normocephalic, atraumatic, PERRLA, EOMI, sclera anicteric, oral mucosa pink and moist, dentition intact, ext ear canals clear,  Neck: Supple without JVD or lymphadenopathy  Heart: Reg rate and rhythm. No murmurs rubs or gallops  Chest: CTA bilaterally without wheezes, rales, or rhonchi; no distress  Abdomen: Slightly distended. Mild epigastric pain with palpation  Extremities: No clubbing, cyanosis, or edema. Pulses are 2+  Skin: Clean and intact without signs of breakdown  Neuro: Pt is cognitively appropriate with normal insight, memory, and awareness. Cranial nerves 2-12 are intact. Sensory exam is normal. Reflexes are 2+ in all 4's. Fine motor coordination is intact. No tremors. Motor function is grossly 5/5. SLR is negative,   Musculoskeletal: good lumbar flexion. Cervical ROM functional. Hamstrings are more flexible. Numerous tender points along knees, greater trochs,  PSIS's, low back, elbows, sternum, mid traps, shoulders, sub-occipital all appear less severe today. Gait is stable, normal.  Psych: Pt's affect is appropriate. Pt is cooperative. Very pleasant. Spirits better    Assessment & Plan:   1. FMS with associated symptoms including: chronic fatigue, depression, migraine headaches, RLS, TMJ, numerous tender points and trigger points.  2. Hx of major depression  3. Hypothyroid    Plan:  1. Refilled hydrocodone 10/325 one to 2 q8 prn #180. I gave her a second rx for December.  2. Psychology follow up as directed.  3. Maintain exercise program as she's doing. She understands the importance of stress/anxiety mgt. 4. Continue bowel program as she's doing. 5. Follow up with me in two months. 15 minutes of face to face patient care time were spent during this visit. All questions were encouraged and answered.

## 2014-03-03 LAB — PMP ALCOHOL METABOLITE (ETG): ETGU: NEGATIVE ng/mL

## 2014-03-06 LAB — BENZODIAZEPINES (GC/LC/MS), URINE
Alprazolam metabolite (GC/LC/MS), ur confirm: NEGATIVE ng/mL — AB (ref <25)
Clonazepam metabolite (GC/LC/MS), ur confirm: NEGATIVE ng/mL (ref <25)
Flurazepam metabolite (GC/LC/MS), ur confirm: NEGATIVE ng/mL (ref <50)
LORAZEPAMU: 1428 ng/mL (ref <50)
Midazolam (GC/LC/MS), ur confirm: NEGATIVE ng/mL (ref <50)
NORDIAZEPAMU: NEGATIVE ng/mL (ref <50)
OXAZEPAMU: NEGATIVE ng/mL (ref <50)
TEMAZEPAMU: NEGATIVE ng/mL (ref <50)
Triazolam metabolite (GC/LC/MS), ur confirm: NEGATIVE ng/mL (ref <50)

## 2014-03-06 LAB — PRESCRIPTION MONITORING PROFILE (SOLSTAS)
Amphetamine/Meth: NEGATIVE ng/mL
BUPRENORPHINE, URINE: NEGATIVE ng/mL
Barbiturate Screen, Urine: NEGATIVE ng/mL
CANNABINOID SCRN UR: NEGATIVE ng/mL
Carisoprodol, Urine: NEGATIVE ng/mL
Cocaine Metabolites: NEGATIVE ng/mL
Creatinine, Urine: 40.97 mg/dL (ref >20.0)
ECSTASY: NEGATIVE ng/mL
FENTANYL URINE: NEGATIVE ng/mL
MEPERIDINE UR: NEGATIVE ng/mL
METHADONE SCREEN, URINE: NEGATIVE ng/mL
Nitrites, Initial: NEGATIVE ug/mL
Oxycodone Screen, Ur: NEGATIVE ng/mL
Propoxyphene: NEGATIVE ng/mL
Tapentadol, urine: NEGATIVE ng/mL
Tramadol Scrn, Ur: NEGATIVE ng/mL
Zolpidem, Urine: NEGATIVE ng/mL
pH, Initial: 5.7 pH (ref 4.5–8.9)

## 2014-03-06 LAB — OPIATES/OPIOIDS (LC/MS-MS)
Codeine Urine: NEGATIVE ng/mL (ref <50)
HYDROCODONE: 1735 ng/mL (ref <50)
Hydromorphone: 129 ng/mL (ref <50)
Morphine Urine: NEGATIVE ng/mL (ref <50)
NOROXYCODONE, UR: NEGATIVE ng/mL (ref <50)
Norhydrocodone, Ur: 3826 ng/mL (ref <50)
Oxycodone, ur: NEGATIVE ng/mL (ref <50)
Oxymorphone: NEGATIVE ng/mL (ref <50)

## 2014-03-12 ENCOUNTER — Encounter: Payer: Self-pay | Admitting: Physical Medicine & Rehabilitation

## 2014-03-16 ENCOUNTER — Telehealth: Payer: Self-pay | Admitting: Physical Medicine & Rehabilitation

## 2014-03-16 NOTE — Telephone Encounter (Signed)
Patient would like Dr. Naaman Plummer to call her.  She had some lab work done at her primary MD office and they have not went over the results of it with her and she would like to discuss this with Dr. Naaman Plummer.

## 2014-03-16 NOTE — Telephone Encounter (Signed)
I spoke with Jenna Molina and she said not to bother him.  It was some kidney tests and she is upset that the PCP office did not discuss this with her.  I explained that we do not even have access to the labs for [name] to look at and would be inappropriate to discuss without mor information. I encouraged her to call her PCP and ask them about the labs. (she sent a mychart message) but I suggested she call instead if she has questions. It is best to discuss this with the physician that ordered the tests.

## 2014-03-25 NOTE — Progress Notes (Addendum)
Urine drug screen for this encounter is consistent for prescribed medication except she reports taking her xanax on day of test but no drug detected.

## 2014-03-26 ENCOUNTER — Other Ambulatory Visit: Payer: Self-pay | Admitting: Family Medicine

## 2014-03-26 ENCOUNTER — Ambulatory Visit
Admission: RE | Admit: 2014-03-26 | Discharge: 2014-03-26 | Disposition: A | Discharge status: Home / Self Care | Payer: Managed Care, Other (non HMO) | Source: Ambulatory Visit | Attending: Family Medicine | Admitting: Family Medicine

## 2014-03-26 DIAGNOSIS — R1084 Generalized abdominal pain: Secondary | ICD-10-CM

## 2014-03-29 ENCOUNTER — Emergency Department (HOSPITAL_COMMUNITY): Payer: Managed Care, Other (non HMO)

## 2014-03-29 ENCOUNTER — Encounter (HOSPITAL_COMMUNITY): Payer: Self-pay | Admitting: Emergency Medicine

## 2014-03-29 ENCOUNTER — Inpatient Hospital Stay (HOSPITAL_COMMUNITY)
Admission: EM | Admit: 2014-03-29 | Discharge: 2014-04-03 | DRG: 419 | Disposition: A | Discharge status: Home / Self Care | Payer: Managed Care, Other (non HMO) | Attending: Internal Medicine | Admitting: Internal Medicine

## 2014-03-29 ENCOUNTER — Other Ambulatory Visit: Payer: Self-pay

## 2014-03-29 DIAGNOSIS — K8065 Calculus of gallbladder and bile duct with chronic cholecystitis with obstruction: Principal | ICD-10-CM | POA: Diagnosis present

## 2014-03-29 DIAGNOSIS — I341 Nonrheumatic mitral (valve) prolapse: Secondary | ICD-10-CM | POA: Diagnosis present

## 2014-03-29 DIAGNOSIS — R1011 Right upper quadrant pain: Secondary | ICD-10-CM | POA: Diagnosis present

## 2014-03-29 DIAGNOSIS — Z8541 Personal history of malignant neoplasm of cervix uteri: Secondary | ICD-10-CM

## 2014-03-29 DIAGNOSIS — Z419 Encounter for procedure for purposes other than remedying health state, unspecified: Secondary | ICD-10-CM

## 2014-03-29 DIAGNOSIS — Z888 Allergy status to other drugs, medicaments and biological substances status: Secondary | ICD-10-CM | POA: Diagnosis not present

## 2014-03-29 DIAGNOSIS — E039 Hypothyroidism, unspecified: Secondary | ICD-10-CM | POA: Diagnosis present

## 2014-03-29 DIAGNOSIS — R109 Unspecified abdominal pain: Secondary | ICD-10-CM | POA: Diagnosis present

## 2014-03-29 DIAGNOSIS — Z886 Allergy status to analgesic agent status: Secondary | ICD-10-CM | POA: Diagnosis not present

## 2014-03-29 DIAGNOSIS — G894 Chronic pain syndrome: Secondary | ICD-10-CM | POA: Diagnosis present

## 2014-03-29 DIAGNOSIS — K589 Irritable bowel syndrome without diarrhea: Secondary | ICD-10-CM | POA: Diagnosis present

## 2014-03-29 DIAGNOSIS — Z90710 Acquired absence of both cervix and uterus: Secondary | ICD-10-CM | POA: Diagnosis not present

## 2014-03-29 DIAGNOSIS — Z818 Family history of other mental and behavioral disorders: Secondary | ICD-10-CM

## 2014-03-29 DIAGNOSIS — Z79891 Long term (current) use of opiate analgesic: Secondary | ICD-10-CM | POA: Diagnosis not present

## 2014-03-29 DIAGNOSIS — M797 Fibromyalgia: Secondary | ICD-10-CM | POA: Diagnosis present

## 2014-03-29 DIAGNOSIS — Z79899 Other long term (current) drug therapy: Secondary | ICD-10-CM

## 2014-03-29 DIAGNOSIS — Z9109 Other allergy status, other than to drugs and biological substances: Secondary | ICD-10-CM

## 2014-03-29 DIAGNOSIS — K5710 Diverticulosis of small intestine without perforation or abscess without bleeding: Secondary | ICD-10-CM | POA: Diagnosis present

## 2014-03-29 DIAGNOSIS — Z87891 Personal history of nicotine dependence: Secondary | ICD-10-CM

## 2014-03-29 DIAGNOSIS — Z86010 Personal history of colonic polyps: Secondary | ICD-10-CM | POA: Diagnosis not present

## 2014-03-29 DIAGNOSIS — M609 Myositis, unspecified: Secondary | ICD-10-CM | POA: Diagnosis present

## 2014-03-29 DIAGNOSIS — G43009 Migraine without aura, not intractable, without status migrainosus: Secondary | ICD-10-CM | POA: Diagnosis present

## 2014-03-29 DIAGNOSIS — R1013 Epigastric pain: Secondary | ICD-10-CM

## 2014-03-29 DIAGNOSIS — F418 Other specified anxiety disorders: Secondary | ICD-10-CM | POA: Diagnosis present

## 2014-03-29 DIAGNOSIS — Z881 Allergy status to other antibiotic agents status: Secondary | ICD-10-CM

## 2014-03-29 DIAGNOSIS — Z809 Family history of malignant neoplasm, unspecified: Secondary | ICD-10-CM | POA: Diagnosis not present

## 2014-03-29 DIAGNOSIS — K8011 Calculus of gallbladder with chronic cholecystitis with obstruction: Secondary | ICD-10-CM | POA: Diagnosis present

## 2014-03-29 DIAGNOSIS — G43909 Migraine, unspecified, not intractable, without status migrainosus: Secondary | ICD-10-CM | POA: Diagnosis present

## 2014-03-29 DIAGNOSIS — K760 Fatty (change of) liver, not elsewhere classified: Secondary | ICD-10-CM | POA: Diagnosis present

## 2014-03-29 DIAGNOSIS — Z825 Family history of asthma and other chronic lower respiratory diseases: Secondary | ICD-10-CM

## 2014-03-29 DIAGNOSIS — Z833 Family history of diabetes mellitus: Secondary | ICD-10-CM | POA: Diagnosis not present

## 2014-03-29 DIAGNOSIS — Z8711 Personal history of peptic ulcer disease: Secondary | ICD-10-CM | POA: Diagnosis not present

## 2014-03-29 DIAGNOSIS — K838 Other specified diseases of biliary tract: Secondary | ICD-10-CM | POA: Diagnosis present

## 2014-03-29 DIAGNOSIS — K8050 Calculus of bile duct without cholangitis or cholecystitis without obstruction: Secondary | ICD-10-CM | POA: Diagnosis present

## 2014-03-29 LAB — COMPREHENSIVE METABOLIC PANEL WITH GFR
ALT: 116 U/L — ABNORMAL HIGH (ref 0–35)
AST: 40 U/L — ABNORMAL HIGH (ref 0–37)
Albumin: 3.7 g/dL (ref 3.5–5.2)
Alkaline Phosphatase: 217 U/L — ABNORMAL HIGH (ref 39–117)
Anion gap: 13 (ref 5–15)
BUN: 13 mg/dL (ref 6–23)
CO2: 23 meq/L (ref 19–32)
Calcium: 9.5 mg/dL (ref 8.4–10.5)
Chloride: 98 meq/L (ref 96–112)
Creatinine, Ser: 0.88 mg/dL (ref 0.50–1.10)
GFR calc Af Amer: 81 mL/min — ABNORMAL LOW
GFR calc non Af Amer: 70 mL/min — ABNORMAL LOW
Glucose, Bld: 118 mg/dL — ABNORMAL HIGH (ref 70–99)
Potassium: 4.2 meq/L (ref 3.7–5.3)
Sodium: 134 meq/L — ABNORMAL LOW (ref 137–147)
Total Bilirubin: 0.9 mg/dL (ref 0.3–1.2)
Total Protein: 7.3 g/dL (ref 6.0–8.3)

## 2014-03-29 LAB — CBC WITH DIFFERENTIAL/PLATELET
BASOS PCT: 1 % (ref 0–1)
Basophils Absolute: 0 10*3/uL (ref 0.0–0.1)
Eosinophils Absolute: 0.1 10*3/uL (ref 0.0–0.7)
Eosinophils Relative: 2 % (ref 0–5)
HCT: 35.7 % — ABNORMAL LOW (ref 36.0–46.0)
Hemoglobin: 11.7 g/dL — ABNORMAL LOW (ref 12.0–15.0)
LYMPHS ABS: 1.9 10*3/uL (ref 0.7–4.0)
Lymphocytes Relative: 28 % (ref 12–46)
MCH: 28.1 pg (ref 26.0–34.0)
MCHC: 32.8 g/dL (ref 30.0–36.0)
MCV: 85.6 fL (ref 78.0–100.0)
Monocytes Absolute: 0.8 10*3/uL (ref 0.1–1.0)
Monocytes Relative: 12 % (ref 3–12)
NEUTROS PCT: 57 % (ref 43–77)
Neutro Abs: 4 10*3/uL (ref 1.7–7.7)
PLATELETS: 298 10*3/uL (ref 150–400)
RBC: 4.17 MIL/uL (ref 3.87–5.11)
RDW: 14.3 % (ref 11.5–15.5)
WBC: 6.8 10*3/uL (ref 4.0–10.5)

## 2014-03-29 LAB — TROPONIN I
Troponin I: <0.3 ng/mL (ref <0.30)
Troponin I: <0.3 ng/mL (ref <0.30)

## 2014-03-29 LAB — URINALYSIS, ROUTINE W REFLEX MICROSCOPIC
BILIRUBIN URINE: NEGATIVE
Glucose, UA: NEGATIVE mg/dL
Hgb urine dipstick: NEGATIVE
KETONES UR: NEGATIVE mg/dL
LEUKOCYTES UA: NEGATIVE
NITRITE: NEGATIVE
PROTEIN: NEGATIVE mg/dL
Specific Gravity, Urine: 1.005 (ref 1.005–1.030)
Urobilinogen, UA: 0.2 mg/dL (ref 0.0–1.0)
pH: 6 (ref 5.0–8.0)

## 2014-03-29 LAB — HEPATITIS PANEL, ACUTE
HCV Ab: NEGATIVE
HEP B S AG: NEGATIVE
Hep A IgM: NONREACTIVE
Hep B C IgM: NONREACTIVE

## 2014-03-29 LAB — HEMOGLOBIN A1C
HEMOGLOBIN A1C: 5.9 % — AB (ref <5.7)
MEAN PLASMA GLUCOSE: 123 mg/dL — AB (ref <117)

## 2014-03-29 LAB — ACETAMINOPHEN LEVEL: Acetaminophen (Tylenol), Serum: <15 ug/mL (ref 10–30)

## 2014-03-29 LAB — TSH: TSH: 3.64 u[IU]/mL (ref 0.350–4.500)

## 2014-03-29 LAB — LIPASE, BLOOD: LIPASE: 100 U/L — AB (ref 11–59)

## 2014-03-29 MED ORDER — SODIUM CHLORIDE 0.9 % IJ SOLN
3.0000 mL | Freq: Two times a day (BID) | INTRAMUSCULAR | Status: DC
  Administered 2014-03-29 – 2014-04-01 (×5): 3 mL via INTRAVENOUS

## 2014-03-29 MED ORDER — PANTOPRAZOLE SODIUM 40 MG PO TBEC
40.0000 mg | DELAYED_RELEASE_TABLET | Freq: Every day | ORAL | Status: DC
  Administered 2014-03-30 – 2014-04-03 (×4): 40 mg via ORAL
  Filled 2014-03-29 (×5): qty 1

## 2014-03-29 MED ORDER — SODIUM CHLORIDE 0.9 % IV BOLUS (SEPSIS)
1000.0000 mL | Freq: Once | INTRAVENOUS | Status: AC
  Administered 2014-03-29: 1000 mL via INTRAVENOUS

## 2014-03-29 MED ORDER — CYCLOBENZAPRINE HCL 10 MG PO TABS
20.0000 mg | ORAL_TABLET | Freq: Every day | ORAL | Status: DC
  Administered 2014-03-29 – 2014-04-02 (×5): 20 mg via ORAL
  Filled 2014-03-29 (×7): qty 2

## 2014-03-29 MED ORDER — HYDROMORPHONE HCL 1 MG/ML IJ SOLN
0.5000 mg | INTRAMUSCULAR | Status: DC | PRN
  Administered 2014-03-29: 1 mg via INTRAVENOUS
  Filled 2014-03-29 (×2): qty 1

## 2014-03-29 MED ORDER — ALPRAZOLAM 0.25 MG PO TABS: 0.2500 mg | ORAL_TABLET | Freq: Three times a day (TID) | ORAL | Status: DC | PRN

## 2014-03-29 MED ORDER — HYDROMORPHONE HCL 1 MG/ML IJ SOLN
0.5000 mg | Freq: Once | INTRAMUSCULAR | Status: AC
  Administered 2014-03-29: 0.5 mg via INTRAVENOUS
  Filled 2014-03-29: qty 1

## 2014-03-29 MED ORDER — ONDANSETRON HCL 4 MG/2ML IJ SOLN
4.0000 mg | Freq: Once | INTRAMUSCULAR | Status: AC
  Administered 2014-03-29: 4 mg via INTRAVENOUS
  Filled 2014-03-29: qty 2

## 2014-03-29 MED ORDER — TOPIRAMATE 100 MG PO TABS
200.0000 mg | ORAL_TABLET | Freq: Every morning | ORAL | Status: DC
  Administered 2014-03-30 – 2014-04-03 (×4): 200 mg via ORAL
  Filled 2014-03-29 (×5): qty 2

## 2014-03-29 MED ORDER — LORAZEPAM 1 MG PO TABS
1.0000 mg | ORAL_TABLET | Freq: Four times a day (QID) | ORAL | Status: DC | PRN
  Administered 2014-03-29 – 2014-04-03 (×11): 1 mg via ORAL
  Filled 2014-03-29 (×11): qty 1

## 2014-03-29 MED ORDER — DOCUSATE SODIUM 50 MG PO CAPS
50.0000 mg | ORAL_CAPSULE | Freq: Every day | ORAL | Status: DC
  Administered 2014-03-29 – 2014-04-02 (×5): 50 mg via ORAL
  Filled 2014-03-29 (×6): qty 1

## 2014-03-29 MED ORDER — OXYCODONE HCL 5 MG PO TABS
10.0000 mg | ORAL_TABLET | ORAL | Status: DC | PRN
  Administered 2014-03-29 – 2014-04-01 (×7): 10 mg via ORAL
  Filled 2014-03-29 (×7): qty 2

## 2014-03-29 MED ORDER — OXYCODONE HCL 5 MG PO TABS: 5.0000 mg | ORAL_TABLET | ORAL | Status: DC | PRN

## 2014-03-29 MED ORDER — LEVOTHYROXINE SODIUM 100 MCG PO TABS
100.0000 ug | ORAL_TABLET | Freq: Every day | ORAL | Status: DC
  Administered 2014-03-30 – 2014-04-03 (×5): 100 ug via ORAL
  Filled 2014-03-29 (×6): qty 1

## 2014-03-29 MED ORDER — ENOXAPARIN SODIUM 40 MG/0.4ML ~~LOC~~ SOLN: 40.0000 mg | SUBCUTANEOUS | Status: DC

## 2014-03-29 MED ORDER — CITALOPRAM HYDROBROMIDE 40 MG PO TABS
40.0000 mg | ORAL_TABLET | Freq: Every day | ORAL | Status: DC
  Administered 2014-03-29 – 2014-04-02 (×5): 40 mg via ORAL
  Filled 2014-03-29 (×7): qty 1

## 2014-03-29 MED ORDER — LEVALBUTEROL HCL 0.63 MG/3ML IN NEBU: 0.6300 mg | INHALATION_SOLUTION | Freq: Four times a day (QID) | RESPIRATORY_TRACT | Status: DC | PRN

## 2014-03-29 MED ORDER — ONDANSETRON HCL 4 MG PO TABS: 4.0000 mg | ORAL_TABLET | Freq: Four times a day (QID) | ORAL | Status: DC | PRN

## 2014-03-29 MED ORDER — ONDANSETRON HCL 4 MG/2ML IJ SOLN
4.0000 mg | Freq: Four times a day (QID) | INTRAMUSCULAR | Status: DC | PRN
  Administered 2014-03-31: 4 mg via INTRAVENOUS
  Filled 2014-03-29: qty 2

## 2014-03-29 MED ORDER — SODIUM CHLORIDE 0.9 % IV SOLN
INTRAVENOUS | Status: DC
  Administered 2014-03-29 – 2014-04-03 (×7): via INTRAVENOUS

## 2014-03-29 NOTE — ED Notes (Addendum)
Pt reports being seen by PCP who suspect galbladder issue. Pt reports was to see GI today but told to come to ED if pain started prior to appointment. Pt reports severe onset of mid abdominal pain radiating to left flank since 0730. Pt continues to report took hydrocodone for pain at 0830 which has helped the pain some.

## 2014-03-29 NOTE — ED Provider Notes (Signed)
CSN: [id number]     Arrival date & time 03/29/14  8413 History   First MD Initiated Contact with Patient 03/29/14 1008     Chief Complaint  Patient presents with  . Abdominal Pain     (Consider location/radiation/quality/duration/timing/severity/associated sxs/prior Treatment) Patient is a 58 y.o. female presenting with abdominal pain.  Abdominal Pain Pain location:  RUQ Pain quality: sharp   Pain radiates to:  Does not radiate Pain severity:  Moderate Onset quality:  Gradual Duration:  1 week Timing:  Constant Progression:  Worsening Chronicity:  New Context: eating   Context comment:  Elevated alkphos, lipase Relieved by:  Nothing Worsened by:  Palpation and eating Associated symptoms: fever (tmax 102 5 days ago), nausea and vomiting   Associated symptoms: no anorexia, no cough and no diarrhea     Past Medical History  Diagnosis Date  . Migraines   . Fibromyalgia   . Cancer     Cervical cancer   Past Surgical History  Procedure Laterality Date  . Inner ear surgery    . Abdominal hysterectomy    . Appendectomy    . Mastoidectomy    . Mole removal     Family History  Problem Relation Age of Onset  . Cancer Mother   . Diabetes Mother   . Anxiety disorder Mother   . Emphysema Father   . Anxiety disorder Father   . Alcohol abuse Father   . COPD Father    History  Substance Use Topics  . Smoking status: Former Smoker    Quit date: 06/19/2008  . Smokeless tobacco: Not on file  . Alcohol Use: No   OB History    No data available     Review of Systems  Constitutional: Positive for fever (tmax 102 5 days ago).  Respiratory: Negative for cough.   Gastrointestinal: Positive for nausea, vomiting and abdominal pain. Negative for diarrhea and anorexia.  All other systems reviewed and are negative.     Allergies  Aspirin; Lyrica; Sulfa antibiotics; Adhesive; and Neosporin  Home Medications   Prior to Admission medications   Medication Sig Start Date  End Date Taking? Authorizing Provider  Acetaminophen 500 MG coapsule Take 250-500 mg by mouth 3 (three) times daily. Takes 250mg  at [facility] and 0800 and 500mg  at bedtime (1430)   Yes Historical Provider, MD  cholecalciferol (VITAMIN D) 1000 UNITS tablet Take 5,000 Units by mouth every morning.   Yes Historical Provider, MD  citalopram (CELEXA) 40 MG tablet Take 50 mg by mouth at bedtime.    Yes Historical Provider, MD  cyclobenzaprine (FLEXERIL) 10 MG tablet Take 2 tablets (20 mg total) by mouth at bedtime. 12/29/13  Yes Meredith Staggers, MD  diphenhydrAMINE (BENADRYL) 25 mg capsule Take 25 mg by mouth every 12 (twelve) hours as needed for itching.  05/10/12  Yes John-Adam Bonk, MD  Docusate Calcium (STOOL SOFTENER PO) Take 50 mg by mouth at bedtime as needed.   Yes Historical Provider, MD  esomeprazole (NEXIUM) 40 MG capsule Take 80 mg by mouth daily before breakfast.    Yes Historical Provider, MD  HYDROcodone-acetaminophen (NORCO) 10-325 MG per tablet Take 2 tablets by mouth every 8 (eight) hours. 03/02/14  Yes Meredith Staggers, MD  levothyroxine (SYNTHROID, LEVOTHROID) 100 MCG tablet Take 100 mcg by mouth every morning.   Yes Historical Provider, MD  LORazepam (ATIVAN) 1 MG tablet Take 1 mg by mouth every 6 (six) hours as needed. For anxiety   Yes Historical Provider,  MD  Polyethyl Glycol-Propyl Glycol (SYSTANE OP) Place 2 drops into both eyes daily as needed (dryness).   Yes Historical Provider, MD  promethazine (PHENERGAN) 25 MG tablet Take 25 mg by mouth every 6 (six) hours as needed. For nausea   Yes Historical Provider, MD  SUMAtriptan (IMITREX) 100 MG tablet Take 100 mg by mouth every 2 (two) hours as needed. For migraine   Yes Historical Provider, MD  topiramate (TOPAMAX) 200 MG tablet Take 200 mg by mouth every morning.   Yes Historical Provider, MD  vitamin E 400 UNIT capsule Take 400 Units by mouth every morning.   Yes Historical Provider, MD  ALPRAZolam (XANAX) 0.25 MG tablet Take 1 tablet  (0.25 mg total) by mouth every 8 (eight) hours as needed for anxiety. 02/10/14   Meredith Staggers, MD   BP 109/59 mmHg  Pulse 66  Temp(Src) 97.7 F (36.5 C) (Oral)  Resp 16  SpO2 97% Physical Exam  Constitutional: She is oriented to person, place, and time. She appears well-developed and well-nourished.  HENT:  Head: Normocephalic and atraumatic.  Right Ear: External ear normal.  Left Ear: External ear normal.  Eyes: Conjunctivae and EOM are normal. Pupils are equal, round, and reactive to light.  Neck: Normal range of motion. Neck supple.  Cardiovascular: Normal rate, regular rhythm, normal heart sounds and intact distal pulses.   Pulmonary/Chest: Effort normal and breath sounds normal.  Abdominal: Soft. Bowel sounds are normal. There is tenderness in the right upper quadrant and epigastric area.  Musculoskeletal: Normal range of motion.  Neurological: She is alert and oriented to person, place, and time.  Skin: Skin is warm and dry.  Vitals reviewed.   ED Course  Procedures (including critical care time) Labs Review Labs Reviewed  COMPREHENSIVE METABOLIC PANEL - Abnormal; Notable for the following:    Sodium 134 (*)    Glucose, Bld 118 (*)    AST 40 (*)    ALT 116 (*)    Alkaline Phosphatase 217 (*)    GFR calc non Af Amer 70 (*)    GFR calc Af Amer 81 (*)    All other components within normal limits  CBC WITH DIFFERENTIAL - Abnormal; Notable for the following:    Hemoglobin 11.7 (*)    HCT 35.7 (*)    All other components within normal limits  LIPASE, BLOOD - Abnormal; Notable for the following:    Lipase 100 (*)    All other components within normal limits  URINE CULTURE  URINALYSIS, ROUTINE W REFLEX MICROSCOPIC  TROPONIN I  TROPONIN I  TROPONIN I  TSH  HEMOGLOBIN A1C  ACETAMINOPHEN LEVEL  HEPATITIS PANEL, ACUTE    Imaging Review US Abdomen Complete  03/29/2014   CLINICAL DATA:  Pain.  EXAM: ULTRASOUND ABDOMEN COMPLETE  COMPARISON:  03/26/2014.   FINDINGS: Gallbladder: Previously identified 5 mm nodular density is now mobile and most likely represents focal area of mobile sludge and/or gallstone. Gallbladder wall thickening has resolved. Gallbladder wall thickness is now 2 mm which is normal. Negative Murphy sign. No pericholecystic fluid collection.  Common bile duct: Diameter: 14 mm.  Liver: Liver echodense consistent with fatty infiltration. Focal fatty sparing again noted adjacent to the gallbladder.  IVC: No abnormality visualized.  Pancreas: Visualized portion unremarkable.  Spleen: Size and appearance within normal limits.  Right Kidney: Length: 10.0 cm. Echogenicity within normal limits. No mass or hydronephrosis visualized.  Left Kidney: Length: 10.2 cm. Echogenicity within normal limits. No mass or hydronephrosis  visualized.  Abdominal aorta: No aneurysm visualized.  Other findings: None.  IMPRESSION: 1. Progressive distention of the common bile duct. Common bile duct now measures up to 14 mm. MRCP should be considered for further evaluation. 2. Previously identified 5 mm non mobile density in the gallbladder is now mobile and most likely represent focal area of tumefactive sludge and/or gallstones. Gallbladder wall thickening has resolved. Negative Murphy sign. No pericholecystic fluid collection. 3. Fatty infiltration of the liver with focal fatty sparing adjacent to the gallbladder.   Electronically Signed   By: Marcello Moores  Register   On: 03/29/2014 11:40     EKG Interpretation None      MDM   Final diagnoses:  RUQ abdominal pain    58 y.o. female with pertinent PMH of cervical ca sp TAH presents with abd pain as above.  Seen by GI with MRCP performed, concerning for GB wall thickening and possible cholelithiasis.  Symptoms persisted, so pt presents today.  She has had a fever in the last week, however today is afebrile.  Physical exam as above with right upper quadrant and epigastric tenderness. Labs drawn and as above worsening from  previous.  Ultrasound obtained and was cholelithiasis without evidence of cholecystitis. There is also common bile duct enlargement. I spoke with the gastroenterologist on call for [facility] who recommended admission for possible MRCP. Consulted hospitalist for admission.  1. RUQ abdominal pain         Debby Freiberg, MD 03/29/14 1257

## 2014-03-29 NOTE — H&P (Addendum)
[facility] History and Physical  Jenna Molina SWF:[id number] DOB: December 20, 1955 DOA: 03/29/2014  Referring physician: * PCP: Tawanna Solo, MD   Chief Complaint: Abdominal Pain  HPI:  58 year old female with a history of  cervical cancer status post total abdominal hysterectomy, recent EGD/colonoscopy at [facility] in [place], ECG showing gastritis, colonoscopy to showing some random polyps, who presents to the ER with abdominal pain. Patient takes Vicodin on regular basis and states that her ALT level was mildly elevated last week. The patient had an MRCP done on Friday last week ordered by Dr. Rex Kras, patient's primary care provider. Results were called into the PCP office by [facility]. Patient complains of pain in the right upper quadrant and the epigastric region. Ultrasound obtained shows cholelithiasis without evidence of cholecystitis, dilatation of the common bile duct.  Alkaline phosphatase 217 lipase 100 AST 40 ALT 116, normal bilirubin, UA negative Patient refuses to  [facility] : Gastroenterology and requesting to be seen by [facility] gastroenterology    Review of Systems: negative for the following  Constitutional: Denies fever, chills, diaphoresis, lack of appetite HEENT: Denies photophobia, eye pain, redness, hearing loss, ear pain, congestion, sore throat, rhinorrhea, sneezing, mouth sores, trouble swallowing, neck pain, neck stiffness and tinnitus.  Respiratory: Denies SOB, DOE, cough, chest tightness, and wheezing.  Cardiovascular: Denies chest pain, palpitations and leg swelling.  Constitutional: Positive for fever (tmax 102 5 days ago).  Respiratory: Negative for cough.  Gastrointestinal: Positive for nausea, vomiting and abdominal pain Genitourinary: Denies dysuria, urgency, frequency, hematuria, flank pain and difficulty urinating.  Musculoskeletal: Denies myalgias, back pain, joint swelling, arthralgias and gait problem.  Skin: Denies  pallor, rash and wound.  Neurological: Denies dizziness, seizures, syncope, weakness, light-headedness, numbness and headaches.  Hematological: Denies adenopathy. Easy bruising, personal or family bleeding history  Psychiatric/Behavioral: Denies suicidal ideation, mood changes, confusion, nervousness, sleep disturbance and agitation       Past Medical History  Diagnosis Date  . Migraines   . Fibromyalgia   . Cancer     Cervical cancer     Past Surgical History  Procedure Laterality Date  . Inner ear surgery    . Abdominal hysterectomy    . Appendectomy    . Mastoidectomy    . Mole removal        Social History:  reports that she quit smoking about 5 years ago. She does not have any smokeless tobacco history on file. She reports that she does not drink alcohol or use illicit drugs.    Allergies  Allergen Reactions  . Aspirin Nausea Only  . Lyrica [Pregabalin]     Involuntary movement  . Sulfa Antibiotics Hives  . Adhesive [Tape] Rash  . Neosporin [Neomycin-Bacitracin Zn-Polymyx] Rash    Family History  Problem Relation Age of Onset  . Cancer Mother   . Diabetes Mother   . Anxiety disorder Mother   . Emphysema Father   . Anxiety disorder Father   . Alcohol abuse Father   . COPD Father      Prior to Admission medications   Medication Sig Start Date End Date Taking? Authorizing Provider  Acetaminophen 500 MG coapsule Take 250-500 mg by mouth 3 (three) times daily. Takes 250mg  at [place] and 0800 and 500mg  at bedtime (1430)   Yes Historical Provider, MD  cholecalciferol (VITAMIN D) 1000 UNITS tablet Take 5,000 Units by mouth every morning.   Yes Historical Provider, MD  citalopram (CELEXA) 40 MG tablet Take 50 mg by mouth at  bedtime.    Yes Historical Provider, MD  cyclobenzaprine (FLEXERIL) 10 MG tablet Take 2 tablets (20 mg total) by mouth at bedtime. 12/29/13  Yes Meredith Staggers, MD  diphenhydrAMINE (BENADRYL) 25 mg capsule Take 25 mg by mouth every 12 (twelve)  hours as needed for itching.  05/10/12  Yes John-Adam Bonk, MD  Docusate Calcium (STOOL SOFTENER PO) Take 50 mg by mouth at bedtime as needed.   Yes Historical Provider, MD  esomeprazole (NEXIUM) 40 MG capsule Take 80 mg by mouth daily before breakfast.    Yes Historical Provider, MD  HYDROcodone-acetaminophen (NORCO) 10-325 MG per tablet Take 2 tablets by mouth every 8 (eight) hours. 03/02/14  Yes Meredith Staggers, MD  levothyroxine (SYNTHROID, LEVOTHROID) 100 MCG tablet Take 100 mcg by mouth every morning.   Yes Historical Provider, MD  LORazepam (ATIVAN) 1 MG tablet Take 1 mg by mouth every 6 (six) hours as needed. For anxiety   Yes Historical Provider, MD  Polyethyl Glycol-Propyl Glycol (SYSTANE OP) Place 2 drops into both eyes daily as needed (dryness).   Yes Historical Provider, MD  promethazine (PHENERGAN) 25 MG tablet Take 25 mg by mouth every 6 (six) hours as needed. For nausea   Yes Historical Provider, MD  SUMAtriptan (IMITREX) 100 MG tablet Take 100 mg by mouth every 2 (two) hours as needed. For migraine   Yes Historical Provider, MD  topiramate (TOPAMAX) 200 MG tablet Take 200 mg by mouth every morning.   Yes Historical Provider, MD  vitamin E 400 UNIT capsule Take 400 Units by mouth every morning.   Yes Historical Provider, MD  ALPRAZolam (XANAX) 0.25 MG tablet Take 1 tablet (0.25 mg total) by mouth every 8 (eight) hours as needed for anxiety. 02/10/14   Meredith Staggers, MD     Physical Exam: Filed Vitals:   03/29/14 1009 03/29/14 1211 03/29/14 1247  BP: 101/86 113/61 109/59  Pulse: 82 70 66  Temp: 97.7 F (36.5 C)    TempSrc: Oral    Resp: 16 16 16   SpO2: 100% 100% 97%     Constitutional: Vital signs reviewed. Patient is a well-developed and well-nourished in no acute distress and cooperative with exam. Alert and oriented x3.  Head: Normocephalic and atraumatic  Ear: TM normal bilaterally  Mouth: no erythema or exudates, MMM  Eyes: PERRL, EOMI, conjunctivae normal, No  scleral icterus.  Neck: Supple, Trachea midline normal ROM, No JVD, mass, thyromegaly, or carotid bruit present.  Cardiovascular: RRR, S1 normal, S2 normal, no MRG, pulses symmetric and intact bilaterally  Pulmonary/Chest: CTAB, no wheezes, rales, or rhonchi  Abdominal: Soft.  , non-distended, bowel sounds are normal, no masses, organomegaly, or guarding present.  mild epigastric and right upper quadrant tenderness GU: no CVA tenderness Musculoskeletal: No joint deformities, erythema, or stiffness, ROM full and no nontender Ext: no edema and no cyanosis, pulses palpable bilaterally (DP and PT)  Hematology: no cervical, inginal, or axillary adenopathy.  Neurological: A&O x3, Strenght is normal and symmetric bilaterally, cranial nerve II-XII are grossly intact, no focal motor deficit, sensory intact to light touch bilaterally.  Skin: Warm, dry and intact. No rash, cyanosis, or clubbing.  Psychiatric: Normal mood and affect. speech and behavior is normal. Judgment and thought content normal. Cognition and memory are normal.       Labs on Admission:    Basic Metabolic Panel:  Recent Labs Lab 03/29/14 1021  NA 134*  K 4.2  CL 98  CO2 23  GLUCOSE 118*  BUN 13  CREATININE 0.88  CALCIUM 9.5   Liver Function Tests:  Recent Labs Lab 03/29/14 1021  AST 40*  ALT 116*  ALKPHOS 217*  BILITOT 0.9  PROT 7.3  ALBUMIN 3.7    Recent Labs Lab 03/29/14 1021  LIPASE 100*   No results for input(s): AMMONIA in the last 168 hours. CBC:  Recent Labs Lab 03/29/14 1021  WBC 6.8  NEUTROABS 4.0  HGB 11.7*  HCT 35.7*  MCV 85.6  PLT 298   Cardiac Enzymes: No results for input(s): CKTOTAL, CKMB, CKMBINDEX, TROPONINI in the last 168 hours.  BNP (last 3 results) No results for input(s): PROBNP in the last 8760 hours.    CBG: No results for input(s): GLUCAP in the last 168 hours.  Radiological Exams on Admission: US Abdomen Complete  03/29/2014   CLINICAL DATA:  Pain.   EXAM: ULTRASOUND ABDOMEN COMPLETE  COMPARISON:  03/26/2014.  FINDINGS: Gallbladder: Previously identified 5 mm nodular density is now mobile and most likely represents focal area of mobile sludge and/or gallstone. Gallbladder wall thickening has resolved. Gallbladder wall thickness is now 2 mm which is normal. Negative Murphy sign. No pericholecystic fluid collection.  Common bile duct: Diameter: 14 mm.  Liver: Liver echodense consistent with fatty infiltration. Focal fatty sparing again noted adjacent to the gallbladder.  IVC: No abnormality visualized.  Pancreas: Visualized portion unremarkable.  Spleen: Size and appearance within normal limits.  Right Kidney: Length: 10.0 cm. Echogenicity within normal limits. No mass or hydronephrosis visualized.  Left Kidney: Length: 10.2 cm. Echogenicity within normal limits. No mass or hydronephrosis visualized.  Abdominal aorta: No aneurysm visualized.  Other findings: None.  IMPRESSION: 1. Progressive distention of the common bile duct. Common bile duct now measures up to 14 mm. MRCP should be considered for further evaluation. 2. Previously identified 5 mm non mobile density in the gallbladder is now mobile and most likely represent focal area of tumefactive sludge and/or gallstones. Gallbladder wall thickening has resolved. Negative Murphy sign. No pericholecystic fluid collection. 3. Fatty infiltration of the liver with focal fatty sparing adjacent to the gallbladder.   Electronically Signed   By: Marcello Moores  Register   On: 03/29/2014 11:40    EKG: Independently reviewed.  ent/Plan Active Problems:   Abdominal pain, acute   Abdominal pain   Abdominal pain Recent MRCP last week Results to be obtained from [facility] Liver function trending up [facility] consult gastroenterology Tylenol level Acute hepatitis panel Will defer autoimmune workup to GI  Chronic pain syndrome secondary to fibromyalgia Patient on chronic narcotics followed by Dr. Tessa Lerner Continue  anxiety medicine   Code Status:   full Family Communication: bedside Disposition Plan: admit    Time spent: 70 mins   [facility] Pager [phone]  If 7PM-7AM, please contact night-coverage [email or web address] Password [email or web address] 03/29/2014, 1:27 PM

## 2014-03-29 NOTE — ED Notes (Signed)
Patient will give urine sample once [place] is done

## 2014-03-29 NOTE — Consult Note (Signed)
Consultation  Referring Provider: [facility].     Primary Care Physician:  Tawanna Solo, MD Primary Gastroenterologist: Has seen [facility] in [place].         Reason for Consultation:  Abdominal pain / abnormal LFTs            HPI:   Jenna Molina is a 58 y.o. female with a history of fibromyalgia, chronic fatigue, anxiety, depression, and migraine headaches. She is followed by Dr. Naaman Plummer with [facility]. Patient has been under evaluation for possible gallbladder disease. She presented to ED today with severe abdominal pain. Reports fevers at home. Patient takes Hydrocodone at home, Celebrex. Ultrasound suggests sludge vrs gallstone in gallbladder, fatty liver and CBD of 21m. Alk phos 217, AST 40 and ALT 116. Bili normal. Lipase mildly elevated at 100. WBC normal. She is afebrile. U/A okay.   [facility] evaluation for upper abdominal pain. Patient didn't want to return to them, [facility]. Patient reports findings of gastritis, "water in esophagus" and a non-cancerous polyps. The pain has been present since February. It is intermittent, mostly postprandial. Oats particularly cause pain. Episodes usually last 30 minutes or so but this current episode lasted 6 hours and pain wasn't preceded by eating oats. Also, this time pain radiated through to left back. Patient has a remote history of PUD so doesn't take NSAIDs.  Slowly losing weight because of nausea.  PCP recently ordered MRCP for evaluation of upper abdominal pain and dilated CBD. Patient has disc with her.   Past Medical History  Diagnosis Date  . Migraines   . Fibromyalgia   . Cancer     Cervical cancer    Past Surgical History  Procedure Laterality Date  . Inner ear surgery    . Abdominal hysterectomy    . Appendectomy    . Mastoidectomy    . Mole  removal      Family History  Problem Relation Age of Onset  . Cancer Mother   . Diabetes Mother   . Anxiety disorder Mother   . Emphysema Father   . Anxiety disorder Father   . Alcohol abuse Father   . COPD Father      History  Substance Use Topics  . Smoking status: Former Smoker    Quit date: 06/19/2008  . Smokeless tobacco: Not on file  . Alcohol Use: No    Prior to Admission medications   Medication Sig Start Date End Date Taking? Authorizing Provider  Acetaminophen 500 MG coapsule Take 250-500 mg by mouth 3 (three) times daily. Takes 2511mat 0030 and 0800 and 50038mt bedtime (1430)   Yes Historical Provider, MD  cholecalciferol (VITAMIN D) 1000 UNITS tablet Take 5,000 Units by mouth every morning.   Yes Historical Provider, MD  citalopram (CELEXA) 40 MG tablet Take 50 mg by mouth at bedtime.    Yes Historical Provider, MD  cyclobenzaprine (FLEXERIL) 10 MG tablet Take 2 tablets (20 mg total) by mouth at bedtime. 12/29/13  Yes ZacMeredith StaggersD  diphenhydrAMINE (BENADRYL) 25 mg capsule Take 25 mg by mouth every 12 (twelve) hours as needed for itching.  05/10/12  Yes John-Adam Bonk, MD  Docusate Calcium (STOOL SOFTENER PO) Take 50 mg by mouth at bedtime as needed.   Yes Historical Provider, MD  esomeprazole (NEXIUM) 40 MG capsule  Take 80 mg by mouth daily before breakfast.    Yes Historical Provider, MD  HYDROcodone-acetaminophen (NORCO) 10-325 MG per tablet Take 2 tablets by mouth every 8 (eight) hours. 03/02/14  Yes Meredith Staggers, MD  levothyroxine (SYNTHROID, LEVOTHROID) 100 MCG tablet Take 100 mcg by mouth every morning.   Yes Historical Provider, MD  LORazepam (ATIVAN) 1 MG tablet Take 1 mg by mouth every 6 (six) hours as needed. For anxiety   Yes Historical Provider, MD  Polyethyl Glycol-Propyl Glycol (SYSTANE OP) Place 2 drops into both eyes daily as needed (dryness).   Yes Historical Provider, MD  promethazine (PHENERGAN) 25 MG tablet Take 25 mg by mouth every 6  (six) hours as needed. For nausea   Yes Historical Provider, MD  SUMAtriptan (IMITREX) 100 MG tablet Take 100 mg by mouth every 2 (two) hours as needed. For migraine   Yes Historical Provider, MD  topiramate (TOPAMAX) 200 MG tablet Take 200 mg by mouth every morning.   Yes Historical Provider, MD  vitamin E 400 UNIT capsule Take 400 Units by mouth every morning.   Yes Historical Provider, MD  ALPRAZolam (XANAX) 0.25 MG tablet Take 1 tablet (0.25 mg total) by mouth every 8 (eight) hours as needed for anxiety. 02/10/14   Meredith Staggers, MD    Current Facility-Administered Medications  Medication Dose Route Frequency Provider Last Rate Last Dose  . 0.9 %  sodium chloride infusion   Intravenous Continuous Reyne Dumas, MD 100 mL/hr at 03/29/14 1256    . HYDROmorphone (DILAUDID) injection 0.5-1 mg  0.5-1 mg Intravenous Q3H PRN Reyne Dumas, MD      . levalbuterol (XOPENEX) nebulizer solution 0.63 mg  0.63 mg Nebulization Q6H PRN Reyne Dumas, MD      . ondansetron (ZOFRAN) tablet 4 mg  4 mg Oral Q6H PRN Reyne Dumas, MD       Or  . ondansetron (ZOFRAN) injection 4 mg  4 mg Intravenous Q6H PRN Reyne Dumas, MD      . oxyCODONE (Oxy IR/ROXICODONE) immediate release tablet 5 mg  5 mg Oral Q4H PRN Reyne Dumas, MD      . sodium chloride 0.9 % injection 3 mL  3 mL Intravenous Q12H Reyne Dumas, MD   3 mL at 03/29/14 1256   Current Outpatient Prescriptions  Medication Sig Dispense Refill  . Acetaminophen 500 MG coapsule Take 250-500 mg by mouth 3 (three) times daily. Takes 251m at 0030 and 0800 and 5060mat bedtime (1430)    . cholecalciferol (VITAMIN D) 1000 UNITS tablet Take 5,000 Units by mouth every morning.    . citalopram (CELEXA) 40 MG tablet Take 50 mg by mouth at bedtime.     . cyclobenzaprine (FLEXERIL) 10 MG tablet Take 2 tablets (20 mg total) by mouth at bedtime. 60 tablet 4  . diphenhydrAMINE (BENADRYL) 25 mg capsule Take 25 mg by mouth every 12 (twelve) hours as needed for itching.       . Mariane Baumgartenalcium (STOOL SOFTENER PO) Take 50 mg by mouth at bedtime as needed.    . Marland Kitchensomeprazole (NEXIUM) 40 MG capsule Take 80 mg by mouth daily before breakfast.     . HYDROcodone-acetaminophen (NORCO) 10-325 MG per tablet Take 2 tablets by mouth every 8 (eight) hours. 180 tablet 0  . levothyroxine (SYNTHROID, LEVOTHROID) 100 MCG tablet Take 100 mcg by mouth every morning.    . Marland KitchenORazepam (ATIVAN) 1 MG tablet Take 1 mg by mouth every 6 (six) hours as needed.  For anxiety    . Polyethyl Glycol-Propyl Glycol (SYSTANE OP) Place 2 drops into both eyes daily as needed (dryness).    . promethazine (PHENERGAN) 25 MG tablet Take 25 mg by mouth every 6 (six) hours as needed. For nausea    . SUMAtriptan (IMITREX) 100 MG tablet Take 100 mg by mouth every 2 (two) hours as needed. For migraine    . topiramate (TOPAMAX) 200 MG tablet Take 200 mg by mouth every morning.    . vitamin E 400 UNIT capsule Take 400 Units by mouth every morning.    Marland Kitchen ALPRAZolam (XANAX) 0.25 MG tablet Take 1 tablet (0.25 mg total) by mouth every 8 (eight) hours as needed for anxiety. 30 tablet 0    Allergies as of 03/29/2014 - Review Complete 03/29/2014  Allergen Reaction Noted  . Aspirin Nausea Only 05/09/2012  . Lyrica [pregabalin]  12/16/2012  . Sulfa antibiotics Hives 05/08/2012  . Adhesive [tape] Rash 02/24/2013  . Neosporin [neomycin-bacitracin zn-polymyx] Rash 02/24/2013     Review of Systems:    All systems reviewed and negative except where noted in HPI.   Physical Exam:  Vital signs in last 24 hours: Temp:  [97.7 F (36.5 C)-98 F (36.7 C)] 98 F (36.7 C) (12/14 1406) Pulse Rate:  [65-82] 65 (12/14 1406) Resp:  [16] 16 (12/14 1406) BP: (99-113)/(58-86) 99/58 mmHg (12/14 1406) SpO2:  [97 %-100 %] 97 % (12/14 1406)   General:   Pleasant  white female in NAD Head:  Normocephalic and atraumatic. Eyes:   No icterus.   Conjunctiva pink. Ears:  Normal auditory acuity. Neck:  Supple; no masses felt Lungs:   Respirations even and unlabored. Lungs clear to auscultation bilaterally.   No wheezes, crackles, or rhonchi.  Heart:  Regular rate and rhythm Abdomen:  Soft, nondistended, mild epigastric / RUQ tenderness. Normal bowel sounds. No appreciable masses or hepatomegaly.  Rectal:  Not performed.  Msk:  Symmetrical without gross deformities.  Extremities:  Without edema. Neurologic:  Alert and  oriented x4;  grossly normal neurologically. Skin:  Intact without significant lesions or rashes. Cervical Nodes:  No significant cervical adenopathy. Psych:  Alert and cooperative. Normal affect.  LAB RESULTS:  Recent Labs  03/29/14 1021  WBC 6.8  HGB 11.7*  HCT 35.7*  PLT 298   BMET  Recent Labs  03/29/14 1021  NA 134*  K 4.2  CL 98  CO2 23  GLUCOSE 118*  BUN 13  CREATININE 0.88  CALCIUM 9.5   LFT  Recent Labs  03/29/14 1021  PROT 7.3  ALBUMIN 3.7  AST 40*  ALT 116*  ALKPHOS 217*  BILITOT 0.9   PT/INR No results for input(s): LABPROT, INR in the last 72 hours.  STUDIES: US Abdomen Complete  03/29/2014   CLINICAL DATA:  Pain.  EXAM: ULTRASOUND ABDOMEN COMPLETE  COMPARISON:  03/26/2014.  FINDINGS: Gallbladder: Previously identified 5 mm nodular density is now mobile and most likely represents focal area of mobile sludge and/or gallstone. Gallbladder wall thickening has resolved. Gallbladder wall thickness is now 2 mm which is normal. Negative Murphy sign. No pericholecystic fluid collection.  Common bile duct: Diameter: 14 mm.  Liver: Liver echodense consistent with fatty infiltration. Focal fatty sparing again noted adjacent to the gallbladder.  IVC: No abnormality visualized.  Pancreas: Visualized portion unremarkable.  Spleen: Size and appearance within normal limits.  Right Kidney: Length: 10.0 cm. Echogenicity within normal limits. No mass or hydronephrosis visualized.  Left Kidney: Length: 10.2 cm. Echogenicity  within normal limits. No mass or hydronephrosis visualized.   Abdominal aorta: No aneurysm visualized.  Other findings: None.  IMPRESSION: 1. Progressive distention of the common bile duct. Common bile duct now measures up to 14 mm. MRCP should be considered for further evaluation. 2. Previously identified 5 mm non mobile density in the gallbladder is now mobile and most likely represent focal area of tumefactive sludge and/or gallstones. Gallbladder wall thickening has resolved. Negative Murphy sign. No pericholecystic fluid collection. 3. Fatty infiltration of the liver with focal fatty sparing adjacent to the gallbladder.   Electronically Signed   By: Marcello Moores  Register   On: 03/29/2014 11:40   PREVIOUS ENDOSCOPIES:            See HPI   Impression / Plan:   46. 58 year old female with several month history of intermittent postprandial upper abdominal pain. Now with CBD dilation, gallbladder sludge and rising LFTs. Lipase minimally elevated but doubt pancreatitis. We reviewed outside MRCP with our radiologist. Imaging suboptimal as MRI portion not done but CBD does appear dilated, c/w with ultrasound.  Rule of CBD stone / pancreatic mass, etc.. Will obtain cross sectional imaging with MRI / MRCP. Repeat labs in am. Clear liquids okay.   2. Hx of colon polyps, gets colonoscopies every 5 years (usually done at different facilities). Last one Sept 2015, a 49m "non-cancernous" polyps was found.   Thanks   LOS: 0 days   PTye Savoy 03/29/2014, 2:10 PM    ________________________________________________________________________  [facility] MD note:  I personally examined the patient, reviewed the data and agree with the assessment and plan described above.  Elevated liver tests, intermittent (somewhat biliary-like) abd pains, dilated bile duct.  Seems likely that she has GB, biliary issue here. I would like repeat imaging (MR abdomen with MRCP as well).  If indicated, ERCP will be arranged. If not, she may need general surgery evaluation to consider  cholecystectomy.   DOwens Loffler MD [facility]astroenter[facility] Pager [phone]

## 2014-03-30 ENCOUNTER — Inpatient Hospital Stay (HOSPITAL_COMMUNITY): Payer: Managed Care, Other (non HMO)

## 2014-03-30 DIAGNOSIS — E039 Hypothyroidism, unspecified: Secondary | ICD-10-CM

## 2014-03-30 LAB — CBC
HCT: 36.1 % (ref 36.0–46.0)
Hemoglobin: 11.6 g/dL — ABNORMAL LOW (ref 12.0–15.0)
MCH: 28.1 pg (ref 26.0–34.0)
MCHC: 32.1 g/dL (ref 30.0–36.0)
MCV: 87.4 fL (ref 78.0–100.0)
Platelets: 279 10*3/uL (ref 150–400)
RBC: 4.13 MIL/uL (ref 3.87–5.11)
RDW: 14.3 % (ref 11.5–15.5)
WBC: 6.5 10*3/uL (ref 4.0–10.5)

## 2014-03-30 LAB — TROPONIN I

## 2014-03-30 LAB — URINE CULTURE: Colony Count: 35000

## 2014-03-30 LAB — COMPREHENSIVE METABOLIC PANEL
ALT: 97 U/L — ABNORMAL HIGH (ref 0–35)
AST: 36 U/L (ref 0–37)
Albumin: 3.3 g/dL — ABNORMAL LOW (ref 3.5–5.2)
Alkaline Phosphatase: 189 U/L — ABNORMAL HIGH (ref 39–117)
Anion gap: 13 (ref 5–15)
BILIRUBIN TOTAL: 0.8 mg/dL (ref 0.3–1.2)
BUN: 9 mg/dL (ref 6–23)
CALCIUM: 8.9 mg/dL (ref 8.4–10.5)
CHLORIDE: 108 meq/L (ref 96–112)
CO2: 22 meq/L (ref 19–32)
CREATININE: 0.73 mg/dL (ref 0.50–1.10)
GFR calc non Af Amer: >90 mL/min (ref >90)
GLUCOSE: 92 mg/dL (ref 70–99)
Potassium: 4.2 mEq/L (ref 3.7–5.3)
Sodium: 143 mEq/L (ref 137–147)
Total Protein: 6.5 g/dL (ref 6.0–8.3)

## 2014-03-30 LAB — AMYLASE: Amylase: 115 U/L — ABNORMAL HIGH (ref 0–105)

## 2014-03-30 LAB — LIPASE, BLOOD: LIPASE: 97 U/L — AB (ref 11–59)

## 2014-03-30 MED ORDER — HEPARIN SODIUM (PORCINE) 5000 UNIT/ML IJ SOLN
5000.0000 [IU] | Freq: Three times a day (TID) | INTRAMUSCULAR | Status: DC
  Administered 2014-03-31 – 2014-04-03 (×9): 5000 [IU] via SUBCUTANEOUS
  Filled 2014-03-30 (×14): qty 1

## 2014-03-30 MED ORDER — GADOBENATE DIMEGLUMINE 529 MG/ML IV SOLN
15.0000 mL | Freq: Once | INTRAVENOUS | Status: AC | PRN
  Administered 2014-03-30: 11 mL via INTRAVENOUS

## 2014-03-30 MED ORDER — MUSCLE RUB 10-15 % EX CREA
TOPICAL_CREAM | CUTANEOUS | Status: DC | PRN
  Administered 2014-03-30: 05:00:00 via TOPICAL
  Filled 2014-03-30: qty 85

## 2014-03-30 NOTE — Progress Notes (Signed)
INITIAL NUTRITION ASSESSMENT  DOCUMENTATION CODES Per approved criteria  -Not Applicable   INTERVENTION: -Encourage PO intake -Diet advancement from clears per MD -RD to continue to monitor  NUTRITION DIAGNOSIS: Inadequate oral intake related to abdominal pain as evidenced by need for clear liquid diet.   Goal: Pt to meet >/= 90% of their estimated nutrition needs   Monitor:  PO intake, weight, labs, I/O's   Reason for Assessment: Pt identified as at nutrition risk on the Malnutrition Screen Tool  Admitting Dx: Abdominal pain  ASSESSMENT: 58 year old female with a history of cervical cancer status post total abdominal hysterectomy, recent EGD/colonoscopy at [facility] in [place], ECG showing gastritis, colonoscopy to showing some random polyps, who presents to the ER with abdominal pain. Ultrasound obtained shows cholelithiasis without evidence of cholecystitis, dilatation of the common bile duct.   Pt reports 2 lb weight loss. Per weight history documentation, pt has lost 11 lb since February, weight is slowly dropping. Pt reports going through episodes of low appetite d/t abdominal pain.  Currently on CL diet, ate some jello today. Pt reports being very hungry. States that after she ate the jello, her stomach was hurting again.  RD to follow-up with patient when diet is advanced.  Nutrition focused physical exam shows no sign of depletion of muscle mass or body fat.  Labs reviewed:WNL  Height: Ht Readings from Last 1 Encounters:  03/29/14 5\' 4"  (1.626 m)    Weight: Wt Readings from Last 1 Encounters:  03/29/14 126 lb 5.2 oz (57.3 kg)    Ideal Body Weight: 120 lb  % Ideal Body Weight: 105%  Wt Readings from Last 10 Encounters:  03/29/14 126 lb 5.2 oz (57.3 kg)  03/02/14 122 lb (55.339 kg)  12/29/13 126 lb 6.4 oz (57.335 kg)  11/10/13 126 lb (57.153 kg)  09/11/13 129 lb 6.4 oz (58.695 kg)  07/14/13 130 lb (58.968 kg)  05/20/13 137 lb  (62.143 kg)  03/25/13 137 lb 6.4 oz (62.324 kg)  02/24/13 137 lb (62.143 kg)  01/14/13 136 lb (61.689 kg)    Usual Body Weight: 125 lb  -per pt  % Usual Body Weight: 101%  BMI:  Body mass index is 21.67 kg/(m^2).  Estimated Nutritional Needs: Kcal: 1400-1600 Protein: 65-75g Fluid: 1.5L/day  Skin: intact  Diet Order: Diet clear liquid  EDUCATION NEEDS: -No education needs identified at this time   Intake/Output Summary (Last 24 hours) at 03/30/14 1353 Last data filed at 03/30/14 0615  Gross per 24 hour  Intake   2345 ml  Output      0 ml  Net   2345 ml    Last BM: 12/11  Labs:   Recent Labs Lab 03/29/14 1021 03/30/14 0128  NA 134* 143  K 4.2 4.2  CL 98 108  CO2 23 22  BUN 13 9  CREATININE 0.88 0.73  CALCIUM 9.5 8.9  GLUCOSE 118* 92    CBG (last 3)  No results for input(s): GLUCAP in the last 72 hours.  Scheduled Meds: . citalopram  40 mg Oral QHS  . cyclobenzaprine  20 mg Oral QHS  . docusate sodium  50 mg Oral Daily  . levothyroxine  100 mcg Oral QAC breakfast  . pantoprazole  40 mg Oral Daily  . sodium chloride  3 mL Intravenous Q12H  . topiramate  200 mg Oral q morning - 10a    Continuous Infusions: . sodium chloride 100 mL/hr at 03/30/14 0114    Past Medical  History  Diagnosis Date  . Migraines   . Fibromyalgia   . Cancer     Cervical cancer    Past Surgical History  Procedure Laterality Date  . Inner ear surgery    . Abdominal hysterectomy    . Appendectomy    . Mastoidectomy    . Mole removal      Clayton Bibles, MS, RD, LDN Pager: [phone] After Hours Pager: [phone]

## 2014-03-30 NOTE — Progress Notes (Signed)
Patient Demographics  Jenna Molina, is a 58 y.o. female, DOB - 1955/10/20, OIZ:[id number]  Admit date - 03/29/2014   Admitting Physician Reyne Dumas, MD  Outpatient Primary MD for the patient is Tawanna Solo, MD  LOS - 1   Chief Complaint  Patient presents with  . Abdominal Pain      Admission history of present illness/brief narrative:  58 year old female with history of fibromyalgia, chronic fatigue, anxiety, depression, migraine headaches, presents with complaints of abdominal pain, fever at home, ultrasound done showing sludge versus gallstones in gallbladder, with CBD or 14 mm, medicine recently at [facility], had EGD/colonoscopy, significant for gastritis, and nonmalignant polyps, patient presents patient report she recently had MRCP to evaluate upper abdominal pain, where she had dilated CPD, she brought to the discal with her, which were evaluated by GI service, but they were poor quality, as well the MRI part was not done, so patient is currently admitted where she is getting MRCP done for further evaluation. Subjective:   Leiah Giannotti today has, No headache, No chest pain, still complaining off abdominal pain - No Nausea, No new weakness tingling or numbness, No Cough - SOB.   Assessment & Plan    Active Problems:   Abdominal pain, acute   Abdominal pain  Right upper quadrant abdominal pain -CBD dilatation on ultrasound, LFTs are improving, awaiting MRI with MRCP results, may require surgical consult for consideration of cholecystectomy dependent on MRCP finding. -Continue with clear liquid diet -GI consult appreciated.  Hypothyroid -Continue with Synthroid  Depression -Continue with citalopram  Fibromyalgia -Continue with home medication  Code Status: Full code  Family Communication: None at bedside  Disposition Plan: Remains  inpatient   Procedures  Pending MRCP   Consults  Gastroenterology   Medications  Scheduled Meds: . citalopram  40 mg Oral QHS  . cyclobenzaprine  20 mg Oral QHS  . docusate sodium  50 mg Oral Daily  . levothyroxine  100 mcg Oral QAC breakfast  . pantoprazole  40 mg Oral Daily  . sodium chloride  3 mL Intravenous Q12H  . topiramate  200 mg Oral q morning - 10a   Continuous Infusions: . sodium chloride 100 mL/hr at 03/30/14 0114   PRN Meds:.HYDROmorphone (DILAUDID) injection, levalbuterol, LORazepam, MUSCLE RUB, ondansetron **OR** ondansetron (ZOFRAN) IV, oxyCODONE  DVT Prophylaxis  subcutaneous heparin  Lab Results  Component Value Date   PLT 279 03/30/2014    Antibiotics    Anti-infectives    None          Objective:   Filed Vitals:   03/29/14 1700 03/29/14 2132 03/30/14 0614 03/30/14 1428  BP:  97/56 105/59 112/67  Pulse:  64 66 64  Temp:  97.4 F (36.3 C) 98 F (36.7 C) 98.3 F (36.8 C)  TempSrc:  Oral Oral Oral  Resp:  16 16 18   Height: 5\' 4"  (1.626 m)     Weight: 57.3 kg (126 lb 5.2 oz)     SpO2:  98% 97% 97%    Wt Readings from Last 3 Encounters:  03/29/14 57.3 kg (126 lb 5.2 oz)  03/02/14 55.339 kg (122 lb)  12/29/13 57.335 kg (126 lb 6.4 oz)     Intake/Output Summary (Last 24 hours) at  03/30/14 1722 Last data filed at 03/30/14 0615  Gross per 24 hour  Intake   2345 ml  Output      0 ml  Net   2345 ml     Physical Exam  Awake Alert, Oriented X 3, No new F.N deficits, Normal affect [place].AT,PERRAL Supple Neck,No JVD, No cervical lymphadenopathy appriciated.  Symmetrical Chest wall movement, Good air movement bilaterally, CTAB RRR,No Gallops,Rubs or new Murmurs, No Parasternal Heave +ve B.Sounds, Abd Soft, mild right upper quadrant tenderness, No organomegaly appriciated, No rebound - guarding or rigidity. No Cyanosis, Clubbing or edema, No new Rash or bruise     Data Review   Micro Results Recent Results (from the past 240  hour(s))  Urine culture     Status: None   Collection Time: 03/29/14 11:33 AM  Result Value Ref Range Status   Specimen Description URINE, RANDOM  Final   Special Requests NONE  Final   Culture  Setup Time   Final    03/29/2014 15:43 Performed at [facility]   Final    35,000 COLONIES/ML Performed at [facility]    Culture   Final    LACTOBACILLUS SPECIES Note: Standardized susceptibility testing for this organism is not available. Performed at [facility]    Report Status 03/30/2014 FINAL  Final    Radiology Reports US Abdomen Complete  03/29/2014   CLINICAL DATA:  Pain.  EXAM: ULTRASOUND ABDOMEN COMPLETE  COMPARISON:  03/26/2014.  FINDINGS: Gallbladder: Previously identified 5 mm nodular density is now mobile and most likely represents focal area of mobile sludge and/or gallstone. Gallbladder wall thickening has resolved. Gallbladder wall thickness is now 2 mm which is normal. Negative Murphy sign. No pericholecystic fluid collection.  Common bile duct: Diameter: 14 mm.  Liver: Liver echodense consistent with fatty infiltration. Focal fatty sparing again noted adjacent to the gallbladder.  IVC: No abnormality visualized.  Pancreas: Visualized portion unremarkable.  Spleen: Size and appearance within normal limits.  Right Kidney: Length: 10.0 cm. Echogenicity within normal limits. No mass or hydronephrosis visualized.  Left Kidney: Length: 10.2 cm. Echogenicity within normal limits. No mass or hydronephrosis visualized.  Abdominal aorta: No aneurysm visualized.  Other findings: None.  IMPRESSION: 1. Progressive distention of the common bile duct. Common bile duct now measures up to 14 mm. MRCP should be considered for further evaluation. 2. Previously identified 5 mm non mobile density in the gallbladder is now mobile and most likely represent focal area of tumefactive sludge and/or gallstones. Gallbladder wall thickening has resolved. Negative  Murphy sign. No pericholecystic fluid collection. 3. Fatty infiltration of the liver with focal fatty sparing adjacent to the gallbladder.   Electronically Signed   By: Marcello Moores  Register   On: 03/29/2014 11:40   Mr Outside Films Chest  03/29/2014   This examination belongs to an outside facility and is stored  here for comparison purposes only.  Contact the originating outside  institution for any associated report or interpretation.   CBC  Recent Labs Lab 03/29/14 1021 03/30/14 0128  WBC 6.8 6.5  HGB 11.7* 11.6*  HCT 35.7* 36.1  PLT 298 279  MCV 85.6 87.4  MCH 28.1 28.1  MCHC 32.8 32.1  RDW 14.3 14.3  LYMPHSABS 1.9  --   MONOABS 0.8  --   EOSABS 0.1  --   BASOSABS 0.0  --     Chemistries   Recent Labs Lab 03/29/14 1021 03/30/14 0128  NA  134* 143  K 4.2 4.2  CL 98 108  CO2 23 22  GLUCOSE 118* 92  BUN 13 9  CREATININE 0.88 0.73  CALCIUM 9.5 8.9  AST 40* 36  ALT 116* 97*  ALKPHOS 217* 189*  BILITOT 0.9 0.8   ------------------------------------------------------------------------------------------------------------------ estimated creatinine clearance is 64.6 mL/min (by C-G formula based on Cr of 0.73). ------------------------------------------------------------------------------------------------------------------  Recent Labs  03/29/14 1316  HGBA1C 5.9*   ------------------------------------------------------------------------------------------------------------------ No results for input(s): CHOL, HDL, LDLCALC, TRIG, CHOLHDL, LDLDIRECT in the last 72 hours. ------------------------------------------------------------------------------------------------------------------  Recent Labs  03/29/14 1317  TSH 3.640   ------------------------------------------------------------------------------------------------------------------ No results for input(s): VITAMINB12, FOLATE, FERRITIN, TIBC, IRON, RETICCTPCT in the last 72 hours.  Coagulation profile No  results for input(s): INR, PROTIME in the last 168 hours.  No results for input(s): DDIMER in the last 72 hours.  Cardiac Enzymes  Recent Labs Lab 03/29/14 1316 03/29/14 1911 03/30/14 0128  TROPONINI <0.30 <0.30 <0.30   ------------------------------------------------------------------------------------------------------------------ Invalid input(s): POCBNP     Time Spent in minutes   30 minutes   Alashia Brownfield M.D on 03/30/2014 at 5:22 PM  Between 7am to 7pm - Pager - [phone]  After 7pm go to [email or web address] - password [email or web address]  And look for the night coverage person covering for me after hours  [facility] Office  [phone]   **Disclaimer: This note may have been dictated with voice recognition software. Similar sounding words can inadvertently be transcribed and this note may contain transcription errors which may not have been corrected upon publication of note.**

## 2014-03-30 NOTE — Progress Notes (Signed)
Progress Note   Subjective  no further episodes of abdominal pain.    Objective   Vital signs in last 24 hours: Temp:  [97.4 F (36.3 C)-98.6 F (37 C)] 98 F (36.7 C) (12/15 0614) Pulse Rate:  [64-82] 66 (12/15 0614) Resp:  [16-20] 16 (12/15 0614) BP: (97-115)/(56-86) 105/59 mmHg (12/15 0614) SpO2:  [97 %-100 %] 97 % (12/15 0614) Weight:  [126 lb 5.2 oz (57.3 kg)] 126 lb 5.2 oz (57.3 kg) (12/14 1700) Last BM Date: 03/26/14 General:    Pleasant white female in NAD Heart:  Regular rate and rhythm Abdomen:  Soft, nondistended, mild to moderate RUQ tenderness. Extremities:  Without edema. Neurologic:  Alert and oriented,  grossly normal neurologically. Psych:  Cooperative. Normal mood and affect.  Lab Results:  Recent Labs  03/29/14 1021 03/30/14 0128  WBC 6.8 6.5  HGB 11.7* 11.6*  HCT 35.7* 36.1  PLT 298 279   BMET  Recent Labs  03/29/14 1021 03/30/14 0128  NA 134* 143  K 4.2 4.2  CL 98 108  CO2 23 22  GLUCOSE 118* 92  BUN 13 9  CREATININE 0.88 0.73  CALCIUM 9.5 8.9   LFT  Recent Labs  03/30/14 0128  PROT 6.5  ALBUMIN 3.3*  AST 36  ALT 97*  ALKPHOS 189*  BILITOT 0.8   Studies/Results: US Abdomen Complete  03/29/2014   CLINICAL DATA:  Pain.  EXAM: ULTRASOUND ABDOMEN COMPLETE  COMPARISON:  03/26/2014.  FINDINGS: Gallbladder: Previously identified 5 mm nodular density is now mobile and most likely represents focal area of mobile sludge and/or gallstone. Gallbladder wall thickening has resolved. Gallbladder wall thickness is now 2 mm which is normal. Negative Murphy sign. No pericholecystic fluid collection.  Common bile duct: Diameter: 14 mm.  Liver: Liver echodense consistent with fatty infiltration. Focal fatty sparing again noted adjacent to the gallbladder.  IVC: No abnormality visualized.  Pancreas: Visualized portion unremarkable.  Spleen: Size and appearance within normal limits.  Right Kidney: Length: 10.0 cm. Echogenicity within normal  limits. No mass or hydronephrosis visualized.  Left Kidney: Length: 10.2 cm. Echogenicity within normal limits. No mass or hydronephrosis visualized.  Abdominal aorta: No aneurysm visualized.  Other findings: None.  IMPRESSION: 1. Progressive distention of the common bile duct. Common bile duct now measures up to 14 mm. MRCP should be considered for further evaluation. 2. Previously identified 5 mm non mobile density in the gallbladder is now mobile and most likely represent focal area of tumefactive sludge and/or gallstones. Gallbladder wall thickening has resolved. Negative Murphy sign. No pericholecystic fluid collection. 3. Fatty infiltration of the liver with focal fatty sparing adjacent to the gallbladder.   Electronically Signed   By: Marcello Moores  Register   On: 03/29/2014 11:40   Mr Outside Films Chest  03/29/2014   This examination belongs to an outside facility and is stored  here for comparison purposes only.  Contact the originating outside  institution for any associated report or interpretation.     Assessment / Plan:     58 year old female with intermittent postprandial abdominal pain, abnormal LFTs, CBD dilation.  No further abdominal pain. LFTs improving. Awaiting MRI with MRCP. Most likely needs surgical consult soon for consideration of cholecystectomy.     LOS: 1 day   Tye Savoy  03/30/2014, 8:59 AM   ________________________________________________________________________  Velora Heckler GI MD note:  I personally examined the patient, reviewed the data and agree with the assessment and plan described above.  MRI  still not done yet. She feels pretty well, tolerating liquid diet without abd pain, n or vomiting.    Owens Loffler, MD [facility] Pager [phone]

## 2014-03-31 DIAGNOSIS — R1011 Right upper quadrant pain: Secondary | ICD-10-CM | POA: Diagnosis present

## 2014-03-31 DIAGNOSIS — E039 Hypothyroidism, unspecified: Secondary | ICD-10-CM | POA: Diagnosis present

## 2014-03-31 DIAGNOSIS — R100 Acute abdomen: Secondary | ICD-10-CM

## 2014-03-31 DIAGNOSIS — G43909 Migraine, unspecified, not intractable, without status migrainosus: Secondary | ICD-10-CM | POA: Diagnosis present

## 2014-03-31 DIAGNOSIS — G43009 Migraine without aura, not intractable, without status migrainosus: Secondary | ICD-10-CM

## 2014-03-31 LAB — BASIC METABOLIC PANEL
Anion gap: 10 (ref 5–15)
BUN: 6 mg/dL (ref 6–23)
CALCIUM: 9.2 mg/dL (ref 8.4–10.5)
CO2: 26 mEq/L (ref 19–32)
CREATININE: 0.8 mg/dL (ref 0.50–1.10)
Chloride: 107 mEq/L (ref 96–112)
GFR, EST NON AFRICAN AMERICAN: 79 mL/min — AB (ref >90)
Glucose, Bld: 102 mg/dL — ABNORMAL HIGH (ref 70–99)
Potassium: 3.9 mEq/L (ref 3.7–5.3)
Sodium: 143 mEq/L (ref 137–147)

## 2014-03-31 LAB — HEPATIC FUNCTION PANEL
ALT: 75 U/L — ABNORMAL HIGH (ref 0–35)
AST: 25 U/L (ref 0–37)
Albumin: 3.5 g/dL (ref 3.5–5.2)
Alkaline Phosphatase: 176 U/L — ABNORMAL HIGH (ref 39–117)
BILIRUBIN DIRECT: 0.3 mg/dL (ref 0.0–0.3)
BILIRUBIN TOTAL: 0.8 mg/dL (ref 0.3–1.2)
Indirect Bilirubin: 0.5 mg/dL (ref 0.3–0.9)
Total Protein: 6.5 g/dL (ref 6.0–8.3)

## 2014-03-31 LAB — CBC
HEMATOCRIT: 34.3 % — AB (ref 36.0–46.0)
Hemoglobin: 11 g/dL — ABNORMAL LOW (ref 12.0–15.0)
MCH: 28.1 pg (ref 26.0–34.0)
MCHC: 32.1 g/dL (ref 30.0–36.0)
MCV: 87.5 fL (ref 78.0–100.0)
Platelets: 314 10*3/uL (ref 150–400)
RBC: 3.92 MIL/uL (ref 3.87–5.11)
RDW: 14.4 % (ref 11.5–15.5)
WBC: 6.4 10*3/uL (ref 4.0–10.5)

## 2014-03-31 LAB — SURGICAL PCR SCREEN
MRSA, PCR: NEGATIVE
Staphylococcus aureus: NEGATIVE

## 2014-03-31 MED ORDER — SUMATRIPTAN SUCCINATE 100 MG PO TABS
100.0000 mg | ORAL_TABLET | ORAL | Status: DC | PRN
  Administered 2014-03-31 – 2014-04-02 (×3): 100 mg via ORAL
  Filled 2014-03-31 (×7): qty 1

## 2014-03-31 MED ORDER — PROMETHAZINE HCL 25 MG PO TABS
25.0000 mg | ORAL_TABLET | Freq: Four times a day (QID) | ORAL | Status: DC | PRN
  Administered 2014-03-31 – 2014-04-01 (×2): 25 mg via ORAL
  Filled 2014-03-31 (×3): qty 1

## 2014-03-31 NOTE — Consult Note (Signed)
Reason for Consult:  Abdominal pain and cholelithiasis Referring Physician: Dr. Owens Loffler  Jenna Molina is an 58 y.o. female.  HPI: Pt admitted with abdominal pain, this has been on and off since February,2015.  Marland Kitchen  She has seen [facility],  2 months ago, and then by [facility] in [facility].  She has had EGD / colonoscopy and reports of gastrits, non cancerous polyps.  The pain she complains about comes mostly with eating, episodes last from 30 minutes to 6 hours. The last episode was after [employer] with cheese.    She reports last episode lasted 6 hours,with nausea.  She has had intermittent LFT elevations. Mild lipase elevations, Ultrasound on this 03/26/14 shows; 5.5 mm gallbladder polyp versus tumefactive sludge , GB wall 3.4 mm, CBD 10.6 mm.   Another study show CBD, 3m on 03/29/14 Progressive distention of the common bile duct. Common bile duct now measures up to 14 mm. MRCP should be considered for further evaluation.  Previously identified 5 mm non mobile density in the gallbladder is now mobile and most likely represent focal area of tumefactive sludge and/or gallstones. Gallbladder wall thickening has resolved.  Fatty infiltration of the liver with focal fatty sparing adjacent to the gallbladder.  MRCP limited study on 03/30/14 shows; unusual in that there is dilatation of the common bile duct and common hepatic ducts, but there is no intrahepatic biliary ductal dilatation. Possible ductal stones, but this may be flow related.  5 mm filling defect in the gallbladder fundus compatible with a small gallstone or biliary sludge ball. No current findings to suggest an acute cholecystitis at this time. Dr. JArdis Hughsask uKoreato see and consider cholecystectomy with IOC, he does not think ERCP helpful at this junction.  Labs show lipase 100, on 03/29/14, mild ALT and AST elevations coming down.  Past Medical History  Diagnosis Date  Migraines   Chronic pain followed by Dr. ZMeredith Staggers MD    Fibromyalgia     Hx of tobacco use  38 years 1-1.5 PPD     Depression/anxiety disorder   IBS with chronic constipation   Cancer of the Cervix with hysterectomy, BSSO, lymph node dissection 01/1981  Hypothyroid  Mitral valve prolapse      Past Surgical History  Procedure Laterality Date  . Inner ear surgery    . Abdominal hysterectomy    . Appendectomy    . Mastoidectomy    . Mole removal      Family History  Problem Relation Age of Onset  . Cancer Mother   . Diabetes Mother   . Anxiety disorder Mother   . Emphysema Father   . Anxiety disorder Father   . Alcohol abuse Father   . COPD Father     Social History:  reports that she quit smoking about 5 years ago. She has never used smokeless tobacco. She reports that she does not drink alcohol or use illicit drugs.  Allergies:  Allergies  Allergen Reactions  . Aspirin Nausea Only  . Lyrica [Pregabalin]     Involuntary movement  . Sulfa Antibiotics Hives  . Adhesive [Tape] Rash  . Neosporin [Neomycin-Bacitracin Zn-Polymyx] Rash    Medications:  Prior to Admission:  Prescriptions prior to admission  Medication Sig Dispense Refill Last Dose  . Acetaminophen 500 MG coapsule Take 250-500 mg by mouth 3 (three) times daily. Takes 2521mat 0030 and 0800 and 50067mt bedtime (1430)   03/29/2014 at Unknown time  . cholecalciferol (VITAMIN  D) 1000 UNITS tablet Take 5,000 Units by mouth every morning.   03/29/2014 at Unknown time  . citalopram (CELEXA) 40 MG tablet Take 40 mg by mouth at bedtime.    03/28/2014 at Unknown time  . cyclobenzaprine (FLEXERIL) 10 MG tablet Take 2 tablets (20 mg total) by mouth at bedtime. 60 tablet 4 03/28/2014 at Unknown time  . diphenhydrAMINE (BENADRYL) 25 mg capsule Take 25 mg by mouth every 12 (twelve) hours as needed for itching.    unknown at unknown  . Docusate Calcium (STOOL SOFTENER PO) Take 50 mg by mouth at bedtime as needed.   03/28/2014 at Unknown time  . esomeprazole (NEXIUM) 40 MG  capsule Take 80 mg by mouth daily before breakfast.    03/29/2014 at Unknown time  . HYDROcodone-acetaminophen (NORCO) 10-325 MG per tablet Take 2 tablets by mouth every 8 (eight) hours. 180 tablet 0 03/29/2014 at Unknown time  . levothyroxine (SYNTHROID, LEVOTHROID) 100 MCG tablet Take 100 mcg by mouth every morning.   03/29/2014 at Unknown time  . LORazepam (ATIVAN) 1 MG tablet Take 1 mg by mouth every 6 (six) hours as needed. For anxiety   03/29/2014 at Unknown time  . Polyethyl Glycol-Propyl Glycol (SYSTANE OP) Place 2 drops into both eyes daily as needed (dryness).   03/28/2014 at Unknown time  . promethazine (PHENERGAN) 25 MG tablet Take 25 mg by mouth every 6 (six) hours as needed. For nausea   03/29/2014 at Unknown time  . SUMAtriptan (IMITREX) 100 MG tablet Take 100 mg by mouth every 2 (two) hours as needed. For migraine   03/29/2014 at Unknown time  . topiramate (TOPAMAX) 200 MG tablet Take 200 mg by mouth every morning.   03/29/2014 at Unknown time  . vitamin E 400 UNIT capsule Take 400 Units by mouth every morning.   03/29/2014 at Unknown time  . ALPRAZolam (XANAX) 0.25 MG tablet Take 1 tablet (0.25 mg total) by mouth every 8 (eight) hours as needed for anxiety. (Patient not taking: Reported on 03/29/2014) 30 tablet 0 Taking   Scheduled: . citalopram  40 mg Oral QHS  . cyclobenzaprine  20 mg Oral QHS  . docusate sodium  50 mg Oral Daily  . heparin subcutaneous  5,000 Units Subcutaneous Q8H  . levothyroxine  100 mcg Oral QAC breakfast  . pantoprazole  40 mg Oral Daily  . sodium chloride  3 mL Intravenous Q12H  . topiramate  200 mg Oral q morning - 10a   Continuous: . sodium chloride 100 mL/hr at 03/31/14 0700   ZYS:AYTKZSWFUXNAT (DILAUDID) injection, levalbuterol, LORazepam, MUSCLE RUB, ondansetron **OR** ondansetron (ZOFRAN) IV, oxyCODONE Anti-infectives    None      Results for orders placed or performed during the hospital encounter of 03/29/14 (from the past 48 hour(s))   Urinalysis, Routine w reflex microscopic     Status: None   Collection Time: 03/29/14 11:20 AM  Result Value Ref Range   Color, Urine YELLOW YELLOW   APPearance CLEAR CLEAR   Specific Gravity, Urine 1.005 1.005 - 1.030   pH 6.0 5.0 - 8.0   Glucose, UA NEGATIVE NEGATIVE mg/dL   Hgb urine dipstick NEGATIVE NEGATIVE   Bilirubin Urine NEGATIVE NEGATIVE   Ketones, ur NEGATIVE NEGATIVE mg/dL   Protein, ur NEGATIVE NEGATIVE mg/dL   Urobilinogen, UA 0.2 0.0 - 1.0 mg/dL   Nitrite NEGATIVE NEGATIVE   Leukocytes, UA NEGATIVE NEGATIVE    Comment: MICROSCOPIC NOT DONE ON URINES WITH NEGATIVE PROTEIN, BLOOD, LEUKOCYTES, NITRITE, OR  GLUCOSE <1000 mg/dL.  Urine culture     Status: None   Collection Time: 03/29/14 11:33 AM  Result Value Ref Range   Specimen Description URINE, RANDOM    Special Requests NONE    Culture  Setup Time      03/29/2014 15:43 Performed at [facility]      35,000 COLONIES/ML Performed at [place] Note: Standardized susceptibility testing for this organism is not available. Performed at [facility]    Report Status 03/30/2014 FINAL   Troponin I     Status: None   Collection Time: 03/29/14  1:16 PM  Result Value Ref Range   Troponin I <0.30 <0.30 ng/mL    Comment:        Due to the release kinetics of cTnI, a negative result within the first hours of the onset of symptoms does not rule out myocardial infarction with certainty. If myocardial infarction is still suspected, repeat the test at appropriate intervals.   Hemoglobin A1c     Status: Abnormal   Collection Time: 03/29/14  1:16 PM  Result Value Ref Range   Hgb A1c MFr Bld 5.9 (H) <5.7 %    Comment: (NOTE)                                                                       According to the ADA Clinical Practice Recommendations for 2011, when HbA1c is used as a screening test:  >=6.5%   Diagnostic of Diabetes  [place]           (if abnormal result is confirmed) 5.7-6.4%   Increased risk of developing Diabetes Mellitus References:Diagnosis and Classification of Diabetes Mellitus,Diabetes TDSK,8768,11(XBWIO 1):S62-S69 and Standards of Medical Care in         Diabetes - 2011,Diabetes Care,2011,34 (Suppl 1):S11-S61.    Mean Plasma Glucose 123 (H) <117 mg/dL    Comment: Performed at [facility]  Acetaminophen level     Status: None   Collection Time: 03/29/14  1:16 PM  Result Value Ref Range   Acetaminophen (Tylenol), Serum <15.0 10 - 30 ug/mL    Comment:        THERAPEUTIC CONCENTRATIONS VARY SIGNIFICANTLY. A RANGE OF 10-30 ug/mL MAY BE AN EFFECTIVE CONCENTRATION FOR MANY PATIENTS. HOWEVER, SOME ARE BEST TREATED AT CONCENTRATIONS OUTSIDE THIS RANGE. ACETAMINOPHEN CONCENTRATIONS >150 ug/mL AT 4 HOURS AFTER INGESTION AND >50 ug/mL AT 12 HOURS AFTER INGESTION ARE OFTEN ASSOCIATED WITH TOXIC REACTIONS.   Hepatitis panel, acute     Status: None   Collection Time: 03/29/14  1:16 PM  Result Value Ref Range   Hepatitis B Surface Ag NEGATIVE NEGATIVE   HCV Ab NEGATIVE NEGATIVE   Hep A IgM NON REACTIVE NON REACTIVE    Comment: (NOTE) Effective March 01, 2014, Hepatitis Acute Panel (test code [phone]) will be revised to automatically reflex to the Hepatitis C Viral RNA, Quantitative, Real-Time PCR assay if the Hepatitis C antibody screening result is Reactive. This action is being taken to ensure that the CDC/USPSTF recommended HCV diagnostic algorithm with the appropriate test reflex needed for accurate interpretation is followed.    Hep B  C IgM NON REACTIVE NON REACTIVE    Comment: (NOTE) High levels of Hepatitis B Core IgM antibody are detectable during the acute stage of Hepatitis B. This antibody is used to differentiate current from past HBV infection. Performed at [facility]   TSH     Status: None   Collection Time: 03/29/14  1:17 PM  Result Value Ref Range    TSH 3.640 0.350 - 4.500 uIU/mL    Comment: Performed at [facility]  Troponin I     Status: None   Collection Time: 03/29/14  7:11 PM  Result Value Ref Range   Troponin I <0.30 <0.30 ng/mL    Comment:        Due to the release kinetics of cTnI, a negative result within the first hours of the onset of symptoms does not rule out myocardial infarction with certainty. If myocardial infarction is still suspected, repeat the test at appropriate intervals.   Troponin I     Status: None   Collection Time: 03/30/14  1:28 AM  Result Value Ref Range   Troponin I <0.30 <0.30 ng/mL    Comment:        Due to the release kinetics of cTnI, a negative result within the first hours of the onset of symptoms does not rule out myocardial infarction with certainty. If myocardial infarction is still suspected, repeat the test at appropriate intervals.   Comprehensive metabolic panel     Status: Abnormal   Collection Time: 03/30/14  1:28 AM  Result Value Ref Range   Sodium 143 137 - 147 mEq/L    Comment: DELTA CHECK NOTED REPEATED TO VERIFY    Potassium 4.2 3.7 - 5.3 mEq/L   Chloride 108 96 - 112 mEq/L    Comment: DELTA CHECK NOTED REPEATED TO VERIFY    CO2 22 19 - 32 mEq/L   Glucose, Bld 92 70 - 99 mg/dL   BUN 9 6 - 23 mg/dL   Creatinine, Ser 0.73 0.50 - 1.10 mg/dL   Calcium 8.9 8.4 - 10.5 mg/dL   Total Protein 6.5 6.0 - 8.3 g/dL   Albumin 3.3 (L) 3.5 - 5.2 g/dL   AST 36 0 - 37 U/L   ALT 97 (H) 0 - 35 U/L   Alkaline Phosphatase 189 (H) 39 - 117 U/L   Total Bilirubin 0.8 0.3 - 1.2 mg/dL   GFR calc non Af Amer >90 >90 mL/min   GFR calc Af Amer >90 >90 mL/min    Comment: (NOTE) The eGFR has been calculated using the CKD EPI equation. This calculation has not been validated in all clinical situations. eGFR's persistently <90 mL/min signify possible Chronic Kidney Disease.    Anion gap 13 5 - 15  CBC     Status: Abnormal   Collection Time: 03/30/14  1:28 AM  Result Value  Ref Range   WBC 6.5 4.0 - 10.5 K/uL   RBC 4.13 3.87 - 5.11 MIL/uL   Hemoglobin 11.6 (L) 12.0 - 15.0 g/dL   HCT 36.1 36.0 - 46.0 %   MCV 87.4 78.0 - 100.0 fL   MCH 28.1 26.0 - 34.0 pg   MCHC 32.1 30.0 - 36.0 g/dL   RDW 14.3 11.5 - 15.5 %   Platelets 279 150 - 400 K/uL  Lipase, blood     Status: Abnormal   Collection Time: 03/30/14  1:28 AM  Result Value Ref Range   Lipase 97 (H) 11 - 59 U/L  Amylase  Status: Abnormal   Collection Time: 03/30/14  1:28 AM  Result Value Ref Range   Amylase 115 (H) 0 - 105 U/L  CBC     Status: Abnormal   Collection Time: 03/31/14  4:38 AM  Result Value Ref Range   WBC 6.4 4.0 - 10.5 K/uL   RBC 3.92 3.87 - 5.11 MIL/uL   Hemoglobin 11.0 (L) 12.0 - 15.0 g/dL   HCT 34.3 (L) 36.0 - 46.0 %   MCV 87.5 78.0 - 100.0 fL   MCH 28.1 26.0 - 34.0 pg   MCHC 32.1 30.0 - 36.0 g/dL   RDW 14.4 11.5 - 15.5 %   Platelets 314 150 - 400 K/uL  Basic metabolic panel     Status: Abnormal   Collection Time: 03/31/14  4:38 AM  Result Value Ref Range   Sodium 143 137 - 147 mEq/L   Potassium 3.9 3.7 - 5.3 mEq/L   Chloride 107 96 - 112 mEq/L   CO2 26 19 - 32 mEq/L   Glucose, Bld 102 (H) 70 - 99 mg/dL   BUN 6 6 - 23 mg/dL   Creatinine, Ser 0.80 0.50 - 1.10 mg/dL   Calcium 9.2 8.4 - 10.5 mg/dL   GFR calc non Af Amer 79 (L) >90 mL/min   GFR calc Af Amer >90 >90 mL/min    Comment: (NOTE) The eGFR has been calculated using the CKD EPI equation. This calculation has not been validated in all clinical situations. eGFR's persistently <90 mL/min signify possible Chronic Kidney Disease.    Anion gap 10 5 - 15  Hepatic function panel     Status: Abnormal   Collection Time: 03/31/14  4:38 AM  Result Value Ref Range   Total Protein 6.5 6.0 - 8.3 g/dL   Albumin 3.5 3.5 - 5.2 g/dL   AST 25 0 - 37 U/L   ALT 75 (H) 0 - 35 U/L   Alkaline Phosphatase 176 (H) 39 - 117 U/L   Total Bilirubin 0.8 0.3 - 1.2 mg/dL   Bilirubin, Direct 0.3 0.0 - 0.3 mg/dL   Indirect Bilirubin 0.5  0.3 - 0.9 mg/dL    US Abdomen Complete  03/29/2014   CLINICAL DATA:  Pain.  EXAM: ULTRASOUND ABDOMEN COMPLETE  COMPARISON:  03/26/2014.  FINDINGS: Gallbladder: Previously identified 5 mm nodular density is now mobile and most likely represents focal area of mobile sludge and/or gallstone. Gallbladder wall thickening has resolved. Gallbladder wall thickness is now 2 mm which is normal. Negative Murphy sign. No pericholecystic fluid collection.  Common bile duct: Diameter: 14 mm.  Liver: Liver echodense consistent with fatty infiltration. Focal fatty sparing again noted adjacent to the gallbladder.  IVC: No abnormality visualized.  Pancreas: Visualized portion unremarkable.  Spleen: Size and appearance within normal limits.  Right Kidney: Length: 10.0 cm. Echogenicity within normal limits. No mass or hydronephrosis visualized.  Left Kidney: Length: 10.2 cm. Echogenicity within normal limits. No mass or hydronephrosis visualized.  Abdominal aorta: No aneurysm visualized.  Other findings: None.  IMPRESSION: 1. Progressive distention of the common bile duct. Common bile duct now measures up to 14 mm. MRCP should be considered for further evaluation. 2. Previously identified 5 mm non mobile density in the gallbladder is now mobile and most likely represent focal area of tumefactive sludge and/or gallstones. Gallbladder wall thickening has resolved. Negative Murphy sign. No pericholecystic fluid collection. 3. Fatty infiltration of the liver with focal fatty sparing adjacent to the gallbladder.   Electronically Signed  ByMarcello Moores  Register   On: 03/29/2014 11:40   Mr 3d Recon At Scanner  03/31/2014   CLINICAL DATA:  58 year old female with history of abdominal pain and weight loss, found to have an enlarged common bile duct by ultrasound.  EXAM: MRI ABDOMEN WITHOUT AND WITH CONTRAST (INCLUDING MRCP)  TECHNIQUE: Multiplanar multisequence MR imaging of the abdomen was performed both before and after the  administration of intravenous contrast. Heavily T2-weighted images of the biliary and pancreatic ducts were obtained, and three-dimensional MRCP images were rendered by post processing.  CONTRAST:  73m MULTIHANCE GADOBENATE DIMEGLUMINE 529 MG/ML IV SOLN  COMPARISON:  Abdominal ultrasound 03/29/2014.  FINDINGS: Study is slightly limited by a large amount of patient respiratory motion.  Relatively diffuse loss of signal intensity throughout the hepatic parenchyma on out of phase dual echo images, compatible with hepatic steatosis. Scattered areas of focal fatty sparing are noted, most evident adjacent to the gallbladder fossa. In segment 2 of the liver there is a 1 cm lesion (image 22 of series 13) which is low signal intensity on T1 weighted images, high signal intensity on T2 weighted images, and does not enhance, favored to be a biliary hamartoma or small cyst. No other cystic or solid hepatic lesions are identified. Mild intrahepatic biliary ductal dilatation is noted. Gallbladder appears moderately distended, but is otherwise normal in appearance. There is a 5 mm filling defect in the gallbladder fundus, compatible with a small stone or biliary sludge ball. The common hepatic duct and common bile duct are both dilated, measuring up to 11 mm and 12 mm in diameter respectively. MRCP images demonstrate no definite filling defect within the common hepatic or common bile duct. Thin slab T2 weighted images demonstrate potential small filling defects in the distal aspect of the common bile duct on images 33 and 34 of series 13, however, it is uncertain whether or not these are real or simply flow related artifacts. Respiratory motion severely limits assessment of the distal aspect of the common bile duct, and the head of the pancreas. With these limitations in mind no discrete pancreatic head mass is confidently identified on today's study. Additionally, there is no pancreatic ductal dilatation. There is a poorly  depicted region of low T1 signal intensity, high T2 signal intensity and no enhancement immediately posterior and lateral to the distal common bile duct near the ampulla, which is favored to represent a duodenal diverticulum from the second or third portion of the duodenum. There is a similar finding (although this is predominantly low T2 signal intensity, likely because of internal gas) anteriorly to the distal common bile duct as well, which may be part of the same diverticulum, or a second more anteriorly located diverticulum, best appreciated on image 62 of series 1503.  Tiny sub cm lesions in the spleen are low T1 signal intensity, high T2 signal intensity, and do not enhance, likely small cysts. The appearance of the adrenal glands is normal bilaterally. There are subcentimeter lesions in the upper poles of the kidneys bilaterally which are low T1 signal intensity, high T2 signal intensity, and do not enhance, compatible with tiny simple cysts. Visualized portions of the lower thorax are unremarkable.  IMPRESSION: 1. Limited study secondary extensive respiratory motion. The findings are unusual in that there is dilatation of the common bile duct and common hepatic ducts, but there is no intrahepatic biliary ductal dilatation. Although there are potential small filling defects in the distal common bile duct shortly before the ampulla noted  on axial T2 weighted images, these may simply be flow related artifact, and these findings are not confirmed on additional imaging planes or additional pulse sequences. The possibility of distal ductal stones is not excluded, but is not strongly favored. Other differential considerations include a distal ductal stricture (however, any obstructing lesion would be expected to be associated with some degree of intrahepatic biliary ductal dilatation, which is not present), or potentially a type 1 choledochal cyst. Correlation with ERCP is recommended if there are clinical signs  and symptoms of biliary tract obstruction. 2. Collections of gas and fluid adjacent to the distal common bile duct described above are favored to represent one or more duodenal diverticulae from the second and/or third portion of the duodenum. 3. Hepatic steatosis with areas of focal fatty sparing. 4. 5 mm filling defect in the gallbladder fundus compatible with a small gallstone or biliary sludge ball. No current findings to suggest an acute cholecystitis at this time. 5. Additional incidental findings, as above.   Electronically Signed   By: Vinnie Langton M.D.   On: 03/31/2014 08:42   Mr Lambert Mody Cm/mrcp  03/31/2014   CLINICAL DATA:  58 year old female with history of abdominal pain and weight loss, found to have an enlarged common bile duct by ultrasound.  EXAM: MRI ABDOMEN WITHOUT AND WITH CONTRAST (INCLUDING MRCP)  TECHNIQUE: Multiplanar multisequence MR imaging of the abdomen was performed both before and after the administration of intravenous contrast. Heavily T2-weighted images of the biliary and pancreatic ducts were obtained, and three-dimensional MRCP images were rendered by post processing.  CONTRAST:  45m MULTIHANCE GADOBENATE DIMEGLUMINE 529 MG/ML IV SOLN  COMPARISON:  Abdominal ultrasound 03/29/2014.  FINDINGS: Study is slightly limited by a large amount of patient respiratory motion.  Relatively diffuse loss of signal intensity throughout the hepatic parenchyma on out of phase dual echo images, compatible with hepatic steatosis. Scattered areas of focal fatty sparing are noted, most evident adjacent to the gallbladder fossa. In segment 2 of the liver there is a 1 cm lesion (image 22 of series 13) which is low signal intensity on T1 weighted images, high signal intensity on T2 weighted images, and does not enhance, favored to be a biliary hamartoma or small cyst. No other cystic or solid hepatic lesions are identified. Mild intrahepatic biliary ductal dilatation is noted. Gallbladder appears  moderately distended, but is otherwise normal in appearance. There is a 5 mm filling defect in the gallbladder fundus, compatible with a small stone or biliary sludge ball. The common hepatic duct and common bile duct are both dilated, measuring up to 11 mm and 12 mm in diameter respectively. MRCP images demonstrate no definite filling defect within the common hepatic or common bile duct. Thin slab T2 weighted images demonstrate potential small filling defects in the distal aspect of the common bile duct on images 33 and 34 of series 13, however, it is uncertain whether or not these are real or simply flow related artifacts. Respiratory motion severely limits assessment of the distal aspect of the common bile duct, and the head of the pancreas. With these limitations in mind no discrete pancreatic head mass is confidently identified on today's study. Additionally, there is no pancreatic ductal dilatation. There is a poorly depicted region of low T1 signal intensity, high T2 signal intensity and no enhancement immediately posterior and lateral to the distal common bile duct near the ampulla, which is favored to represent a duodenal diverticulum from the second or third portion of the  duodenum. There is a similar finding (although this is predominantly low T2 signal intensity, likely because of internal gas) anteriorly to the distal common bile duct as well, which may be part of the same diverticulum, or a second more anteriorly located diverticulum, best appreciated on image 62 of series 1503.  Tiny sub cm lesions in the spleen are low T1 signal intensity, high T2 signal intensity, and do not enhance, likely small cysts. The appearance of the adrenal glands is normal bilaterally. There are subcentimeter lesions in the upper poles of the kidneys bilaterally which are low T1 signal intensity, high T2 signal intensity, and do not enhance, compatible with tiny simple cysts. Visualized portions of the lower thorax are  unremarkable.  IMPRESSION: 1. Limited study secondary extensive respiratory motion. The findings are unusual in that there is dilatation of the common bile duct and common hepatic ducts, but there is no intrahepatic biliary ductal dilatation. Although there are potential small filling defects in the distal common bile duct shortly before the ampulla noted on axial T2 weighted images, these may simply be flow related artifact, and these findings are not confirmed on additional imaging planes or additional pulse sequences. The possibility of distal ductal stones is not excluded, but is not strongly favored. Other differential considerations include a distal ductal stricture (however, any obstructing lesion would be expected to be associated with some degree of intrahepatic biliary ductal dilatation, which is not present), or potentially a type 1 choledochal cyst. Correlation with ERCP is recommended if there are clinical signs and symptoms of biliary tract obstruction. 2. Collections of gas and fluid adjacent to the distal common bile duct described above are favored to represent one or more duodenal diverticulae from the second and/or third portion of the duodenum. 3. Hepatic steatosis with areas of focal fatty sparing. 4. 5 mm filling defect in the gallbladder fundus compatible with a small gallstone or biliary sludge ball. No current findings to suggest an acute cholecystitis at this time. 5. Additional incidental findings, as above.   Electronically Signed   By: Vinnie Langton M.D.   On: 03/31/2014 08:42   Mr Outside Films Chest  03/29/2014   This examination belongs to an outside facility and is stored  here for comparison purposes only.  Contact the originating outside  institution for any associated report or interpretation.   Review of Systems  Constitutional: Positive for fever and chills. Negative for weight loss, malaise/fatigue and diaphoresis.  HENT: Positive for hearing loss.   Eyes:  Negative.   Respiratory: Negative.   Cardiovascular: Negative.   Gastrointestinal: Positive for nausea, abdominal pain (mid and ruq areas) and constipation (chronic issue). Negative for heartburn, vomiting, diarrhea, blood in stool and melena.  Genitourinary: Negative.   Musculoskeletal: Positive for back pain and joint pain.       Chronic pain mostly in shoulders and arms, but affects her all over.  Neurological: Negative for weakness.  Endo/Heme/Allergies: Negative.   Psychiatric/Behavioral: Positive for depression. The patient is nervous/anxious.    Blood pressure 116/65, pulse 69, temperature 97.8 F (36.6 C), temperature source Oral, resp. rate 18, height _0  (1.626 m), weight 57.3 kg (126 lb 5.2 oz), SpO2 98 %. Physical Exam  Constitutional: She is oriented to person, place, and time. She appears well-developed. No distress.  Thin WF, NAD, want some real food.  HENT:  Head: Normocephalic and atraumatic.  Nose: Nose normal.  Eyes: Conjunctivae and EOM are normal. Pupils are equal, round, and reactive to light.  Right eye exhibits no discharge. Left eye exhibits no discharge.  Neck: Normal range of motion. Neck supple. No JVD present. No tracheal deviation present. No thyromegaly present.  Cardiovascular: Normal rate, regular rhythm, normal heart sounds and intact distal pulses.   No murmur heard. Respiratory: Effort normal and breath sounds normal. No respiratory distress. She has no wheezes. She has no rales. She exhibits no tenderness.  GI: Soft. Bowel sounds are normal. She exhibits no distension and no mass. There is tenderness (mid abdomen and RUQ). There is no rebound and no guarding.  Lower abdominal scar, midline  Musculoskeletal: She exhibits no edema.  Lymphadenopathy:    She has no cervical adenopathy.  Neurological: She is alert and oriented to person, place, and time. No cranial nerve deficit.  Skin: Skin is warm and dry. No rash noted. She is not diaphoretic. No  erythema. No pallor.  Psychiatric: She has a normal mood and affect. Her behavior is normal. Judgment and thought content normal.    Assessment/Plan: 1.Cholelithiasis, GB wall thickening, CBD dilatation with post prandial pain 2.  Migraines 3-4 per month 3.  Chronic pain/Fibromyalgia followed by Dr. Alger Simons. 4. Depression/anxiety disorder, since 01/1981 5.  IBS with chronic constipation 6.  Hx of tobacco use 7.  Hx of MVP 8.  Cervical cancer  01/1981 9.  Hypothyroid on supplement  Plan:  She has had liquids today and wants some real food.  I will give her a low fat diet, recheck labs in AM and discuss surgery with Dr. Zella Richer for tomorrow.       Bion Todorov 03/31/2014, 10:25 AM

## 2014-03-31 NOTE — Progress Notes (Signed)
[facility] PROGRESS NOTE  Jenna Molina SEG:[id number] DOB: June 13, 1955 DOA: 03/29/2014 PCP: Tawanna Solo, MD  58 year old female with history of fibromyalgia, chronic fatigue, anxiety, depression, migraine headaches, presents with complaints of abdominal pain, fever at home, ultrasound done showing sludge versus gallstones in gallbladder, with CBD or 14 mm, medicine recently at [facility], had EGD/colonoscopy, significant for gastritis, and nonmalignant polyps, patient presents patient report she recently had MRCP to evaluate upper abdominal pain, where she had dilated CPD, she brought to the discal with her, which were evaluated by GI service, but they were poor quality, as well the MRI part was not done, so patient is currently admitted where she is getting MRCP done for further evaluation.     Assessment/Plan: #1 cholelithiasis/gallbladder wall thickening/, bile duct dilatation with postprandial pain Patient still with some postprandial abdominal pain the right upper quadrant abdominal pain. MRCP confirming biliary duct dilatation with without obvious bile duct stones. Patient has been seen by general surgery and patient to undergo a lap cholecystectomy with IOC tomorrow. GI general surgery following and appreciate their input recommendations.  #2 history of migraine headaches Resume home regimen of Imitrex as needed.  #3 hypothyroidism Continue home dose Synthroid.  #4 depression Continue Celexa.  #5 fibromyalgia Stable. Follow.  Code Status: Full Family Communication: Updated patient no family at bedside. Disposition Plan: Remain inpatient.   Consultants:  Gastroenterology: Dr. Ardis Hughs 03/29/2014  Gen. surgery: Dr. Elinor Parkinson 03/31/2014  Procedures:  MRCP 03/30/2014  Abdominal ultrasound 03/29/2014  Antibiotics:  None  HPI/Subjective: Patient states some right upper quadrant pain with oral intake. Patient concerned that she might be developing a  migraine headache.  Objective: Filed Vitals:   03/31/14 1344  BP: 98/59  Pulse: 74  Temp: 98.3 F (36.8 C)  Resp: 20    Intake/Output Summary (Last 24 hours) at 03/31/14 1747 Last data filed at 03/31/14 0900  Gross per 24 hour  Intake   1320 ml  Output      0 ml  Net   1320 ml   Filed Weights   03/29/14 1700  Weight: 57.3 kg (126 lb 5.2 oz)    Exam:   General:  NAD  Cardiovascular: RRR  Respiratory: CTAB  Abdomen: Soft, tender to palpation the right upper quadrant, positive bowel sounds, nondistended.  Musculoskeletal: No clubbing cyanosis or edema.  Data Reviewed: Basic Metabolic Panel:  Recent Labs Lab 03/29/14 1021 03/30/14 0128 03/31/14 0438  NA 134* 143 143  K 4.2 4.2 3.9  CL 98 108 107  CO2 23 22 26   GLUCOSE 118* 92 102*  BUN 13 9 6   CREATININE 0.88 0.73 0.80  CALCIUM 9.5 8.9 9.2   Liver Function Tests:  Recent Labs Lab 03/29/14 1021 03/30/14 0128 03/31/14 0438  AST 40* 36 25  ALT 116* 97* 75*  ALKPHOS 217* 189* 176*  BILITOT 0.9 0.8 0.8  PROT 7.3 6.5 6.5  ALBUMIN 3.7 3.3* 3.5    Recent Labs Lab 03/29/14 1021 03/30/14 0128  LIPASE 100* 97*  AMYLASE  --  115*   No results for input(s): AMMONIA in the last 168 hours. CBC:  Recent Labs Lab 03/29/14 1021 03/30/14 0128 03/31/14 0438  WBC 6.8 6.5 6.4  NEUTROABS 4.0  --   --   HGB 11.7* 11.6* 11.0*  HCT 35.7* 36.1 34.3*  MCV 85.6 87.4 87.5  PLT 298 279 314   Cardiac Enzymes:  Recent Labs Lab 03/29/14 1316 03/29/14 1911 03/30/14 0128  TROPONINI <0.30 <0.30 <0.30  BNP (last 3 results) No results for input(s): PROBNP in the last 8760 hours. CBG: No results for input(s): GLUCAP in the last 168 hours.  Recent Results (from the past 240 hour(s))  Urine culture     Status: None   Collection Time: 03/29/14 11:33 AM  Result Value Ref Range Status   Specimen Description URINE, RANDOM  Final   Special Requests NONE  Final   Culture  Setup Time   Final    03/29/2014  15:43 Performed at [facility]   Final    35,000 COLONIES/ML Performed at [facility]    Culture   Final    LACTOBACILLUS SPECIES Note: Standardized susceptibility testing for this organism is not available. Performed at [facility]    Report Status 03/30/2014 FINAL  Final     Studies: Mr 3d Recon At [facility]  April 05, 2014   CLINICAL DATA:  58 year old female with history of abdominal pain and weight loss, found to have an enlarged common bile duct by ultrasound.  EXAM: MRI ABDOMEN WITHOUT AND WITH CONTRAST (INCLUDING MRCP)  TECHNIQUE: Multiplanar multisequence MR imaging of the abdomen was performed both before and after the administration of intravenous contrast. Heavily T2-weighted images of the biliary and pancreatic ducts were obtained, and three-dimensional MRCP images were rendered by post processing.  CONTRAST:  58mL MULTIHANCE GADOBENATE DIMEGLUMINE 529 MG/ML IV SOLN  COMPARISON:  Abdominal ultrasound 03/29/2014.  FINDINGS: Study is slightly limited by a large amount of patient respiratory motion.  Relatively diffuse loss of signal intensity throughout the hepatic parenchyma on out of phase dual echo images, compatible with hepatic steatosis. Scattered areas of focal fatty sparing are noted, most evident adjacent to the gallbladder fossa. In segment 2 of the liver there is a 1 cm lesion (image 22 of series 13) which is low signal intensity on T1 weighted images, high signal intensity on T2 weighted images, and does not enhance, favored to be a biliary hamartoma or small cyst. No other cystic or solid hepatic lesions are identified. Mild intrahepatic biliary ductal dilatation is noted. Gallbladder appears moderately distended, but is otherwise normal in appearance. There is a 5 mm filling defect in the gallbladder fundus, compatible with a small stone or biliary sludge ball. The common hepatic duct and common bile duct are both dilated, measuring up  to 11 mm and 12 mm in diameter respectively. MRCP images demonstrate no definite filling defect within the common hepatic or common bile duct. Thin slab T2 weighted images demonstrate potential small filling defects in the distal aspect of the common bile duct on images 33 and 34 of series 13, however, it is uncertain whether or not these are real or simply flow related artifacts. Respiratory motion severely limits assessment of the distal aspect of the common bile duct, and the head of the pancreas. With these limitations in mind no discrete pancreatic head mass is confidently identified on today's study. Additionally, there is no pancreatic ductal dilatation. There is a poorly depicted region of low T1 signal intensity, high T2 signal intensity and no enhancement immediately posterior and lateral to the distal common bile duct near the ampulla, which is favored to represent a duodenal diverticulum from the second or third portion of the duodenum. There is a similar finding (although this is predominantly low T2 signal intensity, likely because of internal gas) anteriorly to the distal common bile duct as well, which may be part of the same diverticulum, or a second  more anteriorly located diverticulum, best appreciated on image 62 of series 1503.  Tiny sub cm lesions in the spleen are low T1 signal intensity, high T2 signal intensity, and do not enhance, likely small cysts. The appearance of the adrenal glands is normal bilaterally. There are subcentimeter lesions in the upper poles of the kidneys bilaterally which are low T1 signal intensity, high T2 signal intensity, and do not enhance, compatible with tiny simple cysts. Visualized portions of the lower thorax are unremarkable.  IMPRESSION: 1. Limited study secondary extensive respiratory motion. The findings are unusual in that there is dilatation of the common bile duct and common hepatic ducts, but there is no intrahepatic biliary ductal dilatation. Although  there are potential small filling defects in the distal common bile duct shortly before the ampulla noted on axial T2 weighted images, these may simply be flow related artifact, and these findings are not confirmed on additional imaging planes or additional pulse sequences. The possibility of distal ductal stones is not excluded, but is not strongly favored. Other differential considerations include a distal ductal stricture (however, any obstructing lesion would be expected to be associated with some degree of intrahepatic biliary ductal dilatation, which is not present), or potentially a type 1 choledochal cyst. Correlation with ERCP is recommended if there are clinical signs and symptoms of biliary tract obstruction. 2. Collections of gas and fluid adjacent to the distal common bile duct described above are favored to represent one or more duodenal diverticulae from the second and/or third portion of the duodenum. 3. Hepatic steatosis with areas of focal fatty sparing. 4. 5 mm filling defect in the gallbladder fundus compatible with a small gallstone or biliary sludge ball. No current findings to suggest an acute cholecystitis at this time. 5. Additional incidental findings, as above.   Electronically Signed   By: Vinnie Langton M.D.   On: 03/31/2014 08:42   Mr Lambert Mody Cm/mrcp  03/31/2014   CLINICAL DATA:  58 year old female with history of abdominal pain and weight loss, found to have an enlarged common bile duct by ultrasound.  EXAM: MRI ABDOMEN WITHOUT AND WITH CONTRAST (INCLUDING MRCP)  TECHNIQUE: Multiplanar multisequence MR imaging of the abdomen was performed both before and after the administration of intravenous contrast. Heavily T2-weighted images of the biliary and pancreatic ducts were obtained, and three-dimensional MRCP images were rendered by post processing.  CONTRAST:  10mL MULTIHANCE GADOBENATE DIMEGLUMINE 529 MG/ML IV SOLN  COMPARISON:  Abdominal ultrasound 03/29/2014.  FINDINGS: Study is  slightly limited by a large amount of patient respiratory motion.  Relatively diffuse loss of signal intensity throughout the hepatic parenchyma on out of phase dual echo images, compatible with hepatic steatosis. Scattered areas of focal fatty sparing are noted, most evident adjacent to the gallbladder fossa. In segment 2 of the liver there is a 1 cm lesion (image 22 of series 13) which is low signal intensity on T1 weighted images, high signal intensity on T2 weighted images, and does not enhance, favored to be a biliary hamartoma or small cyst. No other cystic or solid hepatic lesions are identified. Mild intrahepatic biliary ductal dilatation is noted. Gallbladder appears moderately distended, but is otherwise normal in appearance. There is a 5 mm filling defect in the gallbladder fundus, compatible with a small stone or biliary sludge ball. The common hepatic duct and common bile duct are both dilated, measuring up to 11 mm and 12 mm in diameter respectively. MRCP images demonstrate no definite filling defect within the common  hepatic or common bile duct. Thin slab T2 weighted images demonstrate potential small filling defects in the distal aspect of the common bile duct on images 33 and 34 of series 13, however, it is uncertain whether or not these are real or simply flow related artifacts. Respiratory motion severely limits assessment of the distal aspect of the common bile duct, and the head of the pancreas. With these limitations in mind no discrete pancreatic head mass is confidently identified on today's study. Additionally, there is no pancreatic ductal dilatation. There is a poorly depicted region of low T1 signal intensity, high T2 signal intensity and no enhancement immediately posterior and lateral to the distal common bile duct near the ampulla, which is favored to represent a duodenal diverticulum from the second or third portion of the duodenum. There is a similar finding (although this is  predominantly low T2 signal intensity, likely because of internal gas) anteriorly to the distal common bile duct as well, which may be part of the same diverticulum, or a second more anteriorly located diverticulum, best appreciated on image 62 of series 1503.  Tiny sub cm lesions in the spleen are low T1 signal intensity, high T2 signal intensity, and do not enhance, likely small cysts. The appearance of the adrenal glands is normal bilaterally. There are subcentimeter lesions in the upper poles of the kidneys bilaterally which are low T1 signal intensity, high T2 signal intensity, and do not enhance, compatible with tiny simple cysts. Visualized portions of the lower thorax are unremarkable.  IMPRESSION: 1. Limited study secondary extensive respiratory motion. The findings are unusual in that there is dilatation of the common bile duct and common hepatic ducts, but there is no intrahepatic biliary ductal dilatation. Although there are potential small filling defects in the distal common bile duct shortly before the ampulla noted on axial T2 weighted images, these may simply be flow related artifact, and these findings are not confirmed on additional imaging planes or additional pulse sequences. The possibility of distal ductal stones is not excluded, but is not strongly favored. Other differential considerations include a distal ductal stricture (however, any obstructing lesion would be expected to be associated with some degree of intrahepatic biliary ductal dilatation, which is not present), or potentially a type 1 choledochal cyst. Correlation with ERCP is recommended if there are clinical signs and symptoms of biliary tract obstruction. 2. Collections of gas and fluid adjacent to the distal common bile duct described above are favored to represent one or more duodenal diverticulae from the second and/or third portion of the duodenum. 3. Hepatic steatosis with areas of focal fatty sparing. 4. 5 mm filling  defect in the gallbladder fundus compatible with a small gallstone or biliary sludge ball. No current findings to suggest an acute cholecystitis at this time. 5. Additional incidental findings, as above.   Electronically Signed   By: Vinnie Langton M.D.   On: 03/31/2014 08:42   Mr Outside Films Chest  03/29/2014   This examination belongs to an outside facility and is stored  here for comparison purposes only.  Contact the originating outside  institution for any associated report or interpretation.   Scheduled Meds: . citalopram  40 mg Oral QHS  . cyclobenzaprine  20 mg Oral QHS  . docusate sodium  50 mg Oral Daily  . heparin subcutaneous  5,000 Units Subcutaneous Q8H  . levothyroxine  100 mcg Oral QAC breakfast  . pantoprazole  40 mg Oral Daily  . sodium chloride  3  mL Intravenous Q12H  . topiramate  200 mg Oral q morning - 10a   Continuous Infusions: . sodium chloride 100 mL/hr at 03/31/14 1154    Active Problems:   Abdominal pain, acute   Abdominal pain    Time spent: 60 mins    [facility] MD [facility] Pager [phone]. If 7PM-7AM, please contact night-coverage at [email or web address], password [facility] 03/31/2014, 5:47 PM  LOS: 2 days

## 2014-03-31 NOTE — Progress Notes (Signed)
Progress Note   Subjective  Feels okay. Had some transient RUQ pain yesterday but nothing like what she had when admitted to hospital.     Objective   Vital signs in last 24 hours: Temp:  [97.8 F (36.6 C)-98.3 F (36.8 C)] 97.8 F (36.6 C) (12/16 0441) Pulse Rate:  [64-78] 69 (12/16 0441) Resp:  [18] 18 (12/16 0441) BP: (98-116)/(65-67) 116/65 mmHg (12/16 0441) SpO2:  [97 %-99 %] 98 % (12/16 0441) Weight:  [126 lb (57.153 kg)] 126 lb (57.153 kg) (12/15 1825) Last BM Date: 03/26/14 General:    white female in NAD Heart:  Regular rate and rhythm Abdomen:  Soft, mild RUQ tenderness, nondistended.  Extremities:  Without edema. Neurologic:  Alert and oriented,  grossly normal neurologically. Psych:  Cooperative. Normal mood and affect.     Lab Results:  Recent Labs  03/29/14 1021 03/30/14 0128 03/31/14 0438  WBC 6.8 6.5 6.4  HGB 11.7* 11.6* 11.0*  HCT 35.7* 36.1 34.3*  PLT 298 279 314   BMET  Recent Labs  03/29/14 1021 03/30/14 0128 03/31/14 0438  NA 134* 143 143  K 4.2 4.2 3.9  CL 98 108 107  CO2 23 22 26   GLUCOSE 118* 92 102*  BUN 13 9 6   CREATININE 0.88 0.73 0.80  CALCIUM 9.5 8.9 9.2   LFT  Recent Labs  03/31/14 0438  PROT 6.5  ALBUMIN 3.5  AST 25  ALT 75*  ALKPHOS 176*  BILITOT 0.8  BILIDIR 0.3  IBILI 0.5    Studies/Results:  Mr 3d Recon At Scanner  03/31/2014   CLINICAL DATA:  58 year old female with history of abdominal pain and weight loss, found to have an enlarged common bile duct by ultrasound.  EXAM: MRI ABDOMEN WITHOUT AND WITH CONTRAST (INCLUDING MRCP)  TECHNIQUE: Multiplanar multisequence MR imaging of the abdomen was performed both before and after the administration of intravenous contrast. Heavily T2-weighted images of the biliary and pancreatic ducts were obtained, and three-dimensional MRCP images were rendered by post processing.  CONTRAST:  18mL MULTIHANCE GADOBENATE DIMEGLUMINE 529 MG/ML IV SOLN  COMPARISON:  Abdominal  ultrasound 03/29/2014.  FINDINGS: Study is slightly limited by a large amount of patient respiratory motion.  Relatively diffuse loss of signal intensity throughout the hepatic parenchyma on out of phase dual echo images, compatible with hepatic steatosis. Scattered areas of focal fatty sparing are noted, most evident adjacent to the gallbladder fossa. In segment 2 of the liver there is a 1 cm lesion (image 22 of series 13) which is low signal intensity on T1 weighted images, high signal intensity on T2 weighted images, and does not enhance, favored to be a biliary hamartoma or small cyst. No other cystic or solid hepatic lesions are identified. Mild intrahepatic biliary ductal dilatation is noted. Gallbladder appears moderately distended, but is otherwise normal in appearance. There is a 5 mm filling defect in the gallbladder fundus, compatible with a small stone or biliary sludge ball. The common hepatic duct and common bile duct are both dilated, measuring up to 11 mm and 12 mm in diameter respectively. MRCP images demonstrate no definite filling defect within the common hepatic or common bile duct. Thin slab T2 weighted images demonstrate potential small filling defects in the distal aspect of the common bile duct on images 33 and 34 of series 13, however, it is uncertain whether or not these are real or simply flow related artifacts. Respiratory motion severely limits assessment of the distal aspect of the  common bile duct, and the head of the pancreas. With these limitations in mind no discrete pancreatic head mass is confidently identified on today's study. Additionally, there is no pancreatic ductal dilatation. There is a poorly depicted region of low T1 signal intensity, high T2 signal intensity and no enhancement immediately posterior and lateral to the distal common bile duct near the ampulla, which is favored to represent a duodenal diverticulum from the second or third portion of the duodenum. There is  a similar finding (although this is predominantly low T2 signal intensity, likely because of internal gas) anteriorly to the distal common bile duct as well, which may be part of the same diverticulum, or a second more anteriorly located diverticulum, best appreciated on image 62 of series 1503.  Tiny sub cm lesions in the spleen are low T1 signal intensity, high T2 signal intensity, and do not enhance, likely small cysts. The appearance of the adrenal glands is normal bilaterally. There are subcentimeter lesions in the upper poles of the kidneys bilaterally which are low T1 signal intensity, high T2 signal intensity, and do not enhance, compatible with tiny simple cysts. Visualized portions of the lower thorax are unremarkable.  IMPRESSION: 1. Limited study secondary extensive respiratory motion. The findings are unusual in that there is dilatation of the common bile duct and common hepatic ducts, but there is no intrahepatic biliary ductal dilatation. Although there are potential small filling defects in the distal common bile duct shortly before the ampulla noted on axial T2 weighted images, these may simply be flow related artifact, and these findings are not confirmed on additional imaging planes or additional pulse sequences. The possibility of distal ductal stones is not excluded, but is not strongly favored. Other differential considerations include a distal ductal stricture (however, any obstructing lesion would be expected to be associated with some degree of intrahepatic biliary ductal dilatation, which is not present), or potentially a type 1 choledochal cyst. Correlation with ERCP is recommended if there are clinical signs and symptoms of biliary tract obstruction. 2. Collections of gas and fluid adjacent to the distal common bile duct described above are favored to represent one or more duodenal diverticulae from the second and/or third portion of the duodenum. 3. Hepatic steatosis with areas of focal  fatty sparing. 4. 5 mm filling defect in the gallbladder fundus compatible with a small gallstone or biliary sludge ball. No current findings to suggest an acute cholecystitis at this time. 5. Additional incidental findings, as above.   Electronically Signed   By: Vinnie Langton M.D.   On: 03/31/2014 08:42   Mr Lambert Mody Cm/mrcp  03/31/2014   CLINICAL DATA:  58 year old female with history of abdominal pain and weight loss, found to have an enlarged common bile duct by ultrasound.  EXAM: MRI ABDOMEN WITHOUT AND WITH CONTRAST (INCLUDING MRCP)  TECHNIQUE: Multiplanar multisequence MR imaging of the abdomen was performed both before and after the administration of intravenous contrast. Heavily T2-weighted images of the biliary and pancreatic ducts were obtained, and three-dimensional MRCP images were rendered by post processing.  CONTRAST:  75mL MULTIHANCE GADOBENATE DIMEGLUMINE 529 MG/ML IV SOLN  COMPARISON:  Abdominal ultrasound 03/29/2014.  FINDINGS: Study is slightly limited by a large amount of patient respiratory motion.  Relatively diffuse loss of signal intensity throughout the hepatic parenchyma on out of phase dual echo images, compatible with hepatic steatosis. Scattered areas of focal fatty sparing are noted, most evident adjacent to the gallbladder fossa. In segment 2 of the liver  there is a 1 cm lesion (image 22 of series 13) which is low signal intensity on T1 weighted images, high signal intensity on T2 weighted images, and does not enhance, favored to be a biliary hamartoma or small cyst. No other cystic or solid hepatic lesions are identified. Mild intrahepatic biliary ductal dilatation is noted. Gallbladder appears moderately distended, but is otherwise normal in appearance. There is a 5 mm filling defect in the gallbladder fundus, compatible with a small stone or biliary sludge ball. The common hepatic duct and common bile duct are both dilated, measuring up to 11 mm and 12 mm in diameter  respectively. MRCP images demonstrate no definite filling defect within the common hepatic or common bile duct. Thin slab T2 weighted images demonstrate potential small filling defects in the distal aspect of the common bile duct on images 33 and 34 of series 13, however, it is uncertain whether or not these are real or simply flow related artifacts. Respiratory motion severely limits assessment of the distal aspect of the common bile duct, and the head of the pancreas. With these limitations in mind no discrete pancreatic head mass is confidently identified on today's study. Additionally, there is no pancreatic ductal dilatation. There is a poorly depicted region of low T1 signal intensity, high T2 signal intensity and no enhancement immediately posterior and lateral to the distal common bile duct near the ampulla, which is favored to represent a duodenal diverticulum from the second or third portion of the duodenum. There is a similar finding (although this is predominantly low T2 signal intensity, likely because of internal gas) anteriorly to the distal common bile duct as well, which may be part of the same diverticulum, or a second more anteriorly located diverticulum, best appreciated on image 62 of series 1503.  Tiny sub cm lesions in the spleen are low T1 signal intensity, high T2 signal intensity, and do not enhance, likely small cysts. The appearance of the adrenal glands is normal bilaterally. There are subcentimeter lesions in the upper poles of the kidneys bilaterally which are low T1 signal intensity, high T2 signal intensity, and do not enhance, compatible with tiny simple cysts. Visualized portions of the lower thorax are unremarkable.  IMPRESSION: 1. Limited study secondary extensive respiratory motion. The findings are unusual in that there is dilatation of the common bile duct and common hepatic ducts, but there is no intrahepatic biliary ductal dilatation. Although there are potential small  filling defects in the distal common bile duct shortly before the ampulla noted on axial T2 weighted images, these may simply be flow related artifact, and these findings are not confirmed on additional imaging planes or additional pulse sequences. The possibility of distal ductal stones is not excluded, but is not strongly favored. Other differential considerations include a distal ductal stricture (however, any obstructing lesion would be expected to be associated with some degree of intrahepatic biliary ductal dilatation, which is not present), or potentially a type 1 choledochal cyst. Correlation with ERCP is recommended if there are clinical signs and symptoms of biliary tract obstruction. 2. Collections of gas and fluid adjacent to the distal common bile duct described above are favored to represent one or more duodenal diverticulae from the second and/or third portion of the duodenum. 3. Hepatic steatosis with areas of focal fatty sparing. 4. 5 mm filling defect in the gallbladder fundus compatible with a small gallstone or biliary sludge ball. No current findings to suggest an acute cholecystitis at this time. 5. Additional incidental  findings, as above.   Electronically Signed   By: Vinnie Langton M.D.   On: 03/31/2014 08:42   Mr Outside Films Chest  03/29/2014   This examination belongs to an outside facility and is stored  here for comparison purposes only.  Contact the originating outside  institution for any associated report or interpretation.      Assessment / Plan:    58 year old female with intermittent postprandial abdominal pain, abnormal LFTs, CBD dilation. LFTs improving. MRI with MRCP limited by motion artifact but reconfirms biliary duct dilation without obvious bile duct stones. Recommend surgical consult for consideration of cholecystectomy. Most likely needs surgical consult soon for consideration of cholecystectomy with IOC. I will place the consult    LOS: 2 days    Tye Savoy  03/31/2014, 9:12 AM   ________________________________________________________________________  Velora Heckler GI MD note:  I personally examined the patient, reviewed the data and agree with the assessment and plan described above.  Dr. Zella Richer is planning lap chole tomorrow. Await IOC results   Owens Loffler, MD [facility] Pager [phone]

## 2014-04-01 ENCOUNTER — Inpatient Hospital Stay (HOSPITAL_COMMUNITY): Payer: Managed Care, Other (non HMO) | Admitting: Certified Registered Nurse Anesthetist

## 2014-04-01 ENCOUNTER — Encounter (HOSPITAL_COMMUNITY)
Admission: EM | Disposition: A | Discharge status: Home / Self Care | Payer: Self-pay | Source: Home / Self Care | Attending: Internal Medicine

## 2014-04-01 ENCOUNTER — Inpatient Hospital Stay (HOSPITAL_COMMUNITY): Payer: Managed Care, Other (non HMO)

## 2014-04-01 ENCOUNTER — Encounter (HOSPITAL_COMMUNITY): Payer: Self-pay | Admitting: Certified Registered Nurse Anesthetist

## 2014-04-01 DIAGNOSIS — K8050 Calculus of bile duct without cholangitis or cholecystitis without obstruction: Secondary | ICD-10-CM

## 2014-04-01 DIAGNOSIS — K838 Other specified diseases of biliary tract: Secondary | ICD-10-CM

## 2014-04-01 HISTORY — PX: CHOLECYSTECTOMY: SHX55

## 2014-04-01 LAB — PROTIME-INR
INR: 1.06 (ref 0.00–1.49)
Prothrombin Time: 13.9 seconds (ref 11.6–15.2)

## 2014-04-01 LAB — RETICULOCYTES
RBC.: 3.67 MIL/uL — AB (ref 3.87–5.11)
RETIC CT PCT: 1.4 % (ref 0.4–3.1)
Retic Count, Absolute: 51.4 10*3/uL (ref 19.0–186.0)

## 2014-04-01 LAB — COMPREHENSIVE METABOLIC PANEL
ALBUMIN: 3.1 g/dL — AB (ref 3.5–5.2)
ALK PHOS: 142 U/L — AB (ref 39–117)
ALT: 50 U/L — ABNORMAL HIGH (ref 0–35)
ANION GAP: 11 (ref 5–15)
AST: 18 U/L (ref 0–37)
BUN: 7 mg/dL (ref 6–23)
CO2: 24 mEq/L (ref 19–32)
CREATININE: 0.89 mg/dL (ref 0.50–1.10)
Calcium: 8.7 mg/dL (ref 8.4–10.5)
Chloride: 106 mEq/L (ref 96–112)
GFR calc non Af Amer: 69 mL/min — ABNORMAL LOW (ref >90)
GFR, EST AFRICAN AMERICAN: 80 mL/min — AB (ref >90)
GLUCOSE: 111 mg/dL — AB (ref 70–99)
POTASSIUM: 4 meq/L (ref 3.7–5.3)
Sodium: 141 mEq/L (ref 137–147)
Total Bilirubin: 0.5 mg/dL (ref 0.3–1.2)
Total Protein: 5.8 g/dL — ABNORMAL LOW (ref 6.0–8.3)

## 2014-04-01 LAB — CBC
HCT: 31.3 % — ABNORMAL LOW (ref 36.0–46.0)
Hemoglobin: 9.8 g/dL — ABNORMAL LOW (ref 12.0–15.0)
MCH: 27.7 pg (ref 26.0–34.0)
MCHC: 31.3 g/dL (ref 30.0–36.0)
MCV: 88.4 fL (ref 78.0–100.0)
Platelets: 320 10*3/uL (ref 150–400)
RBC: 3.54 MIL/uL — AB (ref 3.87–5.11)
RDW: 14.4 % (ref 11.5–15.5)
WBC: 4.6 10*3/uL (ref 4.0–10.5)

## 2014-04-01 LAB — LIPASE, BLOOD: LIPASE: 58 U/L (ref 11–59)

## 2014-04-01 LAB — IRON AND TIBC
IRON: 66 ug/dL (ref 42–135)
SATURATION RATIOS: 20 % (ref 20–55)
TIBC: 337 ug/dL (ref 250–470)
UIBC: 271 ug/dL (ref 125–400)

## 2014-04-01 LAB — FOLATE: FOLATE: 9.5 ng/mL

## 2014-04-01 LAB — FERRITIN: FERRITIN: 81 ng/mL (ref 10–291)

## 2014-04-01 LAB — VITAMIN B12: Vitamin B-12: 1179 pg/mL — ABNORMAL HIGH (ref 211–911)

## 2014-04-01 SURGERY — LAPAROSCOPIC CHOLECYSTECTOMY WITH INTRAOPERATIVE CHOLANGIOGRAM
Anesthesia: General | Site: Abdomen

## 2014-04-01 MED ORDER — BUPIVACAINE HCL (PF) 0.5 % IJ SOLN
INTRAMUSCULAR | Status: AC
  Filled 2014-04-01: qty 30

## 2014-04-01 MED ORDER — SUCCINYLCHOLINE CHLORIDE 20 MG/ML IJ SOLN
INTRAMUSCULAR | Status: DC | PRN
  Administered 2014-04-01: 100 mg via INTRAVENOUS

## 2014-04-01 MED ORDER — LACTATED RINGERS IV SOLN
INTRAVENOUS | Status: DC
  Administered 2014-04-01: 13:00:00 via INTRAVENOUS
  Administered 2014-04-01: 1000 mL via INTRAVENOUS

## 2014-04-01 MED ORDER — FENTANYL CITRATE 0.05 MG/ML IJ SOLN
INTRAMUSCULAR | Status: DC | PRN
  Administered 2014-04-01 (×5): 50 ug via INTRAVENOUS

## 2014-04-01 MED ORDER — MORPHINE SULFATE 10 MG/ML IJ SOLN
2.0000 mg | INTRAMUSCULAR | Status: DC | PRN
  Administered 2014-04-01 (×2): 4 mg via INTRAVENOUS
  Administered 2014-04-01: 2 mg via INTRAVENOUS
  Administered 2014-04-01 – 2014-04-02 (×2): 4 mg via INTRAVENOUS
  Administered 2014-04-02: 2 mg via INTRAVENOUS
  Administered 2014-04-02 (×2): 4 mg via INTRAVENOUS
  Administered 2014-04-02: 2 mg via INTRAVENOUS
  Administered 2014-04-02: 4 mg via INTRAVENOUS
  Administered 2014-04-03: 6 mg via INTRAVENOUS
  Administered 2014-04-03 (×2): 4 mg via INTRAVENOUS
  Administered 2014-04-03: 6 mg via INTRAVENOUS
  Administered 2014-04-03: 4 mg via INTRAVENOUS
  Filled 2014-04-01 (×15): qty 1

## 2014-04-01 MED ORDER — DEXAMETHASONE SODIUM PHOSPHATE 10 MG/ML IJ SOLN
INTRAMUSCULAR | Status: DC | PRN
  Administered 2014-04-01: 10 mg via INTRAVENOUS

## 2014-04-01 MED ORDER — IOHEXOL 300 MG/ML  SOLN
INTRAMUSCULAR | Status: DC | PRN
  Administered 2014-04-01: 10 mL via INTRAVENOUS

## 2014-04-01 MED ORDER — METOCLOPRAMIDE HCL 5 MG/ML IJ SOLN
INTRAMUSCULAR | Status: DC | PRN
  Administered 2014-04-01: 10 mg via INTRAVENOUS

## 2014-04-01 MED ORDER — PROPOFOL 10 MG/ML IV BOLUS
INTRAVENOUS | Status: AC
  Filled 2014-04-01: qty 20

## 2014-04-01 MED ORDER — PROPOFOL 10 MG/ML IV BOLUS
INTRAVENOUS | Status: DC | PRN
  Administered 2014-04-01: 120 mg via INTRAVENOUS

## 2014-04-01 MED ORDER — MIDAZOLAM HCL 2 MG/2ML IJ SOLN
INTRAMUSCULAR | Status: AC
  Filled 2014-04-01: qty 2

## 2014-04-01 MED ORDER — ROCURONIUM BROMIDE 100 MG/10ML IV SOLN
INTRAVENOUS | Status: DC | PRN
  Administered 2014-04-01: 30 mg via INTRAVENOUS
  Administered 2014-04-01: 5 mg via INTRAVENOUS

## 2014-04-01 MED ORDER — GLYCOPYRROLATE 0.2 MG/ML IJ SOLN
INTRAMUSCULAR | Status: AC
  Filled 2014-04-01: qty 3

## 2014-04-01 MED ORDER — DEXAMETHASONE SODIUM PHOSPHATE 10 MG/ML IJ SOLN
INTRAMUSCULAR | Status: AC
  Filled 2014-04-01: qty 1

## 2014-04-01 MED ORDER — CEFAZOLIN SODIUM-DEXTROSE 2-3 GM-% IV SOLR
2.0000 g | Freq: Once | INTRAVENOUS | Status: DC
  Filled 2014-04-01: qty 50

## 2014-04-01 MED ORDER — CEFAZOLIN SODIUM-DEXTROSE 2-3 GM-% IV SOLR: 2.0000 g | INTRAVENOUS | Status: DC

## 2014-04-01 MED ORDER — GLYCOPYRROLATE 0.2 MG/ML IJ SOLN
INTRAMUSCULAR | Status: DC | PRN
  Administered 2014-04-01: 0.6 mg via INTRAVENOUS

## 2014-04-01 MED ORDER — ONDANSETRON HCL 4 MG/2ML IJ SOLN
INTRAMUSCULAR | Status: DC | PRN
  Administered 2014-04-01: 4 mg via INTRAVENOUS

## 2014-04-01 MED ORDER — ONDANSETRON HCL 4 MG/2ML IJ SOLN
INTRAMUSCULAR | Status: AC
  Filled 2014-04-01: qty 2

## 2014-04-01 MED ORDER — CEFAZOLIN SODIUM 1-5 GM-% IV SOLN
1.0000 g | Freq: Once | INTRAVENOUS | Status: AC
  Administered 2014-04-01: 1 g via INTRAVENOUS
  Filled 2014-04-01: qty 50

## 2014-04-01 MED ORDER — FENTANYL CITRATE 0.05 MG/ML IJ SOLN
INTRAMUSCULAR | Status: AC
Start: 2014-04-01 — End: 2014-04-02
  Filled 2014-04-01: qty 2

## 2014-04-01 MED ORDER — 0.9 % SODIUM CHLORIDE (POUR BTL) OPTIME
TOPICAL | Status: DC | PRN
  Administered 2014-04-01: 1000 mL

## 2014-04-01 MED ORDER — LACTATED RINGERS IV SOLN: INTRAVENOUS | Status: DC

## 2014-04-01 MED ORDER — NEOSTIGMINE METHYLSULFATE 10 MG/10ML IV SOLN
INTRAVENOUS | Status: DC | PRN
  Administered 2014-04-01: 5 mg via INTRAVENOUS

## 2014-04-01 MED ORDER — FENTANYL CITRATE 0.05 MG/ML IJ SOLN
INTRAMUSCULAR | Status: AC
  Filled 2014-04-01: qty 5

## 2014-04-01 MED ORDER — BUPIVACAINE HCL 0.5 % IJ SOLN
INTRAMUSCULAR | Status: DC | PRN
  Administered 2014-04-01: 10 mL

## 2014-04-01 MED ORDER — LIDOCAINE HCL (CARDIAC) 20 MG/ML IV SOLN
INTRAVENOUS | Status: AC
Start: 2014-04-01 — End: 2014-04-01
  Filled 2014-04-01: qty 5

## 2014-04-01 MED ORDER — LACTATED RINGERS IR SOLN
Status: DC | PRN
  Administered 2014-04-01: 1

## 2014-04-01 MED ORDER — CEFAZOLIN SODIUM 1-5 GM-% IV SOLN: 1.0000 g | Freq: Once | INTRAVENOUS | Status: DC

## 2014-04-01 MED ORDER — ROCURONIUM BROMIDE 100 MG/10ML IV SOLN
INTRAVENOUS | Status: AC
  Filled 2014-04-01: qty 1

## 2014-04-01 MED ORDER — MEPERIDINE HCL 50 MG/ML IJ SOLN: 6.2500 mg | INTRAMUSCULAR | Status: DC | PRN

## 2014-04-01 MED ORDER — PROMETHAZINE HCL 25 MG/ML IJ SOLN: 6.2500 mg | INTRAMUSCULAR | Status: DC | PRN

## 2014-04-01 MED ORDER — FENTANYL CITRATE 0.05 MG/ML IJ SOLN
INTRAMUSCULAR | Status: AC
  Filled 2014-04-01: qty 2

## 2014-04-01 MED ORDER — LORAZEPAM 2 MG/ML IJ SOLN
1.0000 mg | Freq: Once | INTRAMUSCULAR | Status: AC
  Administered 2014-04-01: 1 mg via INTRAVENOUS
  Filled 2014-04-01: qty 1

## 2014-04-01 MED ORDER — NEOSTIGMINE METHYLSULFATE 10 MG/10ML IV SOLN
INTRAVENOUS | Status: AC
  Filled 2014-04-01: qty 1

## 2014-04-01 MED ORDER — FENTANYL CITRATE 0.05 MG/ML IJ SOLN
25.0000 ug | INTRAMUSCULAR | Status: DC | PRN
  Administered 2014-04-01 (×3): 50 ug via INTRAVENOUS

## 2014-04-01 MED ORDER — MORPHINE SULFATE 30 MG PO TABS: 30.0000 mg | ORAL_TABLET | ORAL | Status: DC | PRN

## 2014-04-01 MED ORDER — MIDAZOLAM HCL 5 MG/5ML IJ SOLN
INTRAMUSCULAR | Status: DC | PRN
  Administered 2014-04-01: 2 mg via INTRAVENOUS

## 2014-04-01 MED ORDER — LIDOCAINE HCL (CARDIAC) 20 MG/ML IV SOLN
INTRAVENOUS | Status: DC | PRN
  Administered 2014-04-01: 100 mg via INTRAVENOUS

## 2014-04-01 SURGICAL SUPPLY — 38 items
APPLIER CLIP 5 13 M/L LIGAMAX5 (MISCELLANEOUS) ×3
BENZOIN TINCTURE PRP APPL 2/3 (GAUZE/BANDAGES/DRESSINGS) ×3 IMPLANT
CHLORAPREP W/TINT 26ML (MISCELLANEOUS) ×3 IMPLANT
CLIP APPLIE 5 13 M/L LIGAMAX5 (MISCELLANEOUS) ×1 IMPLANT
CLOSURE WOUND 1/2 X4 (GAUZE/BANDAGES/DRESSINGS) ×1
COVER MAYO STAND STRL (DRAPES) ×3 IMPLANT
DECANTER SPIKE VIAL GLASS SM (MISCELLANEOUS) ×3 IMPLANT
DISSECTOR BLUNT TIP ENDO 5MM (MISCELLANEOUS) IMPLANT
DRAPE C-ARM 42X120 X-RAY (DRAPES) ×3 IMPLANT
DRAPE LAPAROSCOPIC ABDOMINAL (DRAPES) ×3 IMPLANT
DRAPE UTILITY XL STRL (DRAPES) ×3 IMPLANT
DRSG TEGADERM 2-3/8X2-3/4 SM (GAUZE/BANDAGES/DRESSINGS) ×9 IMPLANT
ELECT REM PT RETURN 9FT ADLT (ELECTROSURGICAL) ×3
ELECTRODE REM PT RTRN 9FT ADLT (ELECTROSURGICAL) ×1 IMPLANT
ENDOLOOP SUT PDS II  0 18 (SUTURE)
ENDOLOOP SUT PDS II 0 18 (SUTURE) IMPLANT
GAUZE SPONGE 2X2 8PLY STRL LF (GAUZE/BANDAGES/DRESSINGS) ×1 IMPLANT
GLOVE ECLIPSE 8.0 STRL XLNG CF (GLOVE) ×3 IMPLANT
GLOVE INDICATOR 8.0 STRL GRN (GLOVE) ×3 IMPLANT
GOWN STRL REUS W/TWL XL LVL3 (GOWN DISPOSABLE) ×9 IMPLANT
HEMOSTAT SNOW SURGICEL 2X4 (HEMOSTASIS) ×3 IMPLANT
KIT BASIN OR (CUSTOM PROCEDURE TRAY) ×3 IMPLANT
POUCH SPECIMEN RETRIEVAL 10MM (ENDOMECHANICALS) ×3 IMPLANT
SCISSORS LAP 5X35 DISP (ENDOMECHANICALS) ×3 IMPLANT
SET CHOLANGIOGRAPH MIX (MISCELLANEOUS) ×3 IMPLANT
SET IRRIG TUBING LAPAROSCOPIC (IRRIGATION / IRRIGATOR) ×3 IMPLANT
SLEEVE XCEL OPT CAN 5 100 (ENDOMECHANICALS) ×6 IMPLANT
SOLUTION ANTI FOG 6CC (MISCELLANEOUS) ×3 IMPLANT
SPONGE GAUZE 2X2 STER 10/PKG (GAUZE/BANDAGES/DRESSINGS) ×2
STRIP CLOSURE SKIN 1/2X4 (GAUZE/BANDAGES/DRESSINGS) ×2 IMPLANT
SUT MNCRL AB 4-0 PS2 18 (SUTURE) ×3 IMPLANT
TOWEL OR 17X26 10 PK STRL BLUE (TOWEL DISPOSABLE) ×3 IMPLANT
TOWEL OR NON WOVEN STRL DISP B (DISPOSABLE) ×3 IMPLANT
TRAY LAPAROSCOPIC (CUSTOM PROCEDURE TRAY) ×3 IMPLANT
TROCAR BLADELESS OPT 5 100 (ENDOMECHANICALS) ×3 IMPLANT
TROCAR XCEL BLUNT TIP 100MML (ENDOMECHANICALS) ×3 IMPLANT
TROCAR XCEL NON-BLD 11X100MML (ENDOMECHANICALS) IMPLANT
TUBING INSUFFLATION 10FT LAP (TUBING) ×3 IMPLANT

## 2014-04-01 NOTE — Op Note (Signed)
Preoperative diagnosis:  Cholelithiasis, possible cholodocholithiasis  Postoperative diagnosis:  Cholelithiasis, choledocholithiasis, chronic cholecystitis  Procedure: Laparoscopic cholecystectomy with cholangiogram.  Surgeon: Jackolyn Confer, M.D.  Asst.:  Neysa Bonito, M.D.  Anesthesia: General  Indication:  This is 58 year old female with a long history of epigastric abdominal pain that has had a significant workup. She has findings concerning for gallbladder sludge/small stones versus polyp and a dilated common bile duct. MRCP was performed and it suggestive but not definitive for a possible distal common bile duct stone. She is now brought to the operating room for the above procedure.  Technique: She was brought to the operating room, placed supine on the operating table, and a general anesthetic was administered.  The abdominal wall was then sterilely prepped and draped. Local anesthetic (Marcaine) was infiltrated in the RUQ. She was placed in reverse Trendelenburg position. Using a 5 mm Optiview trocar laparoscope, access was gained into the peritoneal cavity and a pneumoperitoneum was created.  I visualized the area beneath the trocar and no underlying bleeding or organ injury was noted.  A small subumbilical incision was made through part of her previous scar. The skin and subcutaneous tissue fascia and peritoneum were divided under direct vision. A pursestring suture of 0 Vicryl was placed around the edges of the fascia. A Hassan trocar was then introduced into the peritoneal cavity.  Two more 5 mm trocars were then placed into the abdominal cavity under laparoscopic vision. One in the epigastric area, and another in the right upper quadrant area. The gallbladder was visualized.  Pericholecystic fluid was noted. No acute inflammatory changes noted but there were chronic inflammatory changes noted. The gallbladder wall was somewhat thickened and edematous.  The infundibulum was mobilized  with dissection close to the gallbladder and retracted laterally. The cystic duct was identified and a window was created around it.  It appeared dilated. The cystic artery was also identified and a window was created around it. The critical view was achieved. A clip was placed at the neck of the gallbladder. A small incision was made in the cystic duct. A cholangiocatheter was introduced through the anterior abdominal wall and placed in the cystic duct. A intraoperative cholangiogram was then performed.  Under real-time fluoroscopy, dilute contrast was injected into the cystic duct.  The common hepatic and common bile ducts both were dilated. At least one filling defect was seen in the distal common bile duct. There was delayed passage of contrast into the duodenum.  The cholangiocatheter was removed, the cystic duct was clipped 3 times on the biliary side, and then the cystic duct was divided sharply. No bile leak was noted from the cystic duct stump.  The cystic artery was then clipped and divided. Following this the gallbladder was dissected free from the liver using electrocautery. The gallbladder was then placed in a retrieval bag and removed from the abdominal cavity through the subumbilical incision.  The gallbladder fossa was inspected, irrigated, and bleeding was controlled with electrocautery. Inspection showed that hemostasis was adequate and there was no evidence of bile leak.  The irrigation fluid was evacuated as much as possible.  The subumbilical trocar was removed and the fascial defect was closed by tightening and tying down the pursestring suture under laparoscopic vision.  The remaining trocars were removed and the pneumoperitoneum was released. The skin incisions were closed with 4-0 Monocryl subcuticular stitches. Steri-Strips and sterile dressings were applied.  The procedure was well-tolerated without any apparent complications. She was taken to the recovery  room in  satisfactory condition.  Will discuss IOC findings with GI.

## 2014-04-01 NOTE — Progress Notes (Signed)
   Patient for cholecystectomy, possibly later today. She is having RUQ discomfort today, otherwise feels okay. LFTs continue to improve. Visiting with her Minister now. GI signing off, call if needed. Thanks, Tye Savoy, NP

## 2014-04-01 NOTE — Progress Notes (Signed)
[facility] PROGRESS NOTE  Jenna Molina CXK:[id number] DOB: 1955-08-13 DOA: 03/29/2014 PCP: Tawanna Solo, MD  58 year old female with history of fibromyalgia, chronic fatigue, anxiety, depression, migraine headaches, presents with complaints of abdominal pain, fever at home, ultrasound done showing sludge versus gallstones in gallbladder, with CBD or 14 mm, medicine recently at [place], had EGD/colonoscopy, significant for gastritis, and nonmalignant polyps, patient presents patient report she recently had MRCP to evaluate upper abdominal pain, where she had dilated CPD, she brought to the discal with her, which were evaluated by GI service, but they were poor quality, as well the MRI part was not done, so patient is currently admitted where she is getting MRCP done for further evaluation.     Assessment/Plan: #1 cholelithiasis/gallbladder wall thickening/, bile duct dilatation with postprandial pain Patient with MRCP confirming biliary duct dilatation with without obvious bile duct stones. Patient has been seen by general surgery and patient s/p lap cholecystectomy with IOC today. CCS to discuss with GI on IOC. GI and general surgery following and appreciate their input recommendations.  #2 history of migraine headaches Continue home regimen of Imitrex as needed.  #3 hypothyroidism Continue home dose Synthroid.  #4 depression Continue Celexa.  #5 fibromyalgia Stable. Follow.  Code Status: Full Family Communication: Updated patient and husband at bedside. Disposition Plan: Remain inpatient.   Consultants:  Gastroenterology: Dr. Ardis Hughs 03/29/2014  Gen. surgery: Dr. Elinor Parkinson 03/31/2014  Procedures:  MRCP 03/30/2014  Abdominal ultrasound 03/29/2014  Lap cholecystectomy 04/01/14. Dr Zella Richer  Antibiotics:  None  HPI/Subjective: Patient s/p lap cholecystectomy. Patient asking for pain meds.   Objective: Filed Vitals:   04/01/14 1450  BP: 136/72   Pulse: 63  Temp: 97.8 F (36.6 C)  Resp: 16    Intake/Output Summary (Last 24 hours) at 04/01/14 1455 Last data filed at 04/01/14 1430  Gross per 24 hour  Intake   3400 ml  Output      0 ml  Net   3400 ml   Filed Weights   03/29/14 1700  Weight: 57.3 kg (126 lb 5.2 oz)    Exam:   General:  NAD  Cardiovascular: RRR  Respiratory: CTAB  Abdomen: Soft, decreased tenderness to palpation the right upper quadrant, positive bowel sounds, nondistended.  Musculoskeletal: No clubbing cyanosis or edema.  Data Reviewed: Basic Metabolic Panel:  Recent Labs Lab 03/29/14 1021 03/30/14 0128 03/31/14 0438 04/01/14 0550  NA 134* 143 143 141  K 4.2 4.2 3.9 4.0  CL 98 108 107 106  CO2 23 22 26 24   GLUCOSE 118* 92 102* 111*  BUN 13 9 6 7   CREATININE 0.88 0.73 0.80 0.89  CALCIUM 9.5 8.9 9.2 8.7   Liver Function Tests:  Recent Labs Lab 03/29/14 1021 03/30/14 0128 03/31/14 0438 04/01/14 0550  AST 40* 36 25 18  ALT 116* 97* 75* 50*  ALKPHOS 217* 189* 176* 142*  BILITOT 0.9 0.8 0.8 0.5  PROT 7.3 6.5 6.5 5.8*  ALBUMIN 3.7 3.3* 3.5 3.1*    Recent Labs Lab 03/29/14 1021 03/30/14 0128 04/01/14 0550  LIPASE 100* 97* 58  AMYLASE  --  115*  --    No results for input(s): AMMONIA in the last 168 hours. CBC:  Recent Labs Lab 03/29/14 1021 03/30/14 0128 03/31/14 0438 04/01/14 0550  WBC 6.8 6.5 6.4 4.6  NEUTROABS 4.0  --   --   --   HGB 11.7* 11.6* 11.0* 9.8*  HCT 35.7* 36.1 34.3* 31.3*  MCV 85.6 87.4 87.5 88.4  PLT 298 279 314 320   Cardiac Enzymes:  Recent Labs Lab 03/29/14 1316 03/29/14 1911 03/30/14 0128  TROPONINI <0.30 <0.30 <0.30   BNP (last 3 results) No results for input(s): PROBNP in the last 8760 hours. CBG: No results for input(s): GLUCAP in the last 168 hours.  Recent Results (from the past 240 hour(s))  Urine culture     Status: None   Collection Time: 03/29/14 11:33 AM  Result Value Ref Range Status   Specimen Description URINE,  RANDOM  Final   Special Requests NONE  Final   Culture  Setup Time   Final    03/29/2014 15:43 Performed at [facility]   Final    35,000 COLONIES/ML Performed at [facility]    Culture   Final    LACTOBACILLUS SPECIES Note: Standardized susceptibility testing for this organism is not available. Performed at [facility]    Report Status 03/30/2014 FINAL  Final  Surgical pcr screen     Status: None   Collection Time: 03/31/14  9:05 PM  Result Value Ref Range Status   MRSA, PCR NEGATIVE NEGATIVE Final   Staphylococcus aureus NEGATIVE NEGATIVE Final    Comment:        The Xpert SA Assay (FDA approved for NASAL specimens in patients over 15 years of age), is one component of a comprehensive surveillance program.  Test performance has been validated by [employer] for patients greater than or equal to 37 year old. It is not intended to diagnose infection nor to guide or monitor treatment.      Studies: Dg Cholangiogram Operative  04/01/2014   CLINICAL DATA:  Intraoperative cholangiogram  EXAM: INTRAOPERATIVE CHOLANGIOGRAM  TECHNIQUE: Cholangiographic images from the C-arm fluoroscopic device were submitted for interpretation post-operatively. Please see the procedural report for the amount of contrast and the fluoroscopy time utilized.  COMPARISON:  MRCP 03/30/2014  FINDINGS: Multiple view intraoperative cholangiogram submitted. There is contrast opacification of the cystic duct and CBD, contrast is noted within intrahepatic ducts. No contrast extravasation is noted. Significant dilatation of CBD. Filling defects are noted in distal CBD just above and ampulla level consistent with CBD stones. Small amount of contrast material is passing around the stones in the duodenum.  IMPRESSION: Significant dilatation of CBD. The patient is status postcholecystectomy. Contrast opacification of cystic duct and intrahepatic ducts. No intrahepatic ductal  dilatation. Filling defects are noted in distal CBD/ampulla level. Only small amount of contrast is passing into the duodenum.  I reviewed the images with Dr. Sherren Mocha   Electronically Signed   By: Lahoma Crocker M.D.   On: 04/01/2014 13:28   Mr 3d Recon At Scanner  03/31/2014   CLINICAL DATA:  58 year old female with history of abdominal pain and weight loss, found to have an enlarged common bile duct by ultrasound.  EXAM: MRI ABDOMEN WITHOUT AND WITH CONTRAST (INCLUDING MRCP)  TECHNIQUE: Multiplanar multisequence MR imaging of the abdomen was performed both before and after the administration of intravenous contrast. Heavily T2-weighted images of the biliary and pancreatic ducts were obtained, and three-dimensional MRCP images were rendered by post processing.  CONTRAST:  66mL MULTIHANCE GADOBENATE DIMEGLUMINE 529 MG/ML IV SOLN  COMPARISON:  Abdominal ultrasound 03/29/2014.  FINDINGS: Study is slightly limited by a large amount of patient respiratory motion.  Relatively diffuse loss of signal intensity throughout the hepatic parenchyma on out of phase dual echo images, compatible with hepatic steatosis. Scattered areas of focal fatty  sparing are noted, most evident adjacent to the gallbladder fossa. In segment 2 of the liver there is a 1 cm lesion (image 22 of series 13) which is low signal intensity on T1 weighted images, high signal intensity on T2 weighted images, and does not enhance, favored to be a biliary hamartoma or small cyst. No other cystic or solid hepatic lesions are identified. Mild intrahepatic biliary ductal dilatation is noted. Gallbladder appears moderately distended, but is otherwise normal in appearance. There is a 5 mm filling defect in the gallbladder fundus, compatible with a small stone or biliary sludge ball. The common hepatic duct and common bile duct are both dilated, measuring up to 11 mm and 12 mm in diameter respectively. MRCP images demonstrate no definite filling defect within the  common hepatic or common bile duct. Thin slab T2 weighted images demonstrate potential small filling defects in the distal aspect of the common bile duct on images 33 and 34 of series 13, however, it is uncertain whether or not these are real or simply flow related artifacts. Respiratory motion severely limits assessment of the distal aspect of the common bile duct, and the head of the pancreas. With these limitations in mind no discrete pancreatic head mass is confidently identified on today's study. Additionally, there is no pancreatic ductal dilatation. There is a poorly depicted region of low T1 signal intensity, high T2 signal intensity and no enhancement immediately posterior and lateral to the distal common bile duct near the ampulla, which is favored to represent a duodenal diverticulum from the second or third portion of the duodenum. There is a similar finding (although this is predominantly low T2 signal intensity, likely because of internal gas) anteriorly to the distal common bile duct as well, which may be part of the same diverticulum, or a second more anteriorly located diverticulum, best appreciated on image 62 of series 1503.  Tiny sub cm lesions in the spleen are low T1 signal intensity, high T2 signal intensity, and do not enhance, likely small cysts. The appearance of the adrenal glands is normal bilaterally. There are subcentimeter lesions in the upper poles of the kidneys bilaterally which are low T1 signal intensity, high T2 signal intensity, and do not enhance, compatible with tiny simple cysts. Visualized portions of the lower thorax are unremarkable.  IMPRESSION: 1. Limited study secondary extensive respiratory motion. The findings are unusual in that there is dilatation of the common bile duct and common hepatic ducts, but there is no intrahepatic biliary ductal dilatation. Although there are potential small filling defects in the distal common bile duct shortly before the ampulla noted  on axial T2 weighted images, these may simply be flow related artifact, and these findings are not confirmed on additional imaging planes or additional pulse sequences. The possibility of distal ductal stones is not excluded, but is not strongly favored. Other differential considerations include a distal ductal stricture (however, any obstructing lesion would be expected to be associated with some degree of intrahepatic biliary ductal dilatation, which is not present), or potentially a type 1 choledochal cyst. Correlation with ERCP is recommended if there are clinical signs and symptoms of biliary tract obstruction. 2. Collections of gas and fluid adjacent to the distal common bile duct described above are favored to represent one or more duodenal diverticulae from the second and/or third portion of the duodenum. 3. Hepatic steatosis with areas of focal fatty sparing. 4. 5 mm filling defect in the gallbladder fundus compatible with a small gallstone or biliary  sludge ball. No current findings to suggest an acute cholecystitis at this time. 5. Additional incidental findings, as above.   Electronically Signed   By: Vinnie Langton M.D.   On: 03/31/2014 08:42   Mr Lambert Mody Cm/mrcp  03/31/2014   CLINICAL DATA:  58 year old female with history of abdominal pain and weight loss, found to have an enlarged common bile duct by ultrasound.  EXAM: MRI ABDOMEN WITHOUT AND WITH CONTRAST (INCLUDING MRCP)  TECHNIQUE: Multiplanar multisequence MR imaging of the abdomen was performed both before and after the administration of intravenous contrast. Heavily T2-weighted images of the biliary and pancreatic ducts were obtained, and three-dimensional MRCP images were rendered by post processing.  CONTRAST:  24mL MULTIHANCE GADOBENATE DIMEGLUMINE 529 MG/ML IV SOLN  COMPARISON:  Abdominal ultrasound 03/29/2014.  FINDINGS: Study is slightly limited by a large amount of patient respiratory motion.  Relatively diffuse loss of signal  intensity throughout the hepatic parenchyma on out of phase dual echo images, compatible with hepatic steatosis. Scattered areas of focal fatty sparing are noted, most evident adjacent to the gallbladder fossa. In segment 2 of the liver there is a 1 cm lesion (image 22 of series 13) which is low signal intensity on T1 weighted images, high signal intensity on T2 weighted images, and does not enhance, favored to be a biliary hamartoma or small cyst. No other cystic or solid hepatic lesions are identified. Mild intrahepatic biliary ductal dilatation is noted. Gallbladder appears moderately distended, but is otherwise normal in appearance. There is a 5 mm filling defect in the gallbladder fundus, compatible with a small stone or biliary sludge ball. The common hepatic duct and common bile duct are both dilated, measuring up to 11 mm and 12 mm in diameter respectively. MRCP images demonstrate no definite filling defect within the common hepatic or common bile duct. Thin slab T2 weighted images demonstrate potential small filling defects in the distal aspect of the common bile duct on images 33 and 34 of series 13, however, it is uncertain whether or not these are real or simply flow related artifacts. Respiratory motion severely limits assessment of the distal aspect of the common bile duct, and the head of the pancreas. With these limitations in mind no discrete pancreatic head mass is confidently identified on today's study. Additionally, there is no pancreatic ductal dilatation. There is a poorly depicted region of low T1 signal intensity, high T2 signal intensity and no enhancement immediately posterior and lateral to the distal common bile duct near the ampulla, which is favored to represent a duodenal diverticulum from the second or third portion of the duodenum. There is a similar finding (although this is predominantly low T2 signal intensity, likely because of internal gas) anteriorly to the distal common bile  duct as well, which may be part of the same diverticulum, or a second more anteriorly located diverticulum, best appreciated on image 62 of series 1503.  Tiny sub cm lesions in the spleen are low T1 signal intensity, high T2 signal intensity, and do not enhance, likely small cysts. The appearance of the adrenal glands is normal bilaterally. There are subcentimeter lesions in the upper poles of the kidneys bilaterally which are low T1 signal intensity, high T2 signal intensity, and do not enhance, compatible with tiny simple cysts. Visualized portions of the lower thorax are unremarkable.  IMPRESSION: 1. Limited study secondary extensive respiratory motion. The findings are unusual in that there is dilatation of the common bile duct and common hepatic ducts, but  there is no intrahepatic biliary ductal dilatation. Although there are potential small filling defects in the distal common bile duct shortly before the ampulla noted on axial T2 weighted images, these may simply be flow related artifact, and these findings are not confirmed on additional imaging planes or additional pulse sequences. The possibility of distal ductal stones is not excluded, but is not strongly favored. Other differential considerations include a distal ductal stricture (however, any obstructing lesion would be expected to be associated with some degree of intrahepatic biliary ductal dilatation, which is not present), or potentially a type 1 choledochal cyst. Correlation with ERCP is recommended if there are clinical signs and symptoms of biliary tract obstruction. 2. Collections of gas and fluid adjacent to the distal common bile duct described above are favored to represent one or more duodenal diverticulae from the second and/or third portion of the duodenum. 3. Hepatic steatosis with areas of focal fatty sparing. 4. 5 mm filling defect in the gallbladder fundus compatible with a small gallstone or biliary sludge ball. No current findings to  suggest an acute cholecystitis at this time. 5. Additional incidental findings, as above.   Electronically Signed   By: Vinnie Langton M.D.   On: 03/31/2014 08:42    Scheduled Meds: . citalopram  40 mg Oral QHS  . cyclobenzaprine  20 mg Oral QHS  . docusate sodium  50 mg Oral Daily  . fentaNYL      . fentaNYL      . heparin subcutaneous  5,000 Units Subcutaneous Q8H  . levothyroxine  100 mcg Oral QAC breakfast  . pantoprazole  40 mg Oral Daily  . sodium chloride  3 mL Intravenous Q12H  . topiramate  200 mg Oral q morning - 10a   Continuous Infusions: . sodium chloride 100 mL/hr at 03/31/14 1154    Active Problems:   Abdominal pain, acute   Abdominal pain   RUQ abdominal pain   Headache, migraine   Thyroid activity decreased    Time spent: 35 mins    [facility] MD [facility] Pager [phone]. If 7PM-7AM, please contact night-coverage at [email or web address], password [facility] 04/01/2014, 2:55 PM  LOS: 3 days

## 2014-04-01 NOTE — Anesthesia Postprocedure Evaluation (Signed)
  Anesthesia Post-op Note  Patient: Jenna Molina  Procedure(s) Performed: Procedure(s) (LRB): LAPAROSCOPIC CHOLECYSTECTOMY WITH INTRAOPERATIVE CHOLANGIOGRAM (N/A)  Patient Location: PACU  Anesthesia Type: General  Level of Consciousness: awake and alert   Airway and Oxygen Therapy: Patient Spontanous Breathing  Post-op Pain: mild  Post-op Assessment: Post-op Vital signs reviewed, Patient's Cardiovascular Status Stable, Respiratory Function Stable, Patent Airway and No signs of Nausea or vomiting  Last Vitals:  Filed Vitals:   04/01/14 1430  BP: 130/67  Pulse: 62  Temp: 36.7 C  Resp: 20    Post-op Vital Signs: stable   Complications: No apparent anesthesia complications

## 2014-04-01 NOTE — Anesthesia Preprocedure Evaluation (Signed)
Anesthesia Evaluation  Patient identified by MRN, date of birth, ID band Patient awake    Reviewed: Allergy & Precautions, H&P , NPO status , Patient's Chart, lab work & pertinent test results  Airway Mallampati: II  TM Distance: >3 FB Neck ROM: Full    Dental no notable dental hx. (+) Edentulous Upper, Edentulous Lower   Pulmonary neg pulmonary ROS, former smoker,  breath sounds clear to auscultation  Pulmonary exam normal       Cardiovascular negative cardio ROS  Rhythm:Regular Rate:Normal     Neuro/Psych negative neurological ROS  negative psych ROS   GI/Hepatic negative GI ROS, Neg liver ROS,   Endo/Other  negative endocrine ROS  Renal/GU negative Renal ROS  negative genitourinary   Musculoskeletal  (+) Fibromyalgia -  Abdominal   Peds negative pediatric ROS (+)  Hematology negative hematology ROS (+)   Anesthesia Other Findings   Reproductive/Obstetrics negative OB ROS                             Anesthesia Physical Anesthesia Plan  ASA: II  Anesthesia Plan: General   Post-op Pain Management:    Induction: Intravenous  Airway Management Planned: Oral ETT  Additional Equipment:   Intra-op Plan:   Post-operative Plan: Extubation in OR  Informed Consent: I have reviewed the patients History and Physical, chart, labs and discussed the procedure including the risks, benefits and alternatives for the proposed anesthesia with the patient or authorized representative who has indicated his/her understanding and acceptance.   Dental advisory given  Plan Discussed with: CRNA  Anesthesia Plan Comments:         Anesthesia Quick Evaluation

## 2014-04-01 NOTE — Transfer of Care (Signed)
Immediate Anesthesia Transfer of Care Note  Patient: Jenna Molina  Procedure(s) Performed: Procedure(s) (LRB): LAPAROSCOPIC CHOLECYSTECTOMY WITH INTRAOPERATIVE CHOLANGIOGRAM (N/A)  Patient Location: PACU  Anesthesia Type: General  Level of Consciousness: sedated, patient cooperative and responds to stimulation  Airway & Oxygen Therapy: Patient Spontanous Breathing and Patient connected to face mask oxgen  Post-op Assessment: Report given to PACU RN and Post -op Vital signs reviewed and stable  Post vital signs: Reviewed and stable  Complications: No apparent anesthesia complications

## 2014-04-02 ENCOUNTER — Inpatient Hospital Stay (HOSPITAL_COMMUNITY): Payer: Managed Care, Other (non HMO)

## 2014-04-02 ENCOUNTER — Encounter (HOSPITAL_COMMUNITY): Payer: Self-pay | Admitting: Gastroenterology

## 2014-04-02 ENCOUNTER — Inpatient Hospital Stay (HOSPITAL_COMMUNITY): Payer: Managed Care, Other (non HMO) | Admitting: Anesthesiology

## 2014-04-02 ENCOUNTER — Encounter (HOSPITAL_COMMUNITY)
Admission: EM | Disposition: A | Discharge status: Home / Self Care | Payer: Self-pay | Source: Home / Self Care | Attending: Internal Medicine

## 2014-04-02 DIAGNOSIS — K8045 Calculus of bile duct with chronic cholecystitis with obstruction: Secondary | ICD-10-CM

## 2014-04-02 DIAGNOSIS — K8011 Calculus of gallbladder with chronic cholecystitis with obstruction: Secondary | ICD-10-CM | POA: Diagnosis present

## 2014-04-02 HISTORY — PX: ERCP: SHX5425

## 2014-04-02 LAB — CBC WITH DIFFERENTIAL/PLATELET
BASOS ABS: 0 10*3/uL (ref 0.0–0.1)
Basophils Relative: 0 % (ref 0–1)
Eosinophils Absolute: 0 10*3/uL (ref 0.0–0.7)
Eosinophils Relative: 0 % (ref 0–5)
HCT: 32.4 % — ABNORMAL LOW (ref 36.0–46.0)
Hemoglobin: 10.3 g/dL — ABNORMAL LOW (ref 12.0–15.0)
LYMPHS ABS: 1.8 10*3/uL (ref 0.7–4.0)
LYMPHS PCT: 13 % (ref 12–46)
MCH: 27.9 pg (ref 26.0–34.0)
MCHC: 31.8 g/dL (ref 30.0–36.0)
MCV: 87.8 fL (ref 78.0–100.0)
Monocytes Absolute: 0.9 10*3/uL (ref 0.1–1.0)
Monocytes Relative: 6 % (ref 3–12)
NEUTROS PCT: 81 % — AB (ref 43–77)
Neutro Abs: 11.3 10*3/uL — ABNORMAL HIGH (ref 1.7–7.7)
PLATELETS: 384 10*3/uL (ref 150–400)
RBC: 3.69 MIL/uL — AB (ref 3.87–5.11)
RDW: 14.2 % (ref 11.5–15.5)
WBC: 14.1 10*3/uL — ABNORMAL HIGH (ref 4.0–10.5)

## 2014-04-02 LAB — COMPREHENSIVE METABOLIC PANEL
ALT: 98 U/L — ABNORMAL HIGH (ref 0–35)
ANION GAP: 13 (ref 5–15)
AST: 60 U/L — ABNORMAL HIGH (ref 0–37)
Albumin: 3.5 g/dL (ref 3.5–5.2)
Alkaline Phosphatase: 216 U/L — ABNORMAL HIGH (ref 39–117)
BILIRUBIN TOTAL: 0.8 mg/dL (ref 0.3–1.2)
BUN: 7 mg/dL (ref 6–23)
CALCIUM: 9.2 mg/dL (ref 8.4–10.5)
CO2: 25 meq/L (ref 19–32)
CREATININE: 0.94 mg/dL (ref 0.50–1.10)
Chloride: 103 mEq/L (ref 96–112)
GFR, EST AFRICAN AMERICAN: 75 mL/min — AB (ref >90)
GFR, EST NON AFRICAN AMERICAN: 65 mL/min — AB (ref >90)
GLUCOSE: 110 mg/dL — AB (ref 70–99)
Potassium: 3.7 mEq/L (ref 3.7–5.3)
Sodium: 141 mEq/L (ref 137–147)
Total Protein: 6.5 g/dL (ref 6.0–8.3)

## 2014-04-02 SURGERY — ERCP, WITH INTERVENTION IF INDICATED
Anesthesia: Monitor Anesthesia Care

## 2014-04-02 SURGERY — ERCP, WITH INTERVENTION IF INDICATED
Anesthesia: General

## 2014-04-02 MED ORDER — INDOMETHACIN 50 MG RE SUPP
100.0000 mg | Freq: Once | RECTAL | Status: AC
Start: 2014-04-02 — End: 2014-04-02
  Administered 2014-04-02: 100 mg via RECTAL
  Filled 2014-04-02: qty 2

## 2014-04-02 MED ORDER — LACTATED RINGERS IV SOLN
INTRAVENOUS | Status: DC | PRN
  Administered 2014-04-02: 08:00:00 via INTRAVENOUS

## 2014-04-02 MED ORDER — FENTANYL CITRATE 0.05 MG/ML IJ SOLN
50.0000 ug | INTRAMUSCULAR | Status: DC | PRN
  Administered 2014-04-02 (×2): 50 ug via INTRAVENOUS

## 2014-04-02 MED ORDER — LIDOCAINE HCL (CARDIAC) 20 MG/ML IV SOLN
INTRAVENOUS | Status: DC | PRN
  Administered 2014-04-02: 40 mg via INTRAVENOUS

## 2014-04-02 MED ORDER — LIDOCAINE HCL (CARDIAC) 20 MG/ML IV SOLN
INTRAVENOUS | Status: AC
  Filled 2014-04-02: qty 5

## 2014-04-02 MED ORDER — LACTATED RINGERS IV SOLN: INTRAVENOUS | Status: DC

## 2014-04-02 MED ORDER — FENTANYL CITRATE 0.05 MG/ML IJ SOLN
INTRAMUSCULAR | Status: AC
  Filled 2014-04-02: qty 2

## 2014-04-02 MED ORDER — SUCCINYLCHOLINE CHLORIDE 20 MG/ML IJ SOLN
INTRAMUSCULAR | Status: DC | PRN
  Administered 2014-04-02: 100 mg via INTRAVENOUS

## 2014-04-02 MED ORDER — HYDROCODONE-ACETAMINOPHEN 10-325 MG PO TABS
2.0000 | ORAL_TABLET | Freq: Three times a day (TID) | ORAL | Status: DC
  Administered 2014-04-02 – 2014-04-03 (×4): 2 via ORAL
  Filled 2014-04-02 (×4): qty 2

## 2014-04-02 MED ORDER — PROPOFOL 10 MG/ML IV BOLUS
INTRAVENOUS | Status: DC | PRN
  Administered 2014-04-02: 120 mg via INTRAVENOUS

## 2014-04-02 MED ORDER — FENTANYL CITRATE 0.05 MG/ML IJ SOLN
INTRAMUSCULAR | Status: DC | PRN
  Administered 2014-04-02 (×2): 50 ug via INTRAVENOUS

## 2014-04-02 MED ORDER — CIPROFLOXACIN IN D5W 400 MG/200ML IV SOLN
400.0000 mg | Freq: Once | INTRAVENOUS | Status: AC
  Administered 2014-04-02: 400 mg via INTRAVENOUS
  Filled 2014-04-02: qty 200

## 2014-04-02 MED ORDER — IOHEXOL 300 MG/ML  SOLN
INTRAMUSCULAR | Status: DC | PRN
  Administered 2014-04-02: 30 mL

## 2014-04-02 MED ORDER — FENTANYL CITRATE 0.05 MG/ML IJ SOLN
50.0000 ug | INTRAMUSCULAR | Status: DC | PRN
  Administered 2014-04-02: 100 ug via INTRAVENOUS

## 2014-04-02 MED ORDER — CIPROFLOXACIN IN D5W 400 MG/200ML IV SOLN
INTRAVENOUS | Status: AC
  Filled 2014-04-02: qty 200

## 2014-04-02 MED ORDER — PROPOFOL 10 MG/ML IV BOLUS
INTRAVENOUS | Status: AC
  Filled 2014-04-02: qty 20

## 2014-04-02 MED ORDER — ONDANSETRON HCL 4 MG/2ML IJ SOLN
INTRAMUSCULAR | Status: AC
  Filled 2014-04-02: qty 2

## 2014-04-02 MED ORDER — HYDROMORPHONE HCL 1 MG/ML IJ SOLN: 0.2500 mg | INTRAMUSCULAR | Status: DC | PRN

## 2014-04-02 MED ORDER — LORAZEPAM 2 MG/ML IJ SOLN
1.0000 mg | Freq: Once | INTRAMUSCULAR | Status: AC
  Administered 2014-04-02: 1 mg via INTRAVENOUS
  Filled 2014-04-02: qty 1

## 2014-04-02 MED ORDER — LACTATED RINGERS IV SOLN
INTRAVENOUS | Status: DC
  Administered 2014-04-02: 1000 mL via INTRAVENOUS

## 2014-04-02 NOTE — Transfer of Care (Signed)
Immediate Anesthesia Transfer of Care Note  Patient: Jenna Molina  Procedure(s) Performed: Procedure(s): ENDOSCOPIC RETROGRADE CHOLANGIOPANCREATOGRAPHY (ERCP) (N/A)  Patient Location: PACU  Anesthesia Type:General  Level of Consciousness: awake, alert  and oriented  Airway & Oxygen Therapy: Patient Spontanous Breathing and Patient connected to face mask oxygen  Post-op Assessment: Report given to PACU RN and Post -op Vital signs reviewed and stable  Post vital signs: Reviewed and stable  Complications: No apparent anesthesia complications

## 2014-04-02 NOTE — H&P (View-Only) (Signed)
Progress Note   Subjective  Feels okay. Had some transient RUQ pain yesterday but nothing like what she had when admitted to hospital.     Objective   Vital signs in last 24 hours: Temp:  [97.8 F (36.6 C)-98.3 F (36.8 C)] 97.8 F (36.6 C) (12/16 0441) Pulse Rate:  [64-78] 69 (12/16 0441) Resp:  [18] 18 (12/16 0441) BP: (98-116)/(65-67) 116/65 mmHg (12/16 0441) SpO2:  [97 %-99 %] 98 % (12/16 0441) Weight:  [126 lb (57.153 kg)] 126 lb (57.153 kg) (12/15 1825) Last BM Date: 03/26/14 General:    white female in NAD Heart:  Regular rate and rhythm Abdomen:  Soft, mild RUQ tenderness, nondistended.  Extremities:  Without edema. Neurologic:  Alert and oriented,  grossly normal neurologically. Psych:  Cooperative. Normal mood and affect.     Lab Results:  Recent Labs  03/29/14 1021 03/30/14 0128 03/31/14 0438  WBC 6.8 6.5 6.4  HGB 11.7* 11.6* 11.0*  HCT 35.7* 36.1 34.3*  PLT 298 279 314   BMET  Recent Labs  03/29/14 1021 03/30/14 0128 03/31/14 0438  NA 134* 143 143  K 4.2 4.2 3.9  CL 98 108 107  CO2 23 22 26   GLUCOSE 118* 92 102*  BUN 13 9 6   CREATININE 0.88 0.73 0.80  CALCIUM 9.5 8.9 9.2   LFT  Recent Labs  03/31/14 0438  PROT 6.5  ALBUMIN 3.5  AST 25  ALT 75*  ALKPHOS 176*  BILITOT 0.8  BILIDIR 0.3  IBILI 0.5    Studies/Results:  Mr 3d Recon At Scanner  03/31/2014   CLINICAL DATA:  58 year old female with history of abdominal pain and weight loss, found to have an enlarged common bile duct by ultrasound.  EXAM: MRI ABDOMEN WITHOUT AND WITH CONTRAST (INCLUDING MRCP)  TECHNIQUE: Multiplanar multisequence MR imaging of the abdomen was performed both before and after the administration of intravenous contrast. Heavily T2-weighted images of the biliary and pancreatic ducts were obtained, and three-dimensional MRCP images were rendered by post processing.  CONTRAST:  45mL MULTIHANCE GADOBENATE DIMEGLUMINE 529 MG/ML IV SOLN  COMPARISON:  Abdominal  ultrasound 03/29/2014.  FINDINGS: Study is slightly limited by a large amount of patient respiratory motion.  Relatively diffuse loss of signal intensity throughout the hepatic parenchyma on out of phase dual echo images, compatible with hepatic steatosis. Scattered areas of focal fatty sparing are noted, most evident adjacent to the gallbladder fossa. In segment 2 of the liver there is a 1 cm lesion (image 22 of series 13) which is low signal intensity on T1 weighted images, high signal intensity on T2 weighted images, and does not enhance, favored to be a biliary hamartoma or small cyst. No other cystic or solid hepatic lesions are identified. Mild intrahepatic biliary ductal dilatation is noted. Gallbladder appears moderately distended, but is otherwise normal in appearance. There is a 5 mm filling defect in the gallbladder fundus, compatible with a small stone or biliary sludge ball. The common hepatic duct and common bile duct are both dilated, measuring up to 11 mm and 12 mm in diameter respectively. MRCP images demonstrate no definite filling defect within the common hepatic or common bile duct. Thin slab T2 weighted images demonstrate potential small filling defects in the distal aspect of the common bile duct on images 33 and 34 of series 13, however, it is uncertain whether or not these are real or simply flow related artifacts. Respiratory motion severely limits assessment of the distal aspect of the  common bile duct, and the head of the pancreas. With these limitations in mind no discrete pancreatic head mass is confidently identified on today's study. Additionally, there is no pancreatic ductal dilatation. There is a poorly depicted region of low T1 signal intensity, high T2 signal intensity and no enhancement immediately posterior and lateral to the distal common bile duct near the ampulla, which is favored to represent a duodenal diverticulum from the second or third portion of the duodenum. There is  a similar finding (although this is predominantly low T2 signal intensity, likely because of internal gas) anteriorly to the distal common bile duct as well, which may be part of the same diverticulum, or a second more anteriorly located diverticulum, best appreciated on image 62 of series 1503.  Tiny sub cm lesions in the spleen are low T1 signal intensity, high T2 signal intensity, and do not enhance, likely small cysts. The appearance of the adrenal glands is normal bilaterally. There are subcentimeter lesions in the upper poles of the kidneys bilaterally which are low T1 signal intensity, high T2 signal intensity, and do not enhance, compatible with tiny simple cysts. Visualized portions of the lower thorax are unremarkable.  IMPRESSION: 1. Limited study secondary extensive respiratory motion. The findings are unusual in that there is dilatation of the common bile duct and common hepatic ducts, but there is no intrahepatic biliary ductal dilatation. Although there are potential small filling defects in the distal common bile duct shortly before the ampulla noted on axial T2 weighted images, these may simply be flow related artifact, and these findings are not confirmed on additional imaging planes or additional pulse sequences. The possibility of distal ductal stones is not excluded, but is not strongly favored. Other differential considerations include a distal ductal stricture (however, any obstructing lesion would be expected to be associated with some degree of intrahepatic biliary ductal dilatation, which is not present), or potentially a type 1 choledochal cyst. Correlation with ERCP is recommended if there are clinical signs and symptoms of biliary tract obstruction. 2. Collections of gas and fluid adjacent to the distal common bile duct described above are favored to represent one or more duodenal diverticulae from the second and/or third portion of the duodenum. 3. Hepatic steatosis with areas of focal  fatty sparing. 4. 5 mm filling defect in the gallbladder fundus compatible with a small gallstone or biliary sludge ball. No current findings to suggest an acute cholecystitis at this time. 5. Additional incidental findings, as above.   Electronically Signed   By: Vinnie Langton M.D.   On: 03/31/2014 08:42   Mr Lambert Mody Cm/mrcp  03/31/2014   CLINICAL DATA:  58 year old female with history of abdominal pain and weight loss, found to have an enlarged common bile duct by ultrasound.  EXAM: MRI ABDOMEN WITHOUT AND WITH CONTRAST (INCLUDING MRCP)  TECHNIQUE: Multiplanar multisequence MR imaging of the abdomen was performed both before and after the administration of intravenous contrast. Heavily T2-weighted images of the biliary and pancreatic ducts were obtained, and three-dimensional MRCP images were rendered by post processing.  CONTRAST:  27mL MULTIHANCE GADOBENATE DIMEGLUMINE 529 MG/ML IV SOLN  COMPARISON:  Abdominal ultrasound 03/29/2014.  FINDINGS: Study is slightly limited by a large amount of patient respiratory motion.  Relatively diffuse loss of signal intensity throughout the hepatic parenchyma on out of phase dual echo images, compatible with hepatic steatosis. Scattered areas of focal fatty sparing are noted, most evident adjacent to the gallbladder fossa. In segment 2 of the liver  there is a 1 cm lesion (image 22 of series 13) which is low signal intensity on T1 weighted images, high signal intensity on T2 weighted images, and does not enhance, favored to be a biliary hamartoma or small cyst. No other cystic or solid hepatic lesions are identified. Mild intrahepatic biliary ductal dilatation is noted. Gallbladder appears moderately distended, but is otherwise normal in appearance. There is a 5 mm filling defect in the gallbladder fundus, compatible with a small stone or biliary sludge ball. The common hepatic duct and common bile duct are both dilated, measuring up to 11 mm and 12 mm in diameter  respectively. MRCP images demonstrate no definite filling defect within the common hepatic or common bile duct. Thin slab T2 weighted images demonstrate potential small filling defects in the distal aspect of the common bile duct on images 33 and 34 of series 13, however, it is uncertain whether or not these are real or simply flow related artifacts. Respiratory motion severely limits assessment of the distal aspect of the common bile duct, and the head of the pancreas. With these limitations in mind no discrete pancreatic head mass is confidently identified on today's study. Additionally, there is no pancreatic ductal dilatation. There is a poorly depicted region of low T1 signal intensity, high T2 signal intensity and no enhancement immediately posterior and lateral to the distal common bile duct near the ampulla, which is favored to represent a duodenal diverticulum from the second or third portion of the duodenum. There is a similar finding (although this is predominantly low T2 signal intensity, likely because of internal gas) anteriorly to the distal common bile duct as well, which may be part of the same diverticulum, or a second more anteriorly located diverticulum, best appreciated on image 62 of series 1503.  Tiny sub cm lesions in the spleen are low T1 signal intensity, high T2 signal intensity, and do not enhance, likely small cysts. The appearance of the adrenal glands is normal bilaterally. There are subcentimeter lesions in the upper poles of the kidneys bilaterally which are low T1 signal intensity, high T2 signal intensity, and do not enhance, compatible with tiny simple cysts. Visualized portions of the lower thorax are unremarkable.  IMPRESSION: 1. Limited study secondary extensive respiratory motion. The findings are unusual in that there is dilatation of the common bile duct and common hepatic ducts, but there is no intrahepatic biliary ductal dilatation. Although there are potential small  filling defects in the distal common bile duct shortly before the ampulla noted on axial T2 weighted images, these may simply be flow related artifact, and these findings are not confirmed on additional imaging planes or additional pulse sequences. The possibility of distal ductal stones is not excluded, but is not strongly favored. Other differential considerations include a distal ductal stricture (however, any obstructing lesion would be expected to be associated with some degree of intrahepatic biliary ductal dilatation, which is not present), or potentially a type 1 choledochal cyst. Correlation with ERCP is recommended if there are clinical signs and symptoms of biliary tract obstruction. 2. Collections of gas and fluid adjacent to the distal common bile duct described above are favored to represent one or more duodenal diverticulae from the second and/or third portion of the duodenum. 3. Hepatic steatosis with areas of focal fatty sparing. 4. 5 mm filling defect in the gallbladder fundus compatible with a small gallstone or biliary sludge ball. No current findings to suggest an acute cholecystitis at this time. 5. Additional incidental  findings, as above.   Electronically Signed   By: Vinnie Langton M.D.   On: 03/31/2014 08:42   Mr Outside Films Chest  03/29/2014   This examination belongs to an outside facility and is stored  here for comparison purposes only.  Contact the originating outside  institution for any associated report or interpretation.      Assessment / Plan:    58 year old female with intermittent postprandial abdominal pain, abnormal LFTs, CBD dilation. LFTs improving. MRI with MRCP limited by motion artifact but reconfirms biliary duct dilation without obvious bile duct stones. Recommend surgical consult for consideration of cholecystectomy. Most likely needs surgical consult soon for consideration of cholecystectomy with IOC. I will place the consult    LOS: 2 days    Tye Savoy  03/31/2014, 9:12 AM   ________________________________________________________________________  Velora Heckler GI MD note:  I personally examined the patient, reviewed the data and agree with the assessment and plan described above.  Dr. Zella Richer is planning lap chole tomorrow. Await IOC results   Owens Loffler, MD [facility] Pager [phone]

## 2014-04-02 NOTE — Interval H&P Note (Signed)
History and Physical Interval Note:  04/02/2014 7:47 AM  Jenna Molina  has presented today for surgery, with the diagnosis of STONES  The various methods of treatment have been discussed with the patient and family. After consideration of risks, benefits and other options for treatment, the patient has consented to  Procedure(s): ENDOSCOPIC RETROGRADE CHOLANGIOPANCREATOGRAPHY (ERCP) (N/A) as a surgical intervention .  The patient's history has been reviewed, patient examined, no change in status, stable for surgery.  I have reviewed the patient's chart and labs.  Questions were answered to the patient's satisfaction.     Milus Banister

## 2014-04-02 NOTE — Anesthesia Preprocedure Evaluation (Addendum)
Anesthesia Evaluation  Patient identified by MRN, date of birth, ID band Patient awake    Reviewed: Allergy & Precautions, H&P , NPO status , Patient's Chart, lab work & pertinent test results  Airway Mallampati: II  TM Distance: >3 FB Neck ROM: Full    Dental no notable dental hx. (+) Edentulous Upper, Edentulous Lower   Pulmonary neg pulmonary ROS, former smoker,  breath sounds clear to auscultation  Pulmonary exam normal       Cardiovascular negative cardio ROS  Rhythm:Regular Rate:Normal     Neuro/Psych Depression negative neurological ROS  negative psych ROS   GI/Hepatic negative GI ROS, Neg liver ROS,   Endo/Other  negative endocrine ROSHypothyroidism   Renal/GU negative Renal ROS  negative genitourinary   Musculoskeletal  (+) Fibromyalgia -  Abdominal   Peds negative pediatric ROS (+)  Hematology negative hematology ROS (+) anemia , hgb 10.3   Anesthesia Other Findings   Reproductive/Obstetrics negative OB ROS                            Anesthesia Physical Anesthesia Plan  ASA: II  Anesthesia Plan: General   Post-op Pain Management:    Induction: Intravenous  Airway Management Planned: Oral ETT  Additional Equipment:   Intra-op Plan:   Post-operative Plan: Extubation in OR  Informed Consent:   Plan Discussed with: Surgeon  Anesthesia Plan Comments:         Anesthesia Quick Evaluation

## 2014-04-02 NOTE — Anesthesia Postprocedure Evaluation (Signed)
  Anesthesia Post-op Note  Patient: Jenna Molina  Procedure(s) Performed: Procedure(s) (LRB): ENDOSCOPIC RETROGRADE CHOLANGIOPANCREATOGRAPHY (ERCP) (N/A)  Patient Location: PACU  Anesthesia Type: General  Level of Consciousness: awake and alert   Airway and Oxygen Therapy: Patient Spontanous Breathing  Post-op Pain: mild  Post-op Assessment: Post-op Vital signs reviewed, Patient's Cardiovascular Status Stable, Respiratory Function Stable, Patent Airway and No signs of Nausea or vomiting  Last Vitals:  Filed Vitals:   04/02/14 1015  BP: 92/56  Pulse: 89  Temp: 37.9 C  Resp: 12    Post-op Vital Signs: stable   Complications: No apparent anesthesia complications

## 2014-04-02 NOTE — Op Note (Signed)
[facility] [place], [address]   ERCP PROCEDURE REPORT  PATIENT: Jenna Molina, Jenna Molina  MR#: [id number] BIRTHDATE: November 04, 1955  GENDER: female  ENDOSCOPIST: Milus Banister, MD  PROCEDURE DATE:  04/02/2014 PROCEDURE:   ERCP with sphincterotomy/papillotomy INDICATIONS:abnormal IOC yesterday.  MEDICATIONS: Per Anesthesia and cipro 400mg  IV, indomethacin 100mg  PR TOPICAL ANESTHETIC:   none  DESCRIPTION OF PROCEDURE:     Physical exam was performed.  Informed consent was obtained from the patient after explaining the benefits, risks, and alternatives to the procedure.  The patient was connected to the monitor and placed in the semi-prone position. IV medicine was administered through an indwelling cannula and oxygen via endotracheal tube.  After administration of sedation, the patients esophagus was intubated and the Pentax Ercp Scope [id number]  endoscope was advanced under direct visualization to the second portion of the duodenum without detailed examination of the UGI tract. 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  A 44 Autotome over a .035 hydrawire was used to cannulate the bile duct and contrast was injected. Cholangiogram revealed a diffusely dilated extrahepatic biliary tree (CBD up to 47mm) without clear filling defects. There was no biliary leak.  Given the findings on IOC, I elected to performed biliary sphincterotomy and balloon sweeping of the bile duct. No stones or stone debris was delivered into the duodenum. Completion, occlusion cholangiogram showed no filling defects and the procedure was completed.   The scope was then completely withdrawn from the patient and the procedure terminated.  The main pancreatic duct was cannulated one time with the wire but never injected with dye.    COMPLICATIONS:    None  ENDOSCOPIC  IMPRESSION: Dilated extrahepatic biliary tree.  No obvious retained stones but given IOC yesterday I performed a biliary sphincterotomy and swept the duct several times.  The large, bilobed duodenal diverticulum may be contributing to her dilated extrahepatic bile duct.  RECOMMENDATIONS: Follow clincally, OK to go home from [facility] later today. She will need to be on her usual chronic narcotic pain medicines. Follow up with me in office as needed.    _______________________________ eSignedMilus Banister, MD 04/02/2014 10:01 AM

## 2014-04-02 NOTE — Progress Notes (Signed)
[facility] PROGRESS NOTE  Jenna Molina BWL:[id number] DOB: 1955-05-26 DOA: 03/29/2014 PCP: Tawanna Solo, MD  58 year old female with history of fibromyalgia, chronic fatigue, anxiety, depression, migraine headaches, presents with complaints of abdominal pain, fever at home, ultrasound done showing sludge versus gallstones in gallbladder, with CBD or 14 mm, medicine recently at [facility], had EGD/colonoscopy, significant for gastritis, and nonmalignant polyps, patient presents patient report she recently had MRCP to evaluate upper abdominal pain, where she had dilated CPD, she brought to the discal with her, which were evaluated by GI service, but they were poor quality, as well the MRI part was not done, so patient is currently admitted where she is getting MRCP done for further evaluation. Patient subsequently underwent laparoscopic cholecystectomy with and abnormal IOC patient underwent ERCP with sphincterectomy and papillotomy today per GI.     Assessment/Plan: #1 cholelithiasis/gallbladder wall thickening/, bile duct dilatation with postprandial pain Patient with MRCP confirming biliary duct dilatation with without obvious bile duct stones. Patient has been seen by general surgery and patient s/p lap cholecystectomy with IOC yesterday. Due to abnormal IOC patient subsequently underwent ERCP with sphincterectomy/papillotomy per GI today. Patient with significant clinical improvement. GI and general surgery following and appreciate their input recommendations.  #2 history of migraine headaches Continue home regimen of Imitrex as needed.  #3 hypothyroidism Continue home dose Synthroid.  #4 depression Continue Celexa.  #5 fibromyalgia Stable. Follow.  Code Status: Full Family Communication: Updated patient and husband at bedside. Disposition Plan: Remain inpatient, hopefully home tomorrow   Consultants:  Gastroenterology: Dr. Ardis Hughs 03/29/2014  Gen. surgery: Dr.  Elinor Parkinson 03/31/2014  Procedures:  MRCP 03/30/2014  Abdominal ultrasound 03/29/2014  Lap cholecystectomy 04/01/14. Dr Zella Richer  ERCP with sphincterectomy/papillotomy Dr. Ardis Hughs 04/02/2014  Antibiotics:  None  HPI/Subjective: Patient s/p ERCP. Patient states she's feeling a whole lot better. Patient denies any abdominal pain.  Objective: Filed Vitals:   04/02/14 1529  BP: 121/73  Pulse: 88  Temp: 98.4 F (36.9 C)  Resp: 15    Intake/Output Summary (Last 24 hours) at 04/02/14 1805 Last data filed at 04/02/14 1500  Gross per 24 hour  Intake   1810 ml  Output      0 ml  Net   1810 ml   Filed Weights   03/29/14 1700  Weight: 57.3 kg (126 lb 5.2 oz)    Exam:   General:  NAD  Cardiovascular: RRR  Respiratory: CTAB  Abdomen: Soft,NTTP, positive bowel sounds, nondistended.  Musculoskeletal: No clubbing cyanosis or edema.  Data Reviewed: Basic Metabolic Panel:  Recent Labs Lab 03/29/14 1021 03/30/14 0128 03/31/14 0438 04/01/14 0550 04/02/14 0507  NA 134* 143 143 141 141  K 4.2 4.2 3.9 4.0 3.7  CL 98 108 107 106 103  CO2 23 22 26 24 25   GLUCOSE 118* 92 102* 111* 110*  BUN 13 9 6 7 7   CREATININE 0.88 0.73 0.80 0.89 0.94  CALCIUM 9.5 8.9 9.2 8.7 9.2   Liver Function Tests:  Recent Labs Lab 03/29/14 1021 03/30/14 0128 03/31/14 0438 04/01/14 0550 04/02/14 0507  AST 40* 36 25 18 60*  ALT 116* 97* 75* 50* 98*  ALKPHOS 217* 189* 176* 142* 216*  BILITOT 0.9 0.8 0.8 0.5 0.8  PROT 7.3 6.5 6.5 5.8* 6.5  ALBUMIN 3.7 3.3* 3.5 3.1* 3.5    Recent Labs Lab 03/29/14 1021 03/30/14 0128 04/01/14 0550  LIPASE 100* 97* 58  AMYLASE  --  115*  --    No results for input(s):  AMMONIA in the last 168 hours. CBC:  Recent Labs Lab 03/29/14 1021 03/30/14 0128 03/31/14 0438 04/01/14 0550 04/02/14 0507  WBC 6.8 6.5 6.4 4.6 14.1*  NEUTROABS 4.0  --   --   --  11.3*  HGB 11.7* 11.6* 11.0* 9.8* 10.3*  HCT 35.7* 36.1 34.3* 31.3* 32.4*  MCV 85.6 87.4  87.5 88.4 87.8  PLT 298 279 314 320 384   Cardiac Enzymes:  Recent Labs Lab 03/29/14 1316 03/29/14 1911 03/30/14 0128  TROPONINI <0.30 <0.30 <0.30   BNP (last 3 results) No results for input(s): PROBNP in the last 8760 hours. CBG: No results for input(s): GLUCAP in the last 168 hours.  Recent Results (from the past 240 hour(s))  Urine culture     Status: None   Collection Time: 03/29/14 11:33 AM  Result Value Ref Range Status   Specimen Description URINE, RANDOM  Final   Special Requests NONE  Final   Culture  Setup Time   Final    03/29/2014 15:43 Performed at [facility]   Final    35,000 COLONIES/ML Performed at [facility]    Culture   Final    LACTOBACILLUS SPECIES Note: Standardized susceptibility testing for this organism is not available. Performed at [facility]    Report Status 03/30/2014 FINAL  Final  Surgical pcr screen     Status: None   Collection Time: 03/31/14  9:05 PM  Result Value Ref Range Status   MRSA, PCR NEGATIVE NEGATIVE Final   Staphylococcus aureus NEGATIVE NEGATIVE Final    Comment:        The Xpert SA Assay (FDA approved for NASAL specimens in patients over 75 years of age), is one component of a comprehensive surveillance program.  Test performance has been validated by [employer] for patients greater than or equal to 51 year old. It is not intended to diagnose infection nor to guide or monitor treatment.      Studies: Dg Cholangiogram Operative  04/01/2014   CLINICAL DATA:  Intraoperative cholangiogram  EXAM: INTRAOPERATIVE CHOLANGIOGRAM  TECHNIQUE: Cholangiographic images from the C-arm fluoroscopic device were submitted for interpretation post-operatively. Please see the procedural report for the amount of contrast and the fluoroscopy time utilized.  COMPARISON:  MRCP 03/30/2014  FINDINGS: Multiple view intraoperative cholangiogram submitted. There is contrast opacification of the  cystic duct and CBD, contrast is noted within intrahepatic ducts. No contrast extravasation is noted. Significant dilatation of CBD. Filling defects are noted in distal CBD just above and ampulla level consistent with CBD stones. Small amount of contrast material is passing around the stones in the duodenum.  IMPRESSION: Significant dilatation of CBD. The patient is status postcholecystectomy. Contrast opacification of cystic duct and intrahepatic ducts. No intrahepatic ductal dilatation. Filling defects are noted in distal CBD/ampulla level. Only small amount of contrast is passing into the duodenum.  I reviewed the images with Dr. Sherren Mocha   Electronically Signed   By: Lahoma Crocker M.D.   On: 04/01/2014 13:28   Dg Ercp  04/02/2014   CLINICAL DATA:  Retained common duct stones suggested on intraoperative cholangiogram.  EXAM: ERCP  TECHNIQUE: Multiple spot images obtained with the fluoroscopic device and submitted for interpretation post-procedure.  COMPARISON:  04/01/2014  FINDINGS: Series of 5 fluoroscopic spot images document endoscopic cannulation and opacification of the common bile duct which is mildly ectatic. No persistent filling defects on these limited images. The intrahepatic ducts are incompletely opacified, appearing  decompressed centrally. Surgical clips in the gallbladder fossa. No evidence of leak. Images document balloon sweep of the distal CBD.  IMPRESSION: 1. No definite filling defects or leak.  These images were submitted for radiologic interpretation only. Please see the procedural report for the amount of contrast and the fluoroscopy time utilized.   Electronically Signed   By: Arne Cleveland M.D.   On: 04/02/2014 09:56    Scheduled Meds: . citalopram  40 mg Oral QHS  . cyclobenzaprine  20 mg Oral QHS  . docusate sodium  50 mg Oral Daily  . fentaNYL      . heparin subcutaneous  5,000 Units Subcutaneous Q8H  . HYDROcodone-acetaminophen  2 tablet Oral Q8H  . levothyroxine  100 mcg  Oral QAC breakfast  . pantoprazole  40 mg Oral Daily  . sodium chloride  3 mL Intravenous Q12H  . topiramate  200 mg Oral q morning - 10a   Continuous Infusions: . 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 If 7PM-7AM, please contact night-coverage at [email or web address], password [facility] 04/02/2014, 6:05 PM  LOS: 4 days

## 2014-04-02 NOTE — Progress Notes (Signed)
Day of Surgery  Subjective: Doing well after ERCP.  Looks great, sites are fine, no complaints this AM.  Objective: Vital signs in last 24 hours: Temp:  [97.8 F (36.6 C)-100.7 F (38.2 C)] 98.6 F (37 C) (12/18 1052) Pulse Rate:  [59-103] 85 (12/18 1052) Resp:  [12-22] 12 (12/18 1052) BP: (90-145)/(56-79) 103/58 mmHg (12/18 1052) SpO2:  [93 %-100 %] 93 % (12/18 1052) FiO2 (%):  [40 %] 40 % (12/18 0832) Last BM Date: 03/26/14 No diet order. TM99.4 VSS  Intake/Output from previous day: 12/17 0701 - 12/18 0700 In: 1930 [I.V.:1930] Out: 0  Intake/Output this shift: Total I/O In: 600 [I.V.:600] Out: -   General appearance: alert, cooperative and no distress GI: soft sore, sites look fine.  Lab Results:   Recent Labs  04/01/14 0550 04/02/14 0507  WBC 4.6 14.1*  HGB 9.8* 10.3*  HCT 31.3* 32.4*  PLT 320 384    BMET  Recent Labs  04/01/14 0550 04/02/14 0507  NA 141 141  K 4.0 3.7  CL 106 103  CO2 24 25  GLUCOSE 111* 110*  BUN 7 7  CREATININE 0.89 0.94  CALCIUM 8.7 9.2   PT/INR  Recent Labs  04/01/14 0550  LABPROT 13.9  INR 1.06     Recent Labs Lab 03/29/14 1021 03/30/14 0128 03/31/14 0438 04/01/14 0550 04/02/14 0507  AST 40* 36 25 18 60*  ALT 116* 97* 75* 50* 98*  ALKPHOS 217* 189* 176* 142* 216*  BILITOT 0.9 0.8 0.8 0.5 0.8  PROT 7.3 6.5 6.5 5.8* 6.5  ALBUMIN 3.7 3.3* 3.5 3.1* 3.5     Lipase     Component Value Date/Time   LIPASE 58 04/01/2014 0550     Studies/Results: Dg Cholangiogram Operative  04/01/2014   CLINICAL DATA:  Intraoperative cholangiogram  EXAM: INTRAOPERATIVE CHOLANGIOGRAM  TECHNIQUE: Cholangiographic images from the C-arm fluoroscopic device were submitted for interpretation post-operatively. Please see the procedural report for the amount of contrast and the fluoroscopy time utilized.  COMPARISON:  MRCP 03/30/2014  FINDINGS: Multiple view intraoperative cholangiogram submitted. There is contrast opacification of  the cystic duct and CBD, contrast is noted within intrahepatic ducts. No contrast extravasation is noted. Significant dilatation of CBD. Filling defects are noted in distal CBD just above and ampulla level consistent with CBD stones. Small amount of contrast material is passing around the stones in the duodenum.  IMPRESSION: Significant dilatation of CBD. The patient is status postcholecystectomy. Contrast opacification of cystic duct and intrahepatic ducts. No intrahepatic ductal dilatation. Filling defects are noted in distal CBD/ampulla level. Only small amount of contrast is passing into the duodenum.  I reviewed the images with Dr. Sherren Mocha   Electronically Signed   By: Lahoma Crocker M.D.   On: 04/01/2014 13:28   Dg Ercp  04/02/2014   CLINICAL DATA:  Retained common duct stones suggested on intraoperative cholangiogram.  EXAM: ERCP  TECHNIQUE: Multiple spot images obtained with the fluoroscopic device and submitted for interpretation post-procedure.  COMPARISON:  04/01/2014  FINDINGS: Series of 5 fluoroscopic spot images document endoscopic cannulation and opacification of the common bile duct which is mildly ectatic. No persistent filling defects on these limited images. The intrahepatic ducts are incompletely opacified, appearing decompressed centrally. Surgical clips in the gallbladder fossa. No evidence of leak. Images document balloon sweep of the distal CBD.  IMPRESSION: 1. No definite filling defects or leak.  These images were submitted for radiologic interpretation only. Please see the procedural report for the amount of  contrast and the fluoroscopy time utilized.   Electronically Signed   By: Arne Cleveland M.D.   On: 04/02/2014 09:56    Medications: . citalopram  40 mg Oral QHS  . cyclobenzaprine  20 mg Oral QHS  . docusate sodium  50 mg Oral Daily  . fentaNYL      . heparin subcutaneous  5,000 Units Subcutaneous Q8H  . levothyroxine  100 mcg Oral QAC breakfast  . pantoprazole  40 mg Oral  Daily  . sodium chloride  3 mL Intravenous Q12H  . topiramate  200 mg Oral q morning - 10a    Assessment/Plan 1.  Cholelithiasis, choledocholithiasis, chronic cholecystitis       Laparoscopic cholecystectomy with cholangiogram.04/01/14, Dr. Zella Richer         ERCP with sphincterotomy/papillotomy, 04/02/14 Dr. Owens Loffler   2. Migraines 3-4 per month 3. Chronic pain/Fibromyalgia followed by Dr. Alger Simons. 4. Depression/anxiety disorder, since 01/1981 5. IBS with chronic constipation 6. Hx of tobacco use 7. Hx of MVP 8. Cervical cancer 01/1981 9. Hypothyroid on supplement   Plan:  Home when OK with GI follow up in our clinic, info in AVS.  i will advance her diet as tolerated to low fat by the AM.  Labs in Am.     LOS: 4 days    Sarea Fyfe 04/02/2014

## 2014-04-03 LAB — COMPREHENSIVE METABOLIC PANEL
ALBUMIN: 2.9 g/dL — AB (ref 3.5–5.2)
ALK PHOS: 162 U/L — AB (ref 39–117)
ALT: 65 U/L — AB (ref 0–35)
AST: 28 U/L (ref 0–37)
Anion gap: 9 (ref 5–15)
BILIRUBIN TOTAL: 0.5 mg/dL (ref 0.3–1.2)
BUN: 5 mg/dL — ABNORMAL LOW (ref 6–23)
CO2: 26 meq/L (ref 19–32)
Calcium: 8.4 mg/dL (ref 8.4–10.5)
Chloride: 107 mEq/L (ref 96–112)
Creatinine, Ser: 0.86 mg/dL (ref 0.50–1.10)
GFR calc Af Amer: 83 mL/min — ABNORMAL LOW (ref >90)
GFR calc non Af Amer: 72 mL/min — ABNORMAL LOW (ref >90)
Glucose, Bld: 120 mg/dL — ABNORMAL HIGH (ref 70–99)
POTASSIUM: 3.5 meq/L — AB (ref 3.7–5.3)
SODIUM: 142 meq/L (ref 137–147)
Total Protein: 5.7 g/dL — ABNORMAL LOW (ref 6.0–8.3)

## 2014-04-03 LAB — CBC
HCT: 27.8 % — ABNORMAL LOW (ref 36.0–46.0)
Hemoglobin: 8.8 g/dL — ABNORMAL LOW (ref 12.0–15.0)
MCH: 28.1 pg (ref 26.0–34.0)
MCHC: 31.7 g/dL (ref 30.0–36.0)
MCV: 88.8 fL (ref 78.0–100.0)
PLATELETS: 299 10*3/uL (ref 150–400)
RBC: 3.13 MIL/uL — ABNORMAL LOW (ref 3.87–5.11)
RDW: 14.4 % (ref 11.5–15.5)
WBC: 8.6 10*3/uL (ref 4.0–10.5)

## 2014-04-03 LAB — LIPASE, BLOOD: Lipase: 23 U/L (ref 11–59)

## 2014-04-03 MED ORDER — POTASSIUM CHLORIDE CRYS ER 20 MEQ PO TBCR
40.0000 meq | EXTENDED_RELEASE_TABLET | Freq: Once | ORAL | Status: AC
  Administered 2014-04-03: 40 meq via ORAL
  Filled 2014-04-03: qty 2

## 2014-04-03 NOTE — Discharge Instructions (Signed)
[facility] ______[facility] LAPAROSCOPIC SURGERY: POST OP INSTRUCTIONS Always review your discharge instruction sheet given to you by the facility where your surgery was performed. IF YOU HAVE DISABILITY OR FAMILY LEAVE FORMS, YOU MUST BRING THEM TO THE OFFICE FOR PROCESSING.   DO NOT GIVE THEM TO YOUR DOCTOR.  1. A prescription for pain medication may be given to you upon discharge.  Take your pain medication as prescribed, if needed.  If narcotic pain medicine is not needed, then you may take acetaminophen (Tylenol) or ibuprofen (Advil) as needed. 2. Take your usually prescribed medications unless otherwise directed. 3. If you need a refill on your pain medication, please contact your pharmacy.  They will contact our office to request authorization. Prescriptions will not be filled after 5pm or on week-ends. 4. You should follow a light diet the first few days after arrival home, such as soup and crackers, etc.  Be sure to include lots of fluids daily. 5. Most patients will experience some swelling and bruising in the area of the incisions.  Ice packs will help.  Swelling and bruising can take several days to resolve.  6. It is common to experience some constipation if taking pain medication after surgery.  Increasing fluid intake and taking a stool softener (such as Colace) will usually help or prevent this problem from occurring.  A mild laxative (Milk of Magnesia or Miralax) should be taken according to package instructions if there are no bowel movements after 48 hours. 7. Unless discharge instructions indicate otherwise, you may remove your bandages 24-48 hours after surgery, and you may shower at that time.  You may have steri-strips (small skin tapes) in place directly over the incision.  These strips should be left on the skin for 7-10 days.  If your surgeon used skin glue on the incision, you may shower in 24 hours.  The glue will flake off over the next 2-3 weeks.  Any sutures or  staples will be removed at the office during your follow-up visit. 8. ACTIVITIES:  You may resume regular light daily activities beginning the next day--such as daily self-care, walking, climbing stairs--gradually increasing activities as tolerated.  You may have sexual intercourse when it is comfortable.  Refrain from any heavy lifting or straining-nothing over 10 pounds for 2 weeks.  a. You may drive when you are no longer taking prescription pain medication, you can comfortably wear a seatbelt, and you can safely maneuver your car and apply brakes. b. RETURN TO WORK:  __________________________________________________________ 9. You should see your doctor in the office for a follow-up appointment approximately 2-3 weeks after your surgery.  Make sure that you call for this appointment within a day or two after you arrive home to insure a convenient appointment time. 10. OTHER INSTRUCTIONS: __________________________________________________________________________________________________________________________ __________________________________________________________________________________________________________________________ WHEN TO CALL YOUR DOCTOR: 1. Fever over 101.0 2. Inability to urinate 3. Continued bleeding from incision. 4. Increased pain, redness, or drainage from the incision. 5. Increasing abdominal pain  The clinic staff is available to answer your questions during regular business hours.  Please dont hesitate to call and ask to speak to one of the nurses for clinical concerns.  If you have a medical emergency, go to the nearest emergency room or call 911.  A surgeon from [facility] is always on call at the hospital. [address] [place], [place], [place]  [address] ? [address], [place],    [address] [phone] ? [phone] ? FAX [phone]  Web site: [email or web address]  Laparoscopic Cholecystectomy, Care After Refer to  this sheet in the next few weeks. These instructions provide you with information on caring for yourself after your procedure. Your health care provider may also give you more specific instructions. Your treatment has been planned according to current medical practices, but problems sometimes occur. Call your health care provider if you have any problems or questions after your procedure. WHAT TO EXPECT AFTER THE PROCEDURE After your procedure, it is typical to have the following:  Pain at your incision sites. You will be given pain medicines to control the pain.  Mild nausea or vomiting. This should improve after the first 24 hours.  Bloating and possibly shoulder pain from the gas used during the procedure. This will improve after the first 24 hours. HOME CARE INSTRUCTIONS   Change bandages (dressings) as directed by your health care provider.  Keep the wound dry and clean. You may wash the wound gently with soap and water. Gently blot or dab the area dry.  Do not take baths or use swimming pools or hot tubs for 2 weeks or until your health care provider approves.  Only take over-the-counter or prescription medicines as directed by your health care provider.  Continue your normal diet as directed by your health care provider.  Do not lift anything heavier than 10 pounds (4.5 kg) until your health care provider approves.  Do not play contact sports for 1 week or until your health care provider approves. SEEK MEDICAL CARE IF:   You have redness, swelling, or increasing pain in the wound.  You notice yellowish-white fluid (pus) coming from the wound.  You have drainage from the wound that lasts longer than 1 day.  You notice a bad smell coming from the wound or dressing.  Your surgical cuts (incisions) break open. SEEK IMMEDIATE MEDICAL CARE IF:   You develop a rash.  You have difficulty breathing.  You have chest pain.  You have a fever.  You have increasing pain in the  shoulders (shoulder strap areas).  You have dizzy episodes or faint while standing.  You have severe abdominal pain.  You feel sick to your stomach (nauseous) or throw up (vomit) and this lasts for more than 1 day. Document Released: 04/02/2005 Document Revised: 01/21/2013 Document Reviewed: 11/12/2012 [facility] Patient Information 2015 [place], [place]. This information is not intended to replace advice given to you by your health care provider. Make sure you discuss any questions you have with your health care provider.  Marland KitchenercpEndoscopic Retrograde Cholangiopancreatography (ERCP) Endoscopic retrograde cholangiopancreatography (ERCP) is a procedure used to diagnosis many diseases of the pancreas, bile ducts, liver, and gallbladder. During ERCP a thin, lighted tube (endoscope) is passed through the mouth and down the back of the throat into the first part of the small intestine (duodenum). A small, plastic tube (cannula) is then passed through the endoscope and directed into the bile duct or pancreatic duct. Dye is then injected through the cannula and X-rays are taken to study the biliary and pancreatic passageways.  LET [facility] CARE PROVIDER KNOW ABOUT:   Any allergies you have.   All medicines you are taking, including vitamins, herbs, eyedrops, creams, and over-the-counter medicines.   Previous problems you or members of your family have had with the use of anesthetics.   Any blood disorders you have.   Previous surgeries you have had.   Medical conditions you have. RISKS AND COMPLICATIONS Generally, ERCP is a safe procedure. However, as  with any procedure, complications can occur. A simple removal of gallstones has the lowest rate of complications. Higher rates of complication occur in people who have poorly functioning bile or pancreatic ducts. Possible complications include:   Pancreatitis.  Bleeding.  Accidental punctures in the bowel wall, pancreas, or gall  bladder.  Gall bladder or bile duct infection. BEFORE THE PROCEDURE   Do not eat or drink anything, including water, for at least 8 hours before the procedure or as directed by your health care provider.   Ask your health care provider whether you should stop taking certain medicines prior to your procedure.   Arrange for someone to drive you home. You will not be allowed to drive for 66-06 hours after the procedure. PROCEDURE   You will be given medicine through a vein (intravenously) to make you relaxed and sleepy.   You might have a breathing tube placed to give you medicine that makes you sleep (general anesthetic).   Your throat may be sprayed with medicine that numbs the area and prevents gagging (local anesthetic), or you may gargle this medicine.   You will lie on your left side.   The endoscope will be inserted through your mouth and into the duodenum. The tube will not interfere with your breathing. Gagging is prevented by the anesthesia.   While X-rays are being taken, you may be positioned on your stomach.   A small sample of tissue (biopsy) may be removed for examination. AFTER THE PROCEDURE   You will rest in bed until you are fully conscious.   When you first wake up, your throat may feel slightly sore.   You will not be allowed to eat or drink until numbness subsides.   Once you are able to drink, urinate, and sit on the edge of the bed without feeling sick to your stomach (nauseous) or dizzy, you may be allowed to go home. Document Released: 12/26/2000 Document Revised: 01/21/2013 Document Reviewed: 11/11/2012 [facility] Patient Information 2015 [place], [place]. This information is not intended to replace advice given to you by your health care provider. Make sure you discuss any questions you have with your health care provider.

## 2014-04-03 NOTE — Progress Notes (Signed)
1 Day Post-Op  Subjective: Sore around incisions.  Wants to go home.  Objective: Vital signs in last 24 hours: Temp:  [98.2 F (36.8 C)-100.3 F (37.9 C)] 99 F (37.2 C) (12/19 0526) Pulse Rate:  [79-104] 79 (12/19 0526) Resp:  [12-18] 16 (12/19 0526) BP: (92-145)/(56-81) 100/60 mmHg (12/19 0526) SpO2:  [93 %-98 %] 94 % (12/19 0526) Last BM Date: 03/26/14  Intake/Output from previous day: 12/18 0701 - 12/19 0700 In: 1320 [P.O.:720; I.V.:600] Out: -  Intake/Output this shift:    PE: General- In NAD Abdomen-soft, some bruising around incisions.  Lab Results:   Recent Labs  04/02/14 0507 04/03/14 0527  WBC 14.1* 8.6  HGB 10.3* 8.8*  HCT 32.4* 27.8*  PLT 384 299   BMET  Recent Labs  04/02/14 0507 04/03/14 0527  NA 141 142  K 3.7 3.5*  CL 103 107  CO2 25 26  GLUCOSE 110* 120*  BUN 7 5*  CREATININE 0.94 0.86  CALCIUM 9.2 8.4   PT/INR  Recent Labs  04/01/14 0550  LABPROT 13.9  INR 1.06   Comprehensive Metabolic Panel:    Component Value Date/Time   NA 142 04/03/2014 0527   NA 141 04/02/2014 0507   K 3.5* 04/03/2014 0527   K 3.7 04/02/2014 0507   CL 107 04/03/2014 0527   CL 103 04/02/2014 0507   CO2 26 04/03/2014 0527   CO2 25 04/02/2014 0507   BUN 5* 04/03/2014 0527   BUN 7 04/02/2014 0507   CREATININE 0.86 04/03/2014 0527   CREATININE 0.94 04/02/2014 0507   CREATININE 1.03 12/16/2012 1222   GLUCOSE 120* 04/03/2014 0527   GLUCOSE 110* 04/02/2014 0507   CALCIUM 8.4 04/03/2014 0527   CALCIUM 9.2 04/02/2014 0507   AST 28 04/03/2014 0527   AST 60* 04/02/2014 0507   ALT 65* 04/03/2014 0527   ALT 98* 04/02/2014 0507   ALKPHOS 162* 04/03/2014 0527   ALKPHOS 216* 04/02/2014 0507   BILITOT 0.5 04/03/2014 0527   BILITOT 0.8 04/02/2014 0507   PROT 5.7* 04/03/2014 0527   PROT 6.5 04/02/2014 0507   ALBUMIN 2.9* 04/03/2014 0527   ALBUMIN 3.5 04/02/2014 0507     Studies/Results: Dg Cholangiogram Operative  04/01/2014   CLINICAL DATA:   Intraoperative cholangiogram  EXAM: INTRAOPERATIVE CHOLANGIOGRAM  TECHNIQUE: Cholangiographic images from the C-arm fluoroscopic device were submitted for interpretation post-operatively. Please see the procedural report for the amount of contrast and the fluoroscopy time utilized.  COMPARISON:  MRCP 03/30/2014  FINDINGS: Multiple view intraoperative cholangiogram submitted. There is contrast opacification of the cystic duct and CBD, contrast is noted within intrahepatic ducts. No contrast extravasation is noted. Significant dilatation of CBD. Filling defects are noted in distal CBD just above and ampulla level consistent with CBD stones. Small amount of contrast material is passing around the stones in the duodenum.  IMPRESSION: Significant dilatation of CBD. The patient is status postcholecystectomy. Contrast opacification of cystic duct and intrahepatic ducts. No intrahepatic ductal dilatation. Filling defects are noted in distal CBD/ampulla level. Only small amount of contrast is passing into the duodenum.  I reviewed the images with Dr. Sherren Mocha   Electronically Signed   By: Lahoma Crocker M.D.   On: 04/01/2014 13:28   Dg Ercp  04/02/2014   CLINICAL DATA:  Retained common duct stones suggested on intraoperative cholangiogram.  EXAM: ERCP  TECHNIQUE: Multiple spot images obtained with the fluoroscopic device and submitted for interpretation post-procedure.  COMPARISON:  04/01/2014  FINDINGS: Series of 5 fluoroscopic  spot images document endoscopic cannulation and opacification of the common bile duct which is mildly ectatic. No persistent filling defects on these limited images. The intrahepatic ducts are incompletely opacified, appearing decompressed centrally. Surgical clips in the gallbladder fossa. No evidence of leak. Images document balloon sweep of the distal CBD.  IMPRESSION: 1. No definite filling defects or leak.  These images were submitted for radiologic interpretation only. Please see the procedural  report for the amount of contrast and the fluoroscopy time utilized.   Electronically Signed   By: Arne Cleveland M.D.   On: 04/02/2014 09:56    Anti-infectives: Anti-infectives    Start     Dose/Rate Route Frequency Ordered Stop   04/02/14 0930  ciprofloxacin (CIPRO) IVPB 400 mg     400 mg200 mL/hr over 60 Minutes Intravenous  Once 04/02/14 0910 04/02/14 0950   04/01/14 1412  ceFAZolin (ANCEF) IVPB 2 g/50 mL premix  Status:  Discontinued     2 g100 mL/hr over 30 Minutes Intravenous 60 min pre-op 04/01/14 1413 04/01/14 1441   04/01/14 1230  ceFAZolin (ANCEF) IVPB 1 g/50 mL premix     1 g100 mL/hr over 30 Minutes Intravenous  Once 04/01/14 1215 04/01/14 1215   04/01/14 1215  ceFAZolin (ANCEF) IVPB 1 g/50 mL premix  Status:  Discontinued     1 g100 mL/hr over 30 Minutes Intravenous  Once 04/01/14 1210 04/01/14 1215   04/01/14 1200  ceFAZolin (ANCEF) IVPB 2 g/50 mL premix  Status:  Discontinued     2 g100 mL/hr over 30 Minutes Intravenous  Once 04/01/14 1145 04/01/14 1211      Assessment Principal Problem:   Cholelithiasis with chronic cholecystitis with transient biliary obstruction s/p lap chole and ERCP-doing well.     LOS: 5 days   Plan: Okay for discharge from my standpoint.  Instructions given to her.   Candence Sease J 04/03/2014

## 2014-04-03 NOTE — Discharge Summary (Signed)
Physician Discharge Summary  Jenna Molina NFA:[id number] DOB: 1955-12-22 DOA: 03/29/2014  PCP: Tawanna Solo, MD  Admit date: 03/29/2014 Discharge date: 04/03/2014  Time spent: 65 minutes  Recommendations for Outpatient Follow-up:  1. Follow up with Tawanna Solo, MD in 1 week.  2. Follow-up with general surgery in a few weeks.  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  Diet recommendation: Regular  Filed Weights   03/29/14 1700  Weight: 57.3 kg (126 lb 5.2 oz)    History of present illness:  58 year old female with a history of cervical cancer status post total abdominal hysterectomy, recent EGD/colonoscopy at [facility] in [place], ECG showing gastritis, colonoscopy to showing some random polyps, who presented to the ER with abdominal pain. Patient takes Vicodin on regular basis and states that her ALT level was mildly elevated last week. The patient had an MRCP done on Friday last week ordered by Dr. Rex Kras, patient's primary care provider. Results were called into the PCP office by [facility]. Patient complained of pain in the right upper quadrant and the epigastric region. Ultrasound obtained shows cholelithiasis without evidence of cholecystitis, dilatation of the common bile duct.  Alkaline phosphatase 217 lipase 100 AST 40 ALT 116, normal bilirubin, UA negative Patient refuses to [facility] : Gastroenterology and requesting to be seen by [facility] Course:  #1 cholelithiasis/gallbladder wall thickening/, bile duct dilatation with postprandial pain Patient was admitted with right upper  quadrant and epigastric pain which also occurred postprandially. Ultrasound of the abdomen which was done showed cholelithiasis without evidence of cholecystitis, dilatation of the common bile duct. GI consultation was obtained and MRCP was recommended. MRCP confirming biliary duct dilatation with without obvious bile duct stones. General Surgical Consultation was obtained. Patient has been seen by general surgery and patient underwent lap cholecystectomy with IOC. Due to abnormal IOC patient subsequently underwent ERCP with sphincterectomy/papillotomy per GI. Patient with significant clinical improvement. Patient will be discharged in stable and improved condition.   #2 history of migraine headaches Continued on home regimen of Imitrex as needed.  #3 hypothyroidism Continued on home dose Synthroid.  #4 depression Continued on home regimen of Celexa.  #5 fibromyalgia Stable throughout the hospitalization.  Procedures:  MRCP 03/30/2014  Abdominal ultrasound 03/29/2014  Lap cholecystectomy 04/01/14. Dr Zella Richer  ERCP with sphincterectomy/papillotomy Dr. Ardis Hughs 04/02/2014    Consultations:  Gastroenterology: Dr. Ardis Hughs 03/29/2014  Gen. surgery: Dr. Zella Richer 03/31/2014  Discharge Exam: Filed Vitals:   04/03/14 1214  BP: 107/64  Pulse:   Temp:   Resp:     General: NAD Cardiovascular: RRR Respiratory: CTAB  Discharge Instructions You were cared for by a hospitalist during your hospital stay. If you have any questions about your discharge medications or the care you received while you were in the hospital after you are discharged, you can call the unit and asked to speak with the hospitalist on call if the hospitalist that took care of you is not available. Once you are discharged, your primary care physician will handle any further medical issues. Please note that NO REFILLS for any discharge medications will be authorized once you are discharged, as it is imperative that  you return to your primary care physician (or establish a relationship with a primary care physician if you do not have one) for your aftercare needs so that they can reassess your need for medications and monitor your lab values.  Discharge Instructions    Diet general    Complete by:  As directed      Discharge instructions    Complete by:  As directed   Follow up with Tawanna Solo, MD in 1 week. Follow up with Dr Zella Richer, General Surgery in few weeks.     Increase activity slowly    Complete by:  As directed           Current Discharge Medication List    CONTINUE these medications which have NOT CHANGED   Details  Acetaminophen 500 MG coapsule Take 250-500 mg by mouth 3 (three) times daily. Takes 250mg  at 0030 and 0800 and 500mg  at bedtime (1430)    cholecalciferol (VITAMIN D) 1000 UNITS tablet Take 5,000 Units by mouth every morning.    citalopram (CELEXA) 40 MG tablet Take 40 mg by mouth at bedtime.     cyclobenzaprine (FLEXERIL) 10 MG tablet Take 2 tablets (20 mg total) by mouth at bedtime. Qty: 60 tablet, Refills: 4    diphenhydrAMINE (BENADRYL) 25 mg capsule Take 25 mg by mouth every 12 (twelve) hours as needed for itching.     Docusate Calcium (STOOL SOFTENER PO) Take 50 mg by mouth at bedtime as needed.    esomeprazole (NEXIUM) 40 MG capsule Take 80 mg by mouth daily before breakfast.     HYDROcodone-acetaminophen (NORCO) 10-325 MG per tablet Take 2 tablets by mouth every 8 (eight) hours. Qty: 180 tablet, Refills: 0   Associated Diagnoses: Migraine without aura and with status migrainosus, not intractable; Myalgia and myositis    levothyroxine (SYNTHROID, LEVOTHROID) 100 MCG tablet Take 100 mcg by mouth every morning.    LORazepam (ATIVAN) 1 MG tablet Take 1 mg by mouth every 6 (six) hours as needed. For anxiety    Polyethyl Glycol-Propyl Glycol (SYSTANE OP) Place 2 drops into both eyes daily as needed (dryness).    promethazine (PHENERGAN) 25 MG tablet  Take 25 mg by mouth every 6 (six) hours as needed. For nausea    SUMAtriptan (IMITREX) 100 MG tablet Take 100 mg by mouth every 2 (two) hours as needed. For migraine    topiramate (TOPAMAX) 200 MG tablet Take 200 mg by mouth every morning.    vitamin E 400 UNIT capsule Take 400 Units by mouth every morning.    ALPRAZolam (XANAX) 0.25 MG tablet Take 1 tablet (0.25 mg total) by mouth every 8 (eight) hours as needed for anxiety. Qty: 30 tablet, Refills: 0       Allergies  Allergen Reactions  . Aspirin Nausea Only  . Lyrica [Pregabalin]     Involuntary movement  . Sulfa Antibiotics Hives  . Adhesive [Tape] Rash  . Neosporin [Neomycin-Bacitracin Zn-Polymyx] Rash   Follow-up Information    Follow up with [facility] On 04/20/2014.   Why:  Your appointment is at 3 PM, be there 30 minutes early for check in.   Contact information:   [address] [place]   [place] [address] [phone]       Follow up with Tawanna Solo, MD. Schedule an appointment as soon as possible for a visit in 1 week.   Specialty:  Family Medicine   Contact information:   [address]  [place] [address] [phone]        The results of significant diagnostics from this hospitalization (including imaging, microbiology, ancillary and laboratory) are listed below for reference.    Significant Diagnostic Studies: Dg Cholangiogram Operative  04/01/2014   CLINICAL DATA:  Intraoperative cholangiogram  EXAM: INTRAOPERATIVE CHOLANGIOGRAM  TECHNIQUE: Cholangiographic images from the C-arm fluoroscopic device were submitted for interpretation post-operatively. Please see the procedural report for the amount of contrast and the fluoroscopy time utilized.  COMPARISON:  MRCP 03/30/2014  FINDINGS: Multiple view intraoperative cholangiogram submitted. There is contrast opacification of the cystic duct and CBD, contrast is noted within intrahepatic ducts. No contrast extravasation is noted.  Significant dilatation of CBD. Filling defects are noted in distal CBD just above and ampulla level consistent with CBD stones. Small amount of contrast material is passing around the stones in the duodenum.  IMPRESSION: Significant dilatation of CBD. The patient is status postcholecystectomy. Contrast opacification of cystic duct and intrahepatic ducts. No intrahepatic ductal dilatation. Filling defects are noted in distal CBD/ampulla level. Only small amount of contrast is passing into the duodenum.  I reviewed the images with Dr. Sherren Mocha   Electronically Signed   By: Lahoma Crocker M.D.   On: 04/01/2014 13:28   US Abdomen Complete  03/29/2014   CLINICAL DATA:  Pain.  EXAM: ULTRASOUND ABDOMEN COMPLETE  COMPARISON:  03/26/2014.  FINDINGS: Gallbladder: Previously identified 5 mm nodular density is now mobile and most likely represents focal area of mobile sludge and/or gallstone. Gallbladder wall thickening has resolved. Gallbladder wall thickness is now 2 mm which is normal. Negative Murphy sign. No pericholecystic fluid collection.  Common bile duct: Diameter: 14 mm.  Liver: Liver echodense consistent with fatty infiltration. Focal fatty sparing again noted adjacent to the gallbladder.  IVC: No abnormality visualized.  Pancreas: Visualized portion unremarkable.  Spleen: Size and appearance within normal limits.  Right Kidney: Length: 10.0 cm. Echogenicity within normal limits. No mass or hydronephrosis visualized.  Left Kidney: Length: 10.2 cm. Echogenicity within normal limits. No mass or hydronephrosis visualized.  Abdominal aorta: No aneurysm visualized.  Other findings: None.  IMPRESSION: 1. Progressive distention of the common bile duct. Common bile duct now measures up to 14 mm. MRCP should be considered for further evaluation. 2. Previously identified 5 mm non mobile density in the gallbladder is now mobile and most likely represent focal area of tumefactive sludge and/or gallstones. Gallbladder wall  thickening has resolved. Negative Murphy sign. No pericholecystic fluid collection. 3. Fatty infiltration of the liver with focal fatty sparing adjacent to the gallbladder.   Electronically Signed   By: Marcello Moores  Register   On: 03/29/2014 11:40   Mr 3d Recon At Scanner  03/31/2014   CLINICAL DATA:  58 year old female with history of abdominal pain and weight loss, found to have an enlarged common bile duct by ultrasound.  EXAM: MRI ABDOMEN WITHOUT AND WITH CONTRAST (INCLUDING MRCP)  TECHNIQUE: Multiplanar multisequence MR imaging of the abdomen was performed both before and after the administration of intravenous contrast. Heavily T2-weighted images of the biliary and pancreatic ducts were obtained, and three-dimensional MRCP images were rendered by post processing.  CONTRAST:  55mL MULTIHANCE GADOBENATE DIMEGLUMINE 529 MG/ML IV SOLN  COMPARISON:  Abdominal ultrasound 03/29/2014.  FINDINGS: Study is slightly limited by a large amount of patient respiratory motion.  Relatively diffuse loss of signal intensity throughout the hepatic parenchyma on out of phase dual echo images, compatible with hepatic steatosis. Scattered areas of focal fatty sparing  are noted, most evident adjacent to the gallbladder fossa. In segment 2 of the liver there is a 1 cm lesion (image 22 of series 13) which is low signal intensity on T1 weighted images, high signal intensity on T2 weighted images, and does not enhance, favored to be a biliary hamartoma or small cyst. No other cystic or solid hepatic lesions are identified. Mild intrahepatic biliary ductal dilatation is noted. Gallbladder appears moderately distended, but is otherwise normal in appearance. There is a 5 mm filling defect in the gallbladder fundus, compatible with a small stone or biliary sludge ball. The common hepatic duct and common bile duct are both dilated, measuring up to 11 mm and 12 mm in diameter respectively. MRCP images demonstrate no definite filling defect  within the common hepatic or common bile duct. Thin slab T2 weighted images demonstrate potential small filling defects in the distal aspect of the common bile duct on images 33 and 34 of series 13, however, it is uncertain whether or not these are real or simply flow related artifacts. Respiratory motion severely limits assessment of the distal aspect of the common bile duct, and the head of the pancreas. With these limitations in mind no discrete pancreatic head mass is confidently identified on today's study. Additionally, there is no pancreatic ductal dilatation. There is a poorly depicted region of low T1 signal intensity, high T2 signal intensity and no enhancement immediately posterior and lateral to the distal common bile duct near the ampulla, which is favored to represent a duodenal diverticulum from the second or third portion of the duodenum. There is a similar finding (although this is predominantly low T2 signal intensity, likely because of internal gas) anteriorly to the distal common bile duct as well, which may be part of the same diverticulum, or a second more anteriorly located diverticulum, best appreciated on image 62 of series 1503.  Tiny sub cm lesions in the spleen are low T1 signal intensity, high T2 signal intensity, and do not enhance, likely small cysts. The appearance of the adrenal glands is normal bilaterally. There are subcentimeter lesions in the upper poles of the kidneys bilaterally which are low T1 signal intensity, high T2 signal intensity, and do not enhance, compatible with tiny simple cysts. Visualized portions of the lower thorax are unremarkable.  IMPRESSION: 1. Limited study secondary extensive respiratory motion. The findings are unusual in that there is dilatation of the common bile duct and common hepatic ducts, but there is no intrahepatic biliary ductal dilatation. Although there are potential small filling defects in the distal common bile duct shortly before the  ampulla noted on axial T2 weighted images, these may simply be flow related artifact, and these findings are not confirmed on additional imaging planes or additional pulse sequences. The possibility of distal ductal stones is not excluded, but is not strongly favored. Other differential considerations include a distal ductal stricture (however, any obstructing lesion would be expected to be associated with some degree of intrahepatic biliary ductal dilatation, which is not present), or potentially a type 1 choledochal cyst. Correlation with ERCP is recommended if there are clinical signs and symptoms of biliary tract obstruction. 2. Collections of gas and fluid adjacent to the distal common bile duct described above are favored to represent one or more duodenal diverticulae from the second and/or third portion of the duodenum. 3. Hepatic steatosis with areas of focal fatty sparing. 4. 5 mm filling defect in the gallbladder fundus compatible with a small gallstone or biliary sludge  ball. No current findings to suggest an acute cholecystitis at this time. 5. Additional incidental findings, as above.   Electronically Signed   By: Vinnie Langton M.D.   On: 03/31/2014 08:42   Dg Ercp  04/02/2014   CLINICAL DATA:  Retained common duct stones suggested on intraoperative cholangiogram.  EXAM: ERCP  TECHNIQUE: Multiple spot images obtained with the fluoroscopic device and submitted for interpretation post-procedure.  COMPARISON:  04/01/2014  FINDINGS: Series of 5 fluoroscopic spot images document endoscopic cannulation and opacification of the common bile duct which is mildly ectatic. No persistent filling defects on these limited images. The intrahepatic ducts are incompletely opacified, appearing decompressed centrally. Surgical clips in the gallbladder fossa. No evidence of leak. Images document balloon sweep of the distal CBD.  IMPRESSION: 1. No definite filling defects or leak.  These images were submitted for  radiologic interpretation only. Please see the procedural report for the amount of contrast and the fluoroscopy time utilized.   Electronically Signed   By: Arne Cleveland M.D.   On: 04/02/2014 09:56   Mr Lambert Mody Cm/mrcp  03/31/2014   CLINICAL DATA:  58 year old female with history of abdominal pain and weight loss, found to have an enlarged common bile duct by ultrasound.  EXAM: MRI ABDOMEN WITHOUT AND WITH CONTRAST (INCLUDING MRCP)  TECHNIQUE: Multiplanar multisequence MR imaging of the abdomen was performed both before and after the administration of intravenous contrast. Heavily T2-weighted images of the biliary and pancreatic ducts were obtained, and three-dimensional MRCP images were rendered by post processing.  CONTRAST:  19mL MULTIHANCE GADOBENATE DIMEGLUMINE 529 MG/ML IV SOLN  COMPARISON:  Abdominal ultrasound 03/29/2014.  FINDINGS: Study is slightly limited by a large amount of patient respiratory motion.  Relatively diffuse loss of signal intensity throughout the hepatic parenchyma on out of phase dual echo images, compatible with hepatic steatosis. Scattered areas of focal fatty sparing are noted, most evident adjacent to the gallbladder fossa. In segment 2 of the liver there is a 1 cm lesion (image 22 of series 13) which is low signal intensity on T1 weighted images, high signal intensity on T2 weighted images, and does not enhance, favored to be a biliary hamartoma or small cyst. No other cystic or solid hepatic lesions are identified. Mild intrahepatic biliary ductal dilatation is noted. Gallbladder appears moderately distended, but is otherwise normal in appearance. There is a 5 mm filling defect in the gallbladder fundus, compatible with a small stone or biliary sludge ball. The common hepatic duct and common bile duct are both dilated, measuring up to 11 mm and 12 mm in diameter respectively. MRCP images demonstrate no definite filling defect within the common hepatic or common bile duct. Thin  slab T2 weighted images demonstrate potential small filling defects in the distal aspect of the common bile duct on images 33 and 34 of series 13, however, it is uncertain whether or not these are real or simply flow related artifacts. Respiratory motion severely limits assessment of the distal aspect of the common bile duct, and the head of the pancreas. With these limitations in mind no discrete pancreatic head mass is confidently identified on today's study. Additionally, there is no pancreatic ductal dilatation. There is a poorly depicted region of low T1 signal intensity, high T2 signal intensity and no enhancement immediately posterior and lateral to the distal common bile duct near the ampulla, which is favored to represent a duodenal diverticulum from the second or third portion of the duodenum. There is a similar  finding (although this is predominantly low T2 signal intensity, likely because of internal gas) anteriorly to the distal common bile duct as well, which may be part of the same diverticulum, or a second more anteriorly located diverticulum, best appreciated on image 62 of series 1503.  Tiny sub cm lesions in the spleen are low T1 signal intensity, high T2 signal intensity, and do not enhance, likely small cysts. The appearance of the adrenal glands is normal bilaterally. There are subcentimeter lesions in the upper poles of the kidneys bilaterally which are low T1 signal intensity, high T2 signal intensity, and do not enhance, compatible with tiny simple cysts. Visualized portions of the lower thorax are unremarkable.  IMPRESSION: 1. Limited study secondary extensive respiratory motion. The findings are unusual in that there is dilatation of the common bile duct and common hepatic ducts, but there is no intrahepatic biliary ductal dilatation. Although there are potential small filling defects in the distal common bile duct shortly before the ampulla noted on axial T2 weighted images, these may  simply be flow related artifact, and these findings are not confirmed on additional imaging planes or additional pulse sequences. The possibility of distal ductal stones is not excluded, but is not strongly favored. Other differential considerations include a distal ductal stricture (however, any obstructing lesion would be expected to be associated with some degree of intrahepatic biliary ductal dilatation, which is not present), or potentially a type 1 choledochal cyst. Correlation with ERCP is recommended if there are clinical signs and symptoms of biliary tract obstruction. 2. Collections of gas and fluid adjacent to the distal common bile duct described above are favored to represent one or more duodenal diverticulae from the second and/or third portion of the duodenum. 3. Hepatic steatosis with areas of focal fatty sparing. 4. 5 mm filling defect in the gallbladder fundus compatible with a small gallstone or biliary sludge ball. No current findings to suggest an acute cholecystitis at this time. 5. Additional incidental findings, as above.   Electronically Signed   By: Vinnie Langton M.D.   On: 03/31/2014 08:42   Mr Outside Films Chest  03/29/2014   This examination belongs to an outside facility and is stored  here for comparison purposes only.  Contact the originating outside  institution for any associated report or interpretation.  US Abdomen Limited Ruq  03/26/2014   CLINICAL DATA:  Acute epigastric pain.  EXAM: US ABDOMEN LIMITED - RIGHT UPPER QUADRANT  COMPARISON:  None.  FINDINGS: Gallbladder:  5.5 mm polyp versus tumefactive sludge. Gallbladder wall thickness is 3.4 mm. This could be seen in hypoproteinemic state and cholecystitis. Negative Murphy sign.  Common bile duct:  Diameter: 10.6 mm.  No focal obstructing abnormality identified.  Liver:  Echogenic liver suggesting fatty infiltration. Changes consistent with focal fatty sparing adjacent to the gallbladder.  IMPRESSION: 1. 5.5 mm  gallbladder polyp versus tumefactive sludge. Gallbladder wall thickness is 3.4 mm. Gallbladder wall thickening can be seen with hypoproteinemic state and cholecystitis. Negative Murphy sign. No pericholecystic fluid collections. 2. Dilated common bile duct to 10.6 mm. No focal obstructing abnormalities identified. MRCP may be useful for further evaluation. 3. Fatty infiltration of the liver with focal fatty sparing adjacent to the gallbladder.   Electronically Signed   By: Marcello Moores  Register   On: 03/26/2014 11:03    Microbiology: Recent Results (from the past 240 hour(s))  Urine culture     Status: None   Collection Time: 03/29/14 11:33 AM  Result Value Ref  Range Status   Specimen Description URINE, RANDOM  Final   Special Requests NONE  Final   Culture  Setup Time   Final    03/29/2014 15:43 Performed at [facility]   Final    35,000 COLONIES/ML Performed at [facility]    Culture   Final    LACTOBACILLUS SPECIES Note: Standardized susceptibility testing for this organism is not available. Performed at [facility]    Report Status 03/30/2014 FINAL  Final  Surgical pcr screen     Status: None   Collection Time: 03/31/14  9:05 PM  Result Value Ref Range Status   MRSA, PCR NEGATIVE NEGATIVE Final   Staphylococcus aureus NEGATIVE NEGATIVE Final    Comment:        The Xpert SA Assay (FDA approved for NASAL specimens in patients over 75 years of age), is one component of a comprehensive surveillance program.  Test performance has been validated by [employer] for patients greater than or equal to 31 year old. It is not intended to diagnose infection nor to guide or monitor treatment.      Labs: Basic Metabolic Panel:  Recent Labs Lab 03/30/14 0128 03/31/14 0438 04/01/14 0550 04/02/14 0507 04/03/14 0527  NA 143 143 141 141 142  K 4.2 3.9 4.0 3.7 3.5*  CL 108 107 106 103 107  CO2 22 26 24 25 26   GLUCOSE 92 102* 111* 110*  120*  BUN 9 6 7 7  5*  CREATININE 0.73 0.80 0.89 0.94 0.86  CALCIUM 8.9 9.2 8.7 9.2 8.4   Liver Function Tests:  Recent Labs Lab 03/30/14 0128 03/31/14 0438 04/01/14 0550 04/02/14 0507 04/03/14 0527  AST 36 25 18 60* 28  ALT 97* 75* 50* 98* 65*  ALKPHOS 189* 176* 142* 216* 162*  BILITOT 0.8 0.8 0.5 0.8 0.5  PROT 6.5 6.5 5.8* 6.5 5.7*  ALBUMIN 3.3* 3.5 3.1* 3.5 2.9*    Recent Labs Lab 03/29/14 1021 03/30/14 0128 04/01/14 0550 04/03/14 0527  LIPASE 100* 97* 58 23  AMYLASE  --  115*  --   --    No results for input(s): AMMONIA in the last 168 hours. CBC:  Recent Labs Lab 03/29/14 1021 03/30/14 0128 03/31/14 0438 04/01/14 0550 04/02/14 0507 04/03/14 0527  WBC 6.8 6.5 6.4 4.6 14.1* 8.6  NEUTROABS 4.0  --   --   --  11.3*  --   HGB 11.7* 11.6* 11.0* 9.8* 10.3* 8.8*  HCT 35.7* 36.1 34.3* 31.3* 32.4* 27.8*  MCV 85.6 87.4 87.5 88.4 87.8 88.8  PLT 298 [id number] [phone]   Cardiac Enzymes:  Recent Labs Lab 03/29/14 1316 03/29/14 1911 03/30/14 0128  TROPONINI <0.30 <0.30 <0.30   BNP: BNP (last 3 results) No results for input(s): PROBNP in the last 8760 hours. CBG: No results for input(s): GLUCAP in the last 168 hours.     SignedIrine Seal MD [facility] 04/03/2014, 2:53 PM

## 2014-04-05 ENCOUNTER — Encounter (HOSPITAL_COMMUNITY): Payer: Self-pay | Admitting: Gastroenterology

## 2014-04-07 ENCOUNTER — Telehealth: Payer: Self-pay | Admitting: *Deleted

## 2014-04-07 NOTE — Telephone Encounter (Signed)
Patient called - had  laparoscopic cholecystectomy on 04/02/14 and she is in a lot of pain.  Her surgeon - Dr. Gita Kudo, would not give her any additional medication because she had a contract with [place].

## 2014-04-07 NOTE — Telephone Encounter (Signed)
Routing message to Dr Zella Richer

## 2014-04-07 NOTE — Telephone Encounter (Signed)
See previous message. Please advise.  Jenna Molina pt and she gets hydrocodone from [place].

## 2014-04-07 NOTE — Telephone Encounter (Signed)
Please call [facility] office an allow them to give a 2 wk supply of whatever meds they usually use for pain post op, would rec oxyCR without acetaminophen

## 2014-04-21 ENCOUNTER — Encounter: Payer: Self-pay | Admitting: Registered Nurse

## 2014-04-21 ENCOUNTER — Encounter: Payer: Managed Care, Other (non HMO) | Attending: Physical Medicine & Rehabilitation | Admitting: Registered Nurse

## 2014-04-21 VITALS — BP 122/68 | HR 83 | Resp 14

## 2014-04-21 DIAGNOSIS — M791 Myalgia: Secondary | ICD-10-CM | POA: Insufficient documentation

## 2014-04-21 DIAGNOSIS — G894 Chronic pain syndrome: Secondary | ICD-10-CM | POA: Diagnosis present

## 2014-04-21 DIAGNOSIS — M609 Myositis, unspecified: Secondary | ICD-10-CM

## 2014-04-21 DIAGNOSIS — G43001 Migraine without aura, not intractable, with status migrainosus: Secondary | ICD-10-CM | POA: Diagnosis present

## 2014-04-21 DIAGNOSIS — Z5181 Encounter for therapeutic drug level monitoring: Secondary | ICD-10-CM | POA: Diagnosis present

## 2014-04-21 DIAGNOSIS — M797 Fibromyalgia: Secondary | ICD-10-CM

## 2014-04-21 DIAGNOSIS — Z79899 Other long term (current) drug therapy: Secondary | ICD-10-CM | POA: Insufficient documentation

## 2014-04-21 DIAGNOSIS — IMO0001 Reserved for inherently not codable concepts without codable children: Secondary | ICD-10-CM

## 2014-04-21 MED ORDER — HYDROCODONE-ACETAMINOPHEN 10-325 MG PO TABS: 2.0000 | ORAL_TABLET | Freq: Three times a day (TID) | ORAL | Status: DC

## 2014-04-21 NOTE — Progress Notes (Signed)
Subjective:    Patient ID: Jenna Molina, female    DOB: 09-19-1955, 59 y.o.   MRN: [id number]  HPI: Jenna Molina is a 59 year old female who returns for follow up appointment for chronic pain and medication refill. She says her pain is located in her neck, shoulder blades, lower back radiating into her left hip. Complaining of abdominal discomfort s/p laproscopic cholecystectomy with intra operative cholangiogram. Her current exercise regime walking in her home awaiting clearance from surgeon to resume exercise routine. She was admitted at [facility] 12/14- 12/19 admitted with right upper quadrant epigastric pain.  Cholelithiasis with chronic cholecystitis with bilary obstruction.   Pain Inventory Average Pain 8 Pain Right Now 8 My pain is constant, sharp, stabbing and aching  In the last 24 hours, has pain interfered with the following? General activity 8 Relation with others 8 Enjoyment of life 8 What TIME of day is your pain at its worst? all Sleep (in general) Poor  Pain is worse with: inactivity Pain improves with: rest, heat/ice and medication Relief from Meds: 3  Mobility walk without assistance ability to climb steps?  no do you drive?  yes Do you have any goals in this area?  no  Function disabled: date disabled . I need assistance with the following:  meal prep, household duties and shopping Do you have any goals in this area?  no  Neuro/Psych depression anxiety loss of taste or smell  Prior Studies Any changes since last visit?  yes CT/MRI ultrasound  Physicians involved in your care Any changes since last visit?  no   Family History  Problem Relation Age of Onset  . Cancer Mother   . Diabetes Mother   . Anxiety disorder Mother   . Emphysema Father   . Anxiety disorder Father   . Alcohol abuse Father   . COPD Father    History   Social History  . Marital Status: Married    Spouse Name: N/A    Number of Children: N/A  . Years of  Education: N/A   Social History Main Topics  . Smoking status: Former Smoker    Quit date: 06/19/2008  . Smokeless tobacco: Never Used  . Alcohol Use: No  . Drug Use: No  . Sexual Activity: None   Other Topics Concern  . None   Social History Narrative   Past Surgical History  Procedure Laterality Date  . Inner ear surgery    . Abdominal hysterectomy    . Appendectomy    . Mastoidectomy    . Mole removal    . Cholecystectomy N/A 04/01/2014    Procedure: LAPAROSCOPIC CHOLECYSTECTOMY WITH INTRAOPERATIVE CHOLANGIOGRAM;  Surgeon: Jackolyn Confer, MD;  Location: [facility];  Service: General;  Laterality: N/A;  . Ercp N/A 04/02/2014    Procedure: ENDOSCOPIC RETROGRADE CHOLANGIOPANCREATOGRAPHY (ERCP);  Surgeon: Milus Banister, MD;  Location: [facility];  Service: Gastroenterology;  Laterality: N/A;   Past Medical History  Diagnosis Date  . Migraines   . Fibromyalgia   . Cancer     Cervical cancer   BP 122/68 mmHg  Pulse 83  Resp 14  SpO2 96%  Opioid Risk Score:   Fall Risk Score: Low Fall Risk (0-5 points) (pt says she has rec'd pamphlet during previous visit) Review of Systems  Constitutional:       Loss of taste/smell Weight loss Poor appetite    Respiratory: Positive for cough.        Infection  Gastrointestinal:  Positive for abdominal pain.  Psychiatric/Behavioral: Positive for dysphoric mood. The patient is nervous/anxious.   All other systems reviewed and are negative.      Objective:   Physical Exam  Constitutional: She is oriented to person, place, and time. She appears well-developed and well-nourished.  HENT:  Head: Normocephalic and atraumatic.  Neck: Normal range of motion. Neck supple.  Cervical Paraspinal Tenderness: C-3- C-5  Cardiovascular: Normal rate and regular rhythm.   Pulmonary/Chest: Effort normal and breath sounds normal.  Abdominal: Soft. There is tenderness.  Sluggish Bowel Sounds  Musculoskeletal:  Normal Muscle Bulk and Muscle testing  Reveals: Upper Extremities: Full ROM and Muscle Strength 5/5 Thoracic Paraspinal Tenderness: T-4- T-7 Lumbar Paraspinal Tenderness Mainly Left Side L-3- L-5 Lower Extremities: Full ROM and Muscle strength 5/5 Arises from chair with ease Narrow Based gait  Neurological: She is alert and oriented to person, place, and time.  Skin: Skin is warm and dry.  Psychiatric: She has a normal mood and affect.  Nursing note and vitals reviewed.         Assessment & Plan:  1. FMS: When Cleared from surgeon, resume exercise routine. 2. Myalgia/ Myositis: Continue with heat and exercise regime. 3. Chronic Pain Syndrome: Refilled: Hydrocodone 10/325 mg take 2 tablets every 8 hours #180. Second script given. 4. Hx of major depression : Psychology following 3. Hypothyroid : PCP Following On Levothroid.  30 minutes of face to face patient care time was spent during this visit. All questions were encouraged and answered.   F/U in 2 months

## 2014-04-21 NOTE — Patient Instructions (Signed)
Fat and Cholesterol Control Diet Fat and cholesterol levels in your blood and organs are influenced by your diet. High levels of fat and cholesterol may lead to diseases of the heart, small and large blood vessels, gallbladder, liver, and pancreas. CONTROLLING FAT AND CHOLESTEROL WITH DIET Although exercise and lifestyle factors are important, your diet is key. That is because certain foods are known to raise cholesterol and others to lower it. The goal is to balance foods for their effect on cholesterol and more importantly, to replace saturated and trans fat with other types of fat, such as monounsaturated fat, polyunsaturated fat, and omega-3 fatty acids. On average, a person should consume no more than 15 to 17 g of saturated fat daily. Saturated and trans fats are considered "bad" fats, and they will raise LDL cholesterol. Saturated fats are primarily found in animal products such as meats, butter, and cream. However, that does not mean you need to give up all your favorite foods. Today, there are good tasting, low-fat, low-cholesterol substitutes for most of the things you like to eat. Choose low-fat or nonfat alternatives. Choose round or loin cuts of red meat. These types of cuts are lowest in fat and cholesterol. Chicken (without the skin), fish, veal, and ground turkey breast are great choices. Eliminate fatty meats, such as hot dogs and salami. Even shellfish have little or no saturated fat. Have a 3 oz (85 g) portion when you eat lean meat, poultry, or fish. Trans fats are also called "partially hydrogenated oils." They are oils that have been scientifically manipulated so that they are solid at room temperature resulting in a longer shelf life and improved taste and texture of foods in which they are added. Trans fats are found in stick margarine, some tub margarines, cookies, crackers, and baked goods.  When baking and cooking, oils are a great substitute for butter. The monounsaturated oils are  especially beneficial since it is believed they lower LDL and raise HDL. The oils you should avoid entirely are saturated tropical oils, such as coconut and palm.  Remember to eat a lot from food groups that are naturally free of saturated and trans fat, including fish, fruit, vegetables, beans, grains (barley, rice, couscous, bulgur wheat), and pasta (without cream sauces).  IDENTIFYING FOODS THAT LOWER FAT AND CHOLESTEROL  Soluble fiber may lower your cholesterol. This type of fiber is found in fruits such as apples, vegetables such as broccoli, potatoes, and carrots, legumes such as beans, peas, and lentils, and grains such as barley. Foods fortified with plant sterols (phytosterol) may also lower cholesterol. You should eat at least 2 g per day of these foods for a cholesterol lowering effect.  Read package labels to identify low-saturated fats, trans fat free, and low-fat foods at the supermarket. Select cheeses that have only 2 to 3 g saturated fat per ounce. Use a heart-healthy tub margarine that is free of trans fats or partially hydrogenated oil. When buying baked goods (cookies, crackers), avoid partially hydrogenated oils. Breads and muffins should be made from whole grains (whole-wheat or whole oat flour, instead of "flour" or "enriched flour"). Buy non-creamy canned soups with reduced salt and no added fats.  FOOD PREPARATION TECHNIQUES  Never deep-fry. If you must fry, either stir-fry, which uses very little fat, or use non-stick cooking sprays. When possible, broil, bake, or roast meats, and steam vegetables. Instead of putting butter or margarine on vegetables, use lemon and herbs, applesauce, and cinnamon (for squash and sweet potatoes). Use nonfat   yogurt, salsa, and low-fat dressings for salads.  LOW-SATURATED FAT / LOW-FAT FOOD SUBSTITUTES Meats / Saturated Fat (g)  Avoid: Steak, marbled (3 oz/85 g) / 11 g  Choose: Steak, lean (3 oz/85 g) / 4 g  Avoid: Hamburger (3 oz/85 g) / 7  g  Choose: Hamburger, lean (3 oz/85 g) / 5 g  Avoid: Ham (3 oz/85 g) / 6 g  Choose: Ham, lean cut (3 oz/85 g) / 2.4 g  Avoid: Chicken, with skin, dark meat (3 oz/85 g) / 4 g  Choose: Chicken, skin removed, dark meat (3 oz/85 g) / 2 g  Avoid: Chicken, with skin, light meat (3 oz/85 g) / 2.5 g  Choose: Chicken, skin removed, light meat (3 oz/85 g) / 1 g Dairy / Saturated Fat (g)  Avoid: Whole milk (1 cup) / 5 g  Choose: Low-fat milk, 2% (1 cup) / 3 g  Choose: Low-fat milk, 1% (1 cup) / 1.5 g  Choose: Skim milk (1 cup) / 0.3 g  Avoid: Hard cheese (1 oz/28 g) / 6 g  Choose: Skim milk cheese (1 oz/28 g) / 2 to 3 g  Avoid: Cottage cheese, 4% fat (1 cup) / 6.5 g  Choose: Low-fat cottage cheese, 1% fat (1 cup) / 1.5 g  Avoid: Ice cream (1 cup) / 9 g  Choose: Sherbet (1 cup) / 2.5 g  Choose: Nonfat frozen yogurt (1 cup) / 0.3 g  Choose: Frozen fruit bar / trace  Avoid: Whipped cream (1 tbs) / 3.5 g  Choose: Nondairy whipped topping (1 tbs) / 1 g Condiments / Saturated Fat (g)  Avoid: Mayonnaise (1 tbs) / 2 g  Choose: Low-fat mayonnaise (1 tbs) / 1 g  Avoid: Butter (1 tbs) / 7 g  Choose: Extra light margarine (1 tbs) / 1 g  Avoid: Coconut oil (1 tbs) / 11.8 g  Choose: Olive oil (1 tbs) / 1.8 g  Choose: Corn oil (1 tbs) / 1.7 g  Choose: Safflower oil (1 tbs) / 1.2 g  Choose: Sunflower oil (1 tbs) / 1.4 g  Choose: Soybean oil (1 tbs) / 2.4 g  Choose: Canola oil (1 tbs) / 1 g Document Released: 04/02/2005 Document Revised: 07/28/2012 Document Reviewed: 07/01/2013 Exit[employer] Patient Information 2015 [employer]. This information is not intended to replace advice given to you by your health care provider. Make sure you discuss any questions you have with your health care provider.  

## 2014-04-26 ENCOUNTER — Telehealth: Payer: Self-pay | Admitting: Nurse Practitioner

## 2014-04-26 NOTE — Telephone Encounter (Signed)
Left message for patient to call back  

## 2014-04-27 NOTE — Telephone Encounter (Signed)
I reviewed the ERCP with the patient and all of her questions were answered.  She will call back for any additional questions or concerns

## 2014-04-30 ENCOUNTER — Ambulatory Visit: Payer: Medicare Other | Admitting: Physical Medicine & Rehabilitation

## 2014-06-10 ENCOUNTER — Other Ambulatory Visit: Payer: Self-pay | Admitting: Physical Medicine & Rehabilitation

## 2014-06-10 ENCOUNTER — Telehealth: Payer: Self-pay | Admitting: *Deleted

## 2014-06-10 NOTE — Telephone Encounter (Signed)
Patient called stating that she needs a refill on her flexeril.  That was electronically sent to the [facility].  Called and left message that the RX was sent in

## 2014-06-16 ENCOUNTER — Encounter: Payer: Self-pay | Admitting: Physical Medicine & Rehabilitation

## 2014-06-16 ENCOUNTER — Encounter
Payer: Managed Care, Other (non HMO) | Attending: Physical Medicine & Rehabilitation | Admitting: Physical Medicine & Rehabilitation

## 2014-06-16 VITALS — BP 108/69 | HR 81 | Resp 14 | Wt 123.8 lb

## 2014-06-16 DIAGNOSIS — G43001 Migraine without aura, not intractable, with status migrainosus: Secondary | ICD-10-CM | POA: Diagnosis present

## 2014-06-16 DIAGNOSIS — Z79899 Other long term (current) drug therapy: Secondary | ICD-10-CM | POA: Insufficient documentation

## 2014-06-16 DIAGNOSIS — M609 Myositis, unspecified: Secondary | ICD-10-CM | POA: Insufficient documentation

## 2014-06-16 DIAGNOSIS — M791 Myalgia: Secondary | ICD-10-CM

## 2014-06-16 DIAGNOSIS — G43019 Migraine without aura, intractable, without status migrainosus: Secondary | ICD-10-CM

## 2014-06-16 DIAGNOSIS — G894 Chronic pain syndrome: Secondary | ICD-10-CM | POA: Diagnosis not present

## 2014-06-16 DIAGNOSIS — Z5181 Encounter for therapeutic drug level monitoring: Secondary | ICD-10-CM | POA: Diagnosis present

## 2014-06-16 DIAGNOSIS — IMO0001 Reserved for inherently not codable concepts without codable children: Secondary | ICD-10-CM

## 2014-06-16 MED ORDER — HYDROCODONE-ACETAMINOPHEN 10-325 MG PO TABS
2.0000 | ORAL_TABLET | Freq: Three times a day (TID) | ORAL | Status: DC
Start: 2014-06-16 — End: 2014-06-16

## 2014-06-16 MED ORDER — CYCLOBENZAPRINE HCL 10 MG PO TABS: 20.0000 mg | ORAL_TABLET | Freq: Every day | ORAL | Status: DC

## 2014-06-16 MED ORDER — HYDROCODONE-ACETAMINOPHEN 10-325 MG PO TABS: 2.0000 | ORAL_TABLET | Freq: Three times a day (TID) | ORAL | Status: DC

## 2014-06-16 NOTE — Progress Notes (Signed)
Subjective:    Patient ID: Jenna Molina, female    DOB: 01/10/1956, 59 y.o.   MRN: [id number]  HPI   Jenna Molina is back regarding her chronic pain. She has had chronic right shoulder pain after having her shoulder yanked by her dog two years ago. She noticed 2 months ago that her shoulder began to bother her more. (she tends to sleep on her right and left sides).  She has used heat and a topical product which seems to help temporarily.   Jenna Molina had questions about her chronic opioid use as well as constipation today.   Pain Inventory Average Pain 6 Pain Right Now 6 My pain is sharp, burning, dull, stabbing and aching  In the last 24 hours, has pain interfered with the following? General activity 0 Relation with others 0 Enjoyment of life 0 What TIME of day is your pain at its worst? morning and night Sleep (in general) Fair  Pain is worse with: walking, sitting and some activites Pain improves with: rest, heat/ice, medication and injections Relief from Meds: 5  Mobility ability to climb steps?  yes do you drive?  yes  Function disabled: date disabled 07/2004 I need assistance with the following:  meal prep, household duties and shopping  Neuro/Psych weakness numbness depression anxiety  Prior Studies Any changes since last visit?  no  Physicians involved in your care Any changes since last visit?  no   Family History  Problem Relation Age of Onset  . Cancer Mother   . Diabetes Mother   . Anxiety disorder Mother   . Emphysema Father   . Anxiety disorder Father   . Alcohol abuse Father   . COPD Father    History   Social History  . Marital Status: Married    Spouse Name: N/A  . Number of Children: N/A  . Years of Education: N/A   Social History Main Topics  . Smoking status: Former Smoker    Quit date: 06/19/2008  . Smokeless tobacco: Never Used  . Alcohol Use: No  . Drug Use: No  . Sexual Activity: Not on file   Other Topics Concern  . None    Social History Narrative   Past Surgical History  Procedure Laterality Date  . Inner ear surgery    . Abdominal hysterectomy    . Appendectomy    . Mastoidectomy    . Mole removal    . Cholecystectomy N/A 04/01/2014    Procedure: LAPAROSCOPIC CHOLECYSTECTOMY WITH INTRAOPERATIVE CHOLANGIOGRAM;  Surgeon: Jackolyn Confer, MD;  Location: [facility];  Service: General;  Laterality: N/A;  . Ercp N/A 04/02/2014    Procedure: ENDOSCOPIC RETROGRADE CHOLANGIOPANCREATOGRAPHY (ERCP);  Surgeon: Milus Banister, MD;  Location: [facility];  Service: Gastroenterology;  Laterality: N/A;   Past Medical History  Diagnosis Date  . Migraines   . Fibromyalgia   . Cancer     Cervical cancer   BP 108/69 mmHg  Pulse 81  Resp 14  Wt 123 lb 12.8 oz (56.155 kg)  SpO2 97%  Opioid Risk Score:   Fall Risk Score: Moderate Fall Risk (6-13 points) (previously educated and given handout)  Review of Systems  Constitutional: Positive for appetite change.  Gastrointestinal: Positive for constipation.  Neurological: Positive for weakness and numbness.  Psychiatric/Behavioral: Positive for dysphoric mood. The patient is nervous/anxious.   All other systems reviewed and are negative.      Objective:   Physical Exam  General: Alert and oriented x 3, No apparent  distress  HEENT: Head is normocephalic, atraumatic, PERRLA, EOMI, sclera anicteric, oral mucosa pink and moist, dentition intact, ext ear canals clear,  Neck: Supple without JVD or lymphadenopathy  Heart: Reg rate and rhythm. No murmurs rubs or gallops  Chest: CTA bilaterally without wheezes, rales, or rhonchi; no distress  Abdomen: Slightly distended. Mild epigastric pain with palpation  Extremities: No clubbing, cyanosis, or edema. Pulses are 2+  Skin: Clean and intact without signs of breakdown  Neuro: Pt is cognitively appropriate with normal insight, memory, and awareness. Cranial nerves 2-12 are intact. Sensory exam is normal. Reflexes are 2+ in all  4's. Fine motor coordination is intact. No tremors. Motor function is grossly 5/5. SLR is negative,  Musculoskeletal: good lumbar flexion. Cervical ROM functional. Hamstrings are more flexible. Numerous tender points along knees, greater trochs, PSIS's, low back, elbows, sternum, mid traps, shoulders, sub-occipital all appear less severe today. Gait is stable, normal. She had pain along the belly of the upper right infraspinatus and more distally as well---palpation reproduced pain. There was mild pain with right shoulder extension and ER Psych: Pt's affect is appropriate. Pt is cooperative. Very pleasant. Spirits better    Assessment & Plan:   1. FMS with associated symptoms including: chronic fatigue, depression, migraine headaches, RLS, TMJ, numerous tender points and trigger points.  2. Hx of major depression  3. Hypothyroid    Plan:  1. Refilled hydrocodone 10/325 one to 2 q8 prn #180.  I gave her a second rx for next month. We discussed opioid hypersensitivity. She will work to decrease the dosing as possible 2. After informed consent and preparation of the skin with isopropyl alcohol, I injected the right infraspinatus with 2cc of 1% lidocaine. The patient tolerated well, and no complications were experienced. Post-injection instructions were provided.  We reviewed and provided scapular and shoulder exercises today also. 3. Maintain exercise program as she's doing. She understands the importance of stress/anxiety mgt.  4. Continue bowel program as she's doing.  May try senokot s at bedtime in place of senna.  5. Follow up with me in two months. 15 minutes of face to face patient care time were spent during this visit. All questions were encouraged and answered.

## 2014-06-16 NOTE — Patient Instructions (Signed)

## 2014-07-29 ENCOUNTER — Other Ambulatory Visit (HOSPITAL_COMMUNITY): Payer: Self-pay | Admitting: Obstetrics and Gynecology

## 2014-07-29 DIAGNOSIS — Z1231 Encounter for screening mammogram for malignant neoplasm of breast: Secondary | ICD-10-CM

## 2014-08-10 ENCOUNTER — Ambulatory Visit (HOSPITAL_COMMUNITY)
Admission: RE | Admit: 2014-08-10 | Discharge: 2014-08-10 | Disposition: A | Discharge status: Home / Self Care | Payer: Medicare Other | Source: Ambulatory Visit | Attending: Obstetrics and Gynecology | Admitting: Obstetrics and Gynecology

## 2014-08-10 DIAGNOSIS — Z1231 Encounter for screening mammogram for malignant neoplasm of breast: Secondary | ICD-10-CM

## 2014-08-16 ENCOUNTER — Encounter: Payer: Self-pay | Admitting: Physical Medicine & Rehabilitation

## 2014-08-16 ENCOUNTER — Other Ambulatory Visit: Payer: Self-pay | Admitting: Obstetrics and Gynecology

## 2014-08-16 ENCOUNTER — Encounter
Payer: Managed Care, Other (non HMO) | Attending: Physical Medicine & Rehabilitation | Admitting: Physical Medicine & Rehabilitation

## 2014-08-16 VITALS — BP 116/65 | HR 79 | Resp 16

## 2014-08-16 DIAGNOSIS — M7551 Bursitis of right shoulder: Secondary | ICD-10-CM

## 2014-08-16 DIAGNOSIS — G43019 Migraine without aura, intractable, without status migrainosus: Secondary | ICD-10-CM | POA: Diagnosis not present

## 2014-08-16 DIAGNOSIS — M791 Myalgia: Secondary | ICD-10-CM | POA: Diagnosis present

## 2014-08-16 DIAGNOSIS — G894 Chronic pain syndrome: Secondary | ICD-10-CM | POA: Diagnosis present

## 2014-08-16 DIAGNOSIS — G43001 Migraine without aura, not intractable, with status migrainosus: Secondary | ICD-10-CM

## 2014-08-16 DIAGNOSIS — M7550 Bursitis of unspecified shoulder: Secondary | ICD-10-CM | POA: Insufficient documentation

## 2014-08-16 DIAGNOSIS — M797 Fibromyalgia: Secondary | ICD-10-CM | POA: Diagnosis not present

## 2014-08-16 DIAGNOSIS — Z79899 Other long term (current) drug therapy: Secondary | ICD-10-CM | POA: Insufficient documentation

## 2014-08-16 DIAGNOSIS — R2231 Localized swelling, mass and lump, right upper limb: Secondary | ICD-10-CM

## 2014-08-16 DIAGNOSIS — IMO0001 Reserved for inherently not codable concepts without codable children: Secondary | ICD-10-CM

## 2014-08-16 DIAGNOSIS — M609 Myositis, unspecified: Secondary | ICD-10-CM | POA: Diagnosis present

## 2014-08-16 DIAGNOSIS — N63 Unspecified lump in unspecified breast: Secondary | ICD-10-CM

## 2014-08-16 DIAGNOSIS — Z5181 Encounter for therapeutic drug level monitoring: Secondary | ICD-10-CM | POA: Insufficient documentation

## 2014-08-16 MED ORDER — HYDROCODONE-ACETAMINOPHEN 10-325 MG PO TABS: 2.0000 | ORAL_TABLET | Freq: Three times a day (TID) | ORAL | Status: DC

## 2014-08-16 NOTE — Progress Notes (Signed)
Subjective:    Patient ID: Jenna Molina, female    DOB: Nov 13, 1955, 59 y.o.   MRN: [id number]   HPI    Jenna Molina is here in follow up of her chronic pain. She had some positive results with the right shoulder injection, however the pain has moved more superiorly and anteriorly.  She has a problem with reaching out or up.   She has generally more fibromyalgia symptoms with the recent damp weather.      Pain Inventory Average Pain 7 Pain Right Now 9 My pain is sharp, dull and aching  In the last 24 hours, has pain interfered with the following? General activity 9 Relation with others 9 Enjoyment of life 9 What TIME of day is your pain at its worst? all Sleep (in general) Fair  Pain is worse with: sitting and some activites Pain improves with: rest, heat/ice, medication and injections Relief from Meds: 7  Mobility how many minutes can you walk? 30 do you drive?  yes  Function disabled: date disabled 2006 I need assistance with the following:  household duties and shopping  Neuro/Psych depression anxiety  Prior Studies Any changes since last visit?  no  Physicians involved in your care Any changes since last visit?  no   Family History  Problem Relation Age of Onset  . Cancer Mother   . Diabetes Mother   . Anxiety disorder Mother   . Emphysema Father   . Anxiety disorder Father   . Alcohol abuse Father   . COPD Father    History   Social History  . Marital Status: Married    Spouse Name: N/A  . Number of Children: N/A  . Years of Education: N/A   Social History Main Topics  . Smoking status: Former Smoker    Quit date: 06/19/2008  . Smokeless tobacco: Never Used  . Alcohol Use: No  . Drug Use: No  . Sexual Activity: Not on file   Other Topics Concern  . None   Social History Narrative   Past Surgical History  Procedure Laterality Date  . Inner ear surgery    . Abdominal hysterectomy    . Appendectomy    . Mastoidectomy    . Mole removal     . Cholecystectomy N/A 04/01/2014    Procedure: LAPAROSCOPIC CHOLECYSTECTOMY WITH INTRAOPERATIVE CHOLANGIOGRAM;  Surgeon: Jackolyn Confer, MD;  Location: [facility];  Service: General;  Laterality: N/A;  . Ercp N/A 04/02/2014    Procedure: ENDOSCOPIC RETROGRADE CHOLANGIOPANCREATOGRAPHY (ERCP);  Surgeon: Milus Banister, MD;  Location: [facility];  Service: Gastroenterology;  Laterality: N/A;   Past Medical History  Diagnosis Date  . Migraines   . Fibromyalgia   . Cancer     Cervical cancer   BP 116/65 mmHg  Pulse 79  Resp 16  SpO2 97%  Opioid Risk Score:   Fall Risk Score: Low Fall Risk (0-5 points) (previously educated and given handout)`1  Depression screen PHQ 2/9  No flowsheet data found.   Review of Systems  Psychiatric/Behavioral: Positive for dysphoric mood. The patient is nervous/anxious.   All other systems reviewed and are negative.      Objective:   Physical Exam  General: Alert and oriented x 3, No apparent distress  HEENT: Head is normocephalic, atraumatic, PERRLA, EOMI, sclera anicteric, oral mucosa pink and moist, dentition intact, ext ear canals clear,  Neck: Supple without JVD or lymphadenopathy  Heart: Reg rate and rhythm. No murmurs rubs or gallops  Chest: CTA  bilaterally without wheezes, rales, or rhonchi; no distress  Abdomen: Slightly distended. Mild epigastric pain with palpation  Extremities: No clubbing, cyanosis, or edema. Pulses are 2+  Skin: Clean and intact without signs of breakdown  Neuro: Pt is cognitively appropriate with normal insight, memory, and awareness. Cranial nerves 2-12 are intact. Sensory exam is normal. Reflexes are 2+ in all 4's. Fine motor coordination is intact. No tremors. Motor function is grossly 5/5. SLR is negative,  Musculoskeletal: good lumbar flexion. Cervical ROM functional. Hamstrings are more flexible. Numerous tender points along knees, greater trochs, PSIS's, low back, elbows, sternum, mid traps, shoulders, . Gait is  stable, normal. Pain along upper humeral head/glenoid. Subacromial bursa tender also. Pain with abduction and flexion of shoulder.  Psych: Pt's affect is appropriate. Pt is cooperative. Very pleasant. Spirits better   Assessment & Plan:   1. FMS with associated symptoms including: chronic fatigue, depression, migraine headaches, RLS, TMJ, numerous tender points and trigger points.  2. Hx of major depression  3. Hypothyroid  4. Right shoulder pain. ?right subacromial bursitis   Plan:   1. Refilled hydrocodone 10/325 one to 2 q8 prn #180. I gave her a second rx for next month. We discussed opioid hypersensitivity. She will work to decrease the dosing as possible  2. Consider shoulder injection. Shoulder exercises were provided today.  3. stress/anxiety mgt.  4. Continue bowel program as she's doing.   5. Follow up with me in two months. 15 minutes of face to face patient care time were spent during this visit. All questions were encouraged and answered.

## 2014-08-16 NOTE — Patient Instructions (Signed)

## 2014-08-26 ENCOUNTER — Telehealth: Payer: Self-pay | Admitting: *Deleted

## 2014-08-26 ENCOUNTER — Encounter: Payer: Self-pay | Admitting: Physical Medicine & Rehabilitation

## 2014-08-26 NOTE — Telephone Encounter (Signed)
Jenna Molina calling about her lab from her last visit (U[facility]) and wondering why it has not resulted.  She is going out of town and wondering if she needs to come back in since it has been 10 days.  She sent an email as well and I have responded to her via [employer].  It has not resulted and I will have [name] our processor check on this tomorrow and let [name] know what has happened to the UDS.

## 2014-09-01 ENCOUNTER — Ambulatory Visit
Admission: RE | Admit: 2014-09-01 | Discharge: 2014-09-01 | Disposition: A | Discharge status: Home / Self Care | Payer: Managed Care, Other (non HMO) | Source: Ambulatory Visit | Attending: Obstetrics and Gynecology | Admitting: Obstetrics and Gynecology

## 2014-09-01 DIAGNOSIS — N63 Unspecified lump in unspecified breast: Secondary | ICD-10-CM

## 2014-09-01 DIAGNOSIS — R2231 Localized swelling, mass and lump, right upper limb: Secondary | ICD-10-CM

## 2014-09-07 ENCOUNTER — Ambulatory Visit
Admission: RE | Admit: 2014-09-07 | Discharge: 2014-09-07 | Disposition: A | Discharge status: Home / Self Care | Payer: Managed Care, Other (non HMO) | Source: Ambulatory Visit | Attending: Obstetrics and Gynecology | Admitting: Obstetrics and Gynecology

## 2014-09-07 ENCOUNTER — Other Ambulatory Visit: Payer: Self-pay | Admitting: Obstetrics and Gynecology

## 2014-09-07 DIAGNOSIS — R2231 Localized swelling, mass and lump, right upper limb: Secondary | ICD-10-CM

## 2014-09-07 DIAGNOSIS — N63 Unspecified lump in unspecified breast: Secondary | ICD-10-CM

## 2014-10-06 ENCOUNTER — Encounter: Payer: Self-pay | Admitting: Physical Medicine & Rehabilitation

## 2014-10-12 ENCOUNTER — Other Ambulatory Visit: Payer: Self-pay | Admitting: Physical Medicine & Rehabilitation

## 2014-10-12 ENCOUNTER — Encounter
Payer: Managed Care, Other (non HMO) | Attending: Physical Medicine & Rehabilitation | Admitting: Physical Medicine & Rehabilitation

## 2014-10-12 ENCOUNTER — Encounter: Payer: Self-pay | Admitting: Physical Medicine & Rehabilitation

## 2014-10-12 VITALS — BP 102/66 | HR 78 | Resp 16

## 2014-10-12 DIAGNOSIS — G43001 Migraine without aura, not intractable, with status migrainosus: Secondary | ICD-10-CM | POA: Diagnosis present

## 2014-10-12 DIAGNOSIS — M75101 Unspecified rotator cuff tear or rupture of right shoulder, not specified as traumatic: Secondary | ICD-10-CM | POA: Diagnosis not present

## 2014-10-12 DIAGNOSIS — Z79899 Other long term (current) drug therapy: Secondary | ICD-10-CM

## 2014-10-12 DIAGNOSIS — M609 Myositis, unspecified: Secondary | ICD-10-CM | POA: Diagnosis present

## 2014-10-12 DIAGNOSIS — M791 Myalgia: Secondary | ICD-10-CM | POA: Diagnosis present

## 2014-10-12 DIAGNOSIS — Z5181 Encounter for therapeutic drug level monitoring: Secondary | ICD-10-CM | POA: Diagnosis present

## 2014-10-12 DIAGNOSIS — M7551 Bursitis of right shoulder: Secondary | ICD-10-CM

## 2014-10-12 DIAGNOSIS — G894 Chronic pain syndrome: Secondary | ICD-10-CM | POA: Diagnosis not present

## 2014-10-12 DIAGNOSIS — IMO0001 Reserved for inherently not codable concepts without codable children: Secondary | ICD-10-CM

## 2014-10-12 MED ORDER — HYDROCODONE-ACETAMINOPHEN 10-325 MG PO TABS: 2.0000 | ORAL_TABLET | Freq: Three times a day (TID) | ORAL | Status: DC

## 2014-10-12 NOTE — Patient Instructions (Signed)

## 2014-10-12 NOTE — Progress Notes (Signed)
Subjective:    Patient ID: Jenna Molina, female    DOB: 1955-05-30, 59 y.o.   MRN: [id number]  HPI   Jenna Molina is here in follow up of her chronic pain. She is having more right shoulder pain which seems to be moving a little bit at times. Currently it's more posterior than anything else. She finds it difficult to put clothes on and do anything overhead.  She maintains on her hydrocodone for pain control per her prior schedule, two tabs q8.  Needs to see [name] for UDS after visit. Last urine collected was not resulted and could not be found so this is repeat.  There continue to be a lot of stresses at home which impact her daily life. She wonders when things will start to back off.   Pain Inventory Average Pain 8 Pain Right Now 9 My pain is constant, sharp, burning, stabbing and aching  In the last 24 hours, has pain interfered with the following? General activity 9 Relation with others 9 Enjoyment of life 9 What TIME of day is your pain at its worst? daytime evening and night Sleep (in general) Fair  Pain is worse with: walking, bending, sitting, standing and some activites Pain improves with: rest, heat/ice, pacing activities, medication and injections Relief from Meds: 6  Mobility walk without assistance ability to climb steps?  yes do you drive?  yes  Function disabled: date disabled 2006 I need assistance with the following:  meal prep, household duties and shopping  Neuro/Psych bowel control problems depression anxiety  Prior Studies Any changes since last visit?  no  Physicians involved in your care Any changes since last visit?  no   Family History  Problem Relation Age of Onset  . Cancer Mother   . Diabetes Mother   . Anxiety disorder Mother   . Emphysema Father   . Anxiety disorder Father   . Alcohol abuse Father   . COPD Father    History   Social History  . Marital Status: Married    Spouse Name: N/A  . Number of Children: N/A  .  Years of Education: N/A   Social History Main Topics  . Smoking status: Former Smoker    Quit date: 06/19/2008  . Smokeless tobacco: Never Used  . Alcohol Use: No  . Drug Use: No  . Sexual Activity: Not on file   Other Topics Concern  . None   Social History Narrative   Past Surgical History  Procedure Laterality Date  . Inner ear surgery    . Abdominal hysterectomy    . Appendectomy    . Mastoidectomy    . Mole removal    . Cholecystectomy N/A 04/01/2014    Procedure: LAPAROSCOPIC CHOLECYSTECTOMY WITH INTRAOPERATIVE CHOLANGIOGRAM;  Surgeon: Jackolyn Confer, MD;  Location: [facility];  Service: General;  Laterality: N/A;  . Ercp N/A 04/02/2014    Procedure: ENDOSCOPIC RETROGRADE CHOLANGIOPANCREATOGRAPHY (ERCP);  Surgeon: Milus Banister, MD;  Location: [facility];  Service: Gastroenterology;  Laterality: N/A;   Past Medical History  Diagnosis Date  . Migraines   . Fibromyalgia   . Cancer     Cervical cancer   BP 102/66 mmHg  Pulse 78  Resp 16  SpO2 95%  Opioid Risk Score:   Fall Risk Score: Moderate Fall Risk (6-13 points) (previously educated and given handout)`1  Depression screen PHQ 2/9  Depression screen PHQ 2/9 10/12/2014  Decreased Interest 2  Down, Depressed, Hopeless 2  PHQ - 2  Score 4  Altered sleeping 2  Tired, decreased energy 2  Change in appetite 1  Feeling bad or failure about yourself  1  Trouble concentrating 0  Moving slowly or fidgety/restless 0  Suicidal thoughts 1  PHQ-9 Score 11    Review of Systems  Gastrointestinal:       Bowel Control Problems  Psychiatric/Behavioral: Positive for dysphoric mood. The patient is nervous/anxious.   All other systems reviewed and are negative.      Objective:   Physical Exam  General: Alert and oriented x 3, No apparent distress  HEENT: Head is normocephalic, atraumatic, PERRLA, EOMI, sclera anicteric, oral mucosa pink and moist, dentition intact, ext ear canals clear,  Neck: Supple without JVD or  lymphadenopathy  Heart: Reg rate and rhythm. No murmurs rubs or gallops  Chest: CTA bilaterally without wheezes, rales, or rhonchi; no distress  Abdomen: Slightly distended. Mild epigastric pain with palpation  Extremities: No clubbing, cyanosis, or edema. Pulses are 2+  Skin: Clean and intact without signs of breakdown  Neuro: Pt is cognitively appropriate with normal insight, memory, and awareness. Cranial nerves 2-12 are intact. Sensory exam is normal. Reflexes are 2+ in all 4's. Fine motor coordination is intact. No tremors. Motor function is grossly 5/5. SLR is negative  Musculoskeletal: good lumbar flexion. Cervical ROM functional. Hamstrings are more flexible. Numerous tender points along knees, greater trochs, PSIS's, low back, elbows, sternum, mid traps, shoulders, . Gait is stable, normal. Pain along upper humeral head/glenoid. Subacromial bursa tender also. Pain with IR/ER of the right shoulder. Pain with palpation of the Teres minor tendon--reproduced. Psych: Pt's affect is appropriate. Pt is cooperative. Very pleasant. Spirits better   Assessment & Plan:   1. FMS with associated symptoms including: chronic fatigue, depression, migraine headaches, RLS, TMJ, numerous tender points and trigger points.  2. Hx of major depression  3. Hypothyroid  4. Right shoulder pain. Continued bursitis, rotator cuff (teres minor) tendonitis    Plan:  1. Refilled hydrocodone 10/325 one to 2 q8 prn #180. I gave her a second rx for next month.  2. After informed consent and preparation of the skin with betadine and isopropyl alcohol, I injected 4.'5mg'$  (0.75cc) of celestone and 2.5cc of 1% lidocaine around the right teres minor tendon via posterior approach. Additionally, aspiration was performed prior to injection. The patient tolerated well, and no complications were encountered. Afterward the area was cleaned and dressed. Post- injection instructions were provided.  Extensive RTC exercises were  provided. 3. stress/anxiety mgt remains key. We discussed again today 4. Continue bowel program as she's doing.  5. Follow up with me in two months. 15 minutes of face to face patient care time were spent during this visit. All questions were encouraged and answered.

## 2014-10-13 LAB — PMP ALCOHOL METABOLITE (ETG): Ethyl Glucuronide (EtG): NEGATIVE ng/mL

## 2014-10-16 LAB — OPIATES/OPIOIDS (LC/MS-MS)
Codeine Urine: NEGATIVE ng/mL (ref <50)
Hydrocodone: 1743 ng/mL (ref <50)
Hydromorphone: 97 ng/mL (ref <50)
MORPHINE: NEGATIVE ng/mL (ref <50)
NORHYDROCODONE, UR: 4688 ng/mL (ref <50)
Noroxycodone, Ur: NEGATIVE ng/mL (ref <50)
OXYCODONE, UR: NEGATIVE ng/mL (ref <50)
OXYMORPHONE, URINE: NEGATIVE ng/mL (ref <50)

## 2014-10-16 LAB — BENZODIAZEPINES (GC/LC/MS), URINE
ALPRAZOLAMU: NEGATIVE ng/mL (ref <25)
CLONAZEPAU: NEGATIVE ng/mL (ref <25)
FLURAZEPAMU: NEGATIVE ng/mL (ref <50)
Lorazepam (GC/LC/MS), ur confirm: 1436 ng/mL — AB (ref <50)
Midazolam (GC/LC/MS), ur confirm: NEGATIVE ng/mL (ref <50)
Nordiazepam (GC/LC/MS), ur confirm: NEGATIVE ng/mL (ref <50)
Oxazepam (GC/LC/MS), ur confirm: NEGATIVE ng/mL (ref <50)
Temazepam (GC/LC/MS), ur confirm: NEGATIVE ng/mL (ref <50)
Triazolam metabolite (GC/LC/MS), ur confirm: NEGATIVE ng/mL (ref <50)

## 2014-10-19 LAB — PRESCRIPTION MONITORING PROFILE (SOLSTAS)
AMPHETAMINE/METH: NEGATIVE ng/mL
BUPRENORPHINE, URINE: NEGATIVE ng/mL
Barbiturate Screen, Urine: NEGATIVE ng/mL
CARISOPRODOL, URINE: NEGATIVE ng/mL
Cannabinoid Scrn, Ur: NEGATIVE ng/mL
Cocaine Metabolites: NEGATIVE ng/mL
Creatinine, Urine: 36.8 mg/dL (ref >20.0)
FENTANYL URINE: NEGATIVE ng/mL
MDMA URINE: NEGATIVE ng/mL
MEPERIDINE UR: NEGATIVE ng/mL
Methadone Screen, Urine: NEGATIVE ng/mL
NITRITES URINE, INITIAL: NEGATIVE ug/mL
Oxycodone Screen, Ur: NEGATIVE ng/mL
Propoxyphene: NEGATIVE ng/mL
Tapentadol, urine: NEGATIVE ng/mL
Tramadol Scrn, Ur: NEGATIVE ng/mL
ZOLPIDEM, URINE: NEGATIVE ng/mL
pH, Initial: 5.1 pH (ref 4.5–8.9)

## 2014-10-26 NOTE — Progress Notes (Signed)
Urine drug screen for this encounter is consistent for prescribed medication 

## 2014-12-06 ENCOUNTER — Encounter: Payer: Managed Care, Other (non HMO) | Attending: Physical Medicine & Rehabilitation | Admitting: Registered Nurse

## 2014-12-06 ENCOUNTER — Encounter: Payer: Self-pay | Admitting: Registered Nurse

## 2014-12-06 VITALS — BP 99/66 | HR 77

## 2014-12-06 DIAGNOSIS — M797 Fibromyalgia: Secondary | ICD-10-CM | POA: Diagnosis not present

## 2014-12-06 DIAGNOSIS — M791 Myalgia: Secondary | ICD-10-CM | POA: Diagnosis present

## 2014-12-06 DIAGNOSIS — G43001 Migraine without aura, not intractable, with status migrainosus: Secondary | ICD-10-CM | POA: Diagnosis present

## 2014-12-06 DIAGNOSIS — Z79899 Other long term (current) drug therapy: Secondary | ICD-10-CM | POA: Insufficient documentation

## 2014-12-06 DIAGNOSIS — G894 Chronic pain syndrome: Secondary | ICD-10-CM | POA: Diagnosis present

## 2014-12-06 DIAGNOSIS — M25511 Pain in right shoulder: Secondary | ICD-10-CM

## 2014-12-06 DIAGNOSIS — M609 Myositis, unspecified: Secondary | ICD-10-CM | POA: Insufficient documentation

## 2014-12-06 DIAGNOSIS — Z5181 Encounter for therapeutic drug level monitoring: Secondary | ICD-10-CM | POA: Insufficient documentation

## 2014-12-06 DIAGNOSIS — M75101 Unspecified rotator cuff tear or rupture of right shoulder, not specified as traumatic: Secondary | ICD-10-CM

## 2014-12-06 DIAGNOSIS — IMO0001 Reserved for inherently not codable concepts without codable children: Secondary | ICD-10-CM

## 2014-12-06 MED ORDER — HYDROCODONE-ACETAMINOPHEN 10-325 MG PO TABS: 2.0000 | ORAL_TABLET | Freq: Three times a day (TID) | ORAL | Status: DC

## 2014-12-06 NOTE — Progress Notes (Signed)
Subjective:    Patient ID: Jenna Molina, female    DOB: 12/17/1955, 59 y.o.   MRN: [id number]  HPI: Ms. Claressa Hughley is a 59 year old female who returns for follow up appointment for chronic pain and medication refill. She says her pain is located in her right shoulder. X-ray ordered. She rates her pain 7. Her current exercise regime is performing stretching exercises.  Pain Inventory Average Pain 7 Pain Right Now 7 My pain is sharp, burning, dull, stabbing and aching  In the last 24 hours, has pain interfered with the following? General activity 7 Relation with others 7 Enjoyment of life 7 What TIME of day is your pain at its worst? evening Sleep (in general) Fair  Pain is worse with: walking, bending and inactivity Pain improves with: rest, heat/ice, pacing activities, medication and injections Relief from Meds: good  Mobility Do you drive: Yes Use a wheelchair Do you have any goals in this area: no  Function disabled: date disabled 2006 I need assistance with the following:  meal prep, household duties and shopping Do you have any goals in this area?  no  Neuro/Psych bowel control problems weakness numbness tingling spasms dizziness confusion depression anxiety  Prior Studies Any changes since last visit?  no  Physicians involved in your care Any changes since last visit?  no Primary care na   Family History  Problem Relation Age of Onset  . Cancer Mother   . Diabetes Mother   . Anxiety disorder Mother   . Emphysema Father   . Anxiety disorder Father   . Alcohol abuse Father   . COPD Father    Social History   Social History  . Marital Status: Married    Spouse Name: N/A  . Number of Children: N/A  . Years of Education: N/A   Social History Main Topics  . Smoking status: Former Smoker    Quit date: 06/19/2008  . Smokeless tobacco: Never Used  . Alcohol Use: No  . Drug Use: No  . Sexual Activity: Not on file   Other Topics Concern  .  Not on file   Social History Narrative   Past Surgical History  Procedure Laterality Date  . Inner ear surgery    . Abdominal hysterectomy    . Appendectomy    . Mastoidectomy    . Mole removal    . Cholecystectomy N/A 04/01/2014    Procedure: LAPAROSCOPIC CHOLECYSTECTOMY WITH INTRAOPERATIVE CHOLANGIOGRAM;  Surgeon: Jackolyn Confer, MD;  Location: [facility];  Service: General;  Laterality: N/A;  . Ercp N/A 04/02/2014    Procedure: ENDOSCOPIC RETROGRADE CHOLANGIOPANCREATOGRAPHY (ERCP);  Surgeon: Milus Banister, MD;  Location: [facility];  Service: Gastroenterology;  Laterality: N/A;   Past Medical History  Diagnosis Date  . Migraines   . Fibromyalgia   . Cancer     Cervical cancer   There were no vitals taken for this visit.  Opioid Risk Score:   Fall Risk Score:  `1  Depression screen PHQ 2/9  Depression screen [facility] 2/9 12/06/2014 10/12/2014  Decreased Interest 3 2  Down, Depressed, Hopeless 3 2  PHQ - 2 Score 6 4  Altered sleeping - 2  Tired, decreased energy - 2  Change in appetite - 1  Feeling bad or failure about yourself  - 1  Trouble concentrating - 0  Moving slowly or fidgety/restless - 0  Suicidal thoughts - 1  PHQ-9 Score - 11     Review of Systems  Constitutional: Positive for appetite change.  Gastrointestinal: Positive for constipation.  All other systems reviewed and are negative.      Objective:   Physical Exam  Constitutional: She is oriented to person, place, and time. She appears well-developed and well-nourished.  HENT:  Head: Normocephalic and atraumatic.  Neck: Normal range of motion. Neck supple.  Cardiovascular: Normal rate and regular rhythm.   Pulmonary/Chest: Effort normal and breath sounds normal.  Musculoskeletal:  Normal Muscle Bulk and Muscle Testing Reveals: Upper Extremities: Right: Decreased ROM 90 Degrees and Muscle Strength 5/5 Right AC Joint Tenderness Left: Full ROM and Muscle Strength 5/5 Thoracic Paraspinal Tenderness: T-1-  T-3 Mainly Right Side Lower Extremities: Full ROM and Muscle Strength 5/5 Arises from chair with ease Narrow Based Gait   Neurological: She is alert and oriented to person, place, and time.  Skin: Skin is warm and dry.  Psychiatric: She has a normal mood and affect.  Nursing note and vitals reviewed.         Assessment & Plan:  1. FMS: When Cleared from surgeon, resume exercise routine. 2. Myalgia/ Myositis: Continue with heat and exercise regime. 3. Chronic Pain Syndrome: Refilled: Hydrocodone 10/325 mg take 2 tablets every 8 hours #180. Second script given. 4. Hx of major depression : Psychology following 5. Right Shoulder Pain: RX: X-ray  30 minutes of face to face patient care time was spent during this visit. All questions were encouraged and answered.   F/U in 2 months

## 2014-12-27 ENCOUNTER — Other Ambulatory Visit: Payer: Self-pay | Admitting: Physical Medicine & Rehabilitation

## 2015-02-01 ENCOUNTER — Encounter: Payer: Self-pay | Admitting: Registered Nurse

## 2015-02-01 ENCOUNTER — Other Ambulatory Visit: Payer: Self-pay | Admitting: Physical Medicine & Rehabilitation

## 2015-02-01 ENCOUNTER — Encounter: Payer: Managed Care, Other (non HMO) | Admitting: Physical Medicine & Rehabilitation

## 2015-02-01 ENCOUNTER — Encounter: Payer: Managed Care, Other (non HMO) | Attending: Physical Medicine & Rehabilitation | Admitting: Registered Nurse

## 2015-02-01 VITALS — BP 112/61 | HR 67 | Resp 14

## 2015-02-01 DIAGNOSIS — M609 Myositis, unspecified: Secondary | ICD-10-CM | POA: Diagnosis present

## 2015-02-01 DIAGNOSIS — M797 Fibromyalgia: Secondary | ICD-10-CM | POA: Diagnosis not present

## 2015-02-01 DIAGNOSIS — G43001 Migraine without aura, not intractable, with status migrainosus: Secondary | ICD-10-CM | POA: Diagnosis present

## 2015-02-01 DIAGNOSIS — G894 Chronic pain syndrome: Secondary | ICD-10-CM | POA: Diagnosis present

## 2015-02-01 DIAGNOSIS — M791 Myalgia: Secondary | ICD-10-CM | POA: Insufficient documentation

## 2015-02-01 DIAGNOSIS — Z79899 Other long term (current) drug therapy: Secondary | ICD-10-CM | POA: Diagnosis present

## 2015-02-01 DIAGNOSIS — Z5181 Encounter for therapeutic drug level monitoring: Secondary | ICD-10-CM | POA: Diagnosis present

## 2015-02-01 DIAGNOSIS — IMO0001 Reserved for inherently not codable concepts without codable children: Secondary | ICD-10-CM

## 2015-02-01 MED ORDER — HYDROCODONE-ACETAMINOPHEN 10-325 MG PO TABS: 2.0000 | ORAL_TABLET | Freq: Three times a day (TID) | ORAL | Status: DC

## 2015-02-01 NOTE — Progress Notes (Signed)
Subjective:    Patient ID: Jenna Molina, female    DOB: 09-29-1955, 59 y.o.   MRN: [id number]  HPI: Ms. Jenna Molina is a 59 year old female who returns for follow up appointment for chronic pain and medication refill. She says her pain is located in her right shoulder and left knee.She rates her pain 8. Her current exercise regime is performing stretching exercises and walking.  Pain Inventory Average Pain 8 Pain Right Now 8 My pain is sharp, burning, stabbing and aching  In the last 24 hours, has pain interfered with the following? General activity 9 Relation with others 9 Enjoyment of life 9 What TIME of day is your pain at its worst? Evening and Night Sleep (in general) Fair  Pain is worse with: walking, bending and sitting Pain improves with: rest, heat/ice, pacing activities and medication Relief from Meds: 6  Mobility Do you have any goals in this area?  no  Function disabled: date disabled NA  Neuro/Psych trouble walking depression  Prior Studies Any changes since last visit?  no  Physicians involved in your care Any changes since last visit?  no   Family History  Problem Relation Age of Onset  . Cancer Mother   . Diabetes Mother   . Anxiety disorder Mother   . Emphysema Father   . Anxiety disorder Father   . Alcohol abuse Father   . COPD Father    Social History   Social History  . Marital Status: Married    Spouse Name: N/A  . Number of Children: N/A  . Years of Education: N/A   Social History Main Topics  . Smoking status: Former Smoker    Quit date: 06/19/2008  . Smokeless tobacco: Never Used  . Alcohol Use: No  . Drug Use: No  . Sexual Activity: Not Asked   Other Topics Concern  . None   Social History Narrative   Past Surgical History  Procedure Laterality Date  . Inner ear surgery    . Abdominal hysterectomy    . Appendectomy    . Mastoidectomy    . Mole removal    . Cholecystectomy N/A 04/01/2014    Procedure: LAPAROSCOPIC  CHOLECYSTECTOMY WITH INTRAOPERATIVE CHOLANGIOGRAM;  Surgeon: Jackolyn Confer, MD;  Location: [facility];  Service: General;  Laterality: N/A;  . Ercp N/A 04/02/2014    Procedure: ENDOSCOPIC RETROGRADE CHOLANGIOPANCREATOGRAPHY (ERCP);  Surgeon: Milus Banister, MD;  Location: [facility];  Service: Gastroenterology;  Laterality: N/A;   Past Medical History  Diagnosis Date  . Migraines   . Fibromyalgia   . Cancer (HCC)     Cervical cancer   BP 112/61 mmHg  Pulse 67  Resp 14  SpO2 98%  Opioid Risk Score:   Fall Risk Score:  `1  Depression screen PHQ 2/9  Depression screen [facility] 2/9 12/06/2014 10/12/2014  Decreased Interest 3 2  Down, Depressed, Hopeless 3 2  PHQ - 2 Score 6 4  Altered sleeping - 2  Tired, decreased energy - 2  Change in appetite - 1  Feeling bad or failure about yourself  - 1  Trouble concentrating - 0  Moving slowly or fidgety/restless - 0  Suicidal thoughts - 1  PHQ-9 Score - 11     Review of Systems  Constitutional: Positive for appetite change.  All other systems reviewed and are negative.      Objective:   Physical Exam  Constitutional: She is oriented to person, place, and time. She appears well-developed  and well-nourished.  HENT:  Head: Normocephalic and atraumatic.  Neck: Normal range of motion. Neck supple.  Cardiovascular: Normal rate and regular rhythm.   Pulmonary/Chest: Effort normal and breath sounds normal.  Musculoskeletal:  Normal Muscle Bulk and Muscle Testing Reveals:  Upper Extremities: Left: Full ROM and Muscle Strength 5/5 Right: Decreased ROM  90 Degrees and Muscle Strength 5/5 Right AC Joint Tenderness Lumbar Paraspinal Tenderness L-4- L-5 Lower Extremities: Full ROM and Muscle Strength 5/5 Arises from chair with ease Narrow Based Gait    Neurological: She is alert and oriented to person, place, and time.  Skin: Skin is warm and dry.  Psychiatric: She has a normal mood and affect.  Nursing note and vitals reviewed.          Assessment & Plan:  1. FMS: Continue HEP as tolerated. 2. Myalgia/ Myositis: Continue with heat and exercise regime. 3. Chronic Pain Syndrome: Refilled: Hydrocodone 10/325 mg take 2 tablets every 8 hours #180. Second script given for the following month. 4. Hx of major depression : Psychology following  20 minutes of face to face patient care time was spent during this visit. All questions were encouraged and answered.   F/U in 2 months

## 2015-02-02 LAB — PMP ALCOHOL METABOLITE (ETG): ETGU: NEGATIVE ng/mL

## 2015-02-04 ENCOUNTER — Encounter: Payer: Self-pay | Admitting: Registered Nurse

## 2015-02-05 LAB — BENZODIAZEPINES (GC/LC/MS), URINE
Alprazolam metabolite (GC/LC/MS), ur confirm: NEGATIVE ng/mL
Clonazepam metabolite (GC/LC/MS), ur confirm: NEGATIVE ng/mL
Flurazepam metabolite (GC/LC/MS), ur confirm: NEGATIVE ng/mL
Lorazepam (GC/LC/MS), ur confirm: 1526 ng/mL — AB
Midazolam (GC/LC/MS), ur confirm: NEGATIVE ng/mL
Nordiazepam (GC/LC/MS), ur confirm: NEGATIVE ng/mL
Oxazepam (GC/LC/MS), ur confirm: NEGATIVE ng/mL
Temazepam (GC/LC/MS), ur confirm: NEGATIVE ng/mL
Triazolam metabolite (GC/LC/MS), ur confirm: NEGATIVE ng/mL

## 2015-02-05 LAB — OPIATES/OPIOIDS (LC/MS-MS)
CODEINE URINE: NEGATIVE ng/mL (ref <50)
HYDROCODONE: 1332 ng/mL (ref <50)
HYDROMORPHONE: 165 ng/mL (ref <50)
MORPHINE: NEGATIVE ng/mL (ref <50)
Norhydrocodone, Ur: 6965 ng/mL (ref <50)
Noroxycodone, Ur: NEGATIVE ng/mL (ref <50)
OXYCODONE, UR: NEGATIVE ng/mL (ref <50)
Oxymorphone: NEGATIVE ng/mL (ref <50)

## 2015-02-08 LAB — PRESCRIPTION MONITORING PROFILE (SOLSTAS)
AMPHETAMINE/METH: NEGATIVE ng/mL
BUPRENORPHINE, URINE: NEGATIVE ng/mL
Barbiturate Screen, Urine: NEGATIVE ng/mL
Cannabinoid Scrn, Ur: NEGATIVE ng/mL
Carisoprodol, Urine: NEGATIVE ng/mL
Cocaine Metabolites: NEGATIVE ng/mL
Creatinine, Urine: 36.98 mg/dL (ref >20.0)
Fentanyl, Ur: NEGATIVE ng/mL
MDMA URINE: NEGATIVE ng/mL
MEPERIDINE UR: NEGATIVE ng/mL
Methadone Screen, Urine: NEGATIVE ng/mL
Nitrites, Initial: NEGATIVE ug/mL
Oxycodone Screen, Ur: NEGATIVE ng/mL
Propoxyphene: NEGATIVE ng/mL
Tapentadol, urine: NEGATIVE ng/mL
Tramadol Scrn, Ur: NEGATIVE ng/mL
Zolpidem, Urine: NEGATIVE ng/mL
pH, Initial: 6.1 pH (ref 4.5–8.9)

## 2015-02-23 NOTE — Progress Notes (Signed)
Urine drug screen for this encounter is consistent for prescribed medication 

## 2015-04-01 ENCOUNTER — Encounter: Payer: Managed Care, Other (non HMO) | Attending: Physical Medicine & Rehabilitation | Admitting: Registered Nurse

## 2015-04-01 ENCOUNTER — Encounter: Payer: Self-pay | Admitting: Registered Nurse

## 2015-04-01 VITALS — BP 92/51 | HR 80 | Resp 14

## 2015-04-01 DIAGNOSIS — M797 Fibromyalgia: Secondary | ICD-10-CM | POA: Diagnosis not present

## 2015-04-01 DIAGNOSIS — M791 Myalgia: Secondary | ICD-10-CM | POA: Diagnosis present

## 2015-04-01 DIAGNOSIS — G43001 Migraine without aura, not intractable, with status migrainosus: Secondary | ICD-10-CM | POA: Diagnosis present

## 2015-04-01 DIAGNOSIS — M609 Myositis, unspecified: Secondary | ICD-10-CM | POA: Insufficient documentation

## 2015-04-01 DIAGNOSIS — Z79899 Other long term (current) drug therapy: Secondary | ICD-10-CM | POA: Insufficient documentation

## 2015-04-01 DIAGNOSIS — IMO0001 Reserved for inherently not codable concepts without codable children: Secondary | ICD-10-CM

## 2015-04-01 DIAGNOSIS — Z5181 Encounter for therapeutic drug level monitoring: Secondary | ICD-10-CM | POA: Diagnosis present

## 2015-04-01 DIAGNOSIS — G894 Chronic pain syndrome: Secondary | ICD-10-CM | POA: Insufficient documentation

## 2015-04-01 DIAGNOSIS — R39198 Other difficulties with micturition: Secondary | ICD-10-CM

## 2015-04-01 MED ORDER — HYDROCODONE-ACETAMINOPHEN 10-325 MG PO TABS: 2.0000 | ORAL_TABLET | Freq: Three times a day (TID) | ORAL | Status: DC

## 2015-04-01 NOTE — Progress Notes (Signed)
Subjective:    Patient ID: Jenna Molina, female    DOB: 09-24-1955, 59 y.o.   MRN: [id number]  HPI: Jenna Molina is a 59 year old female who returns for follow up appointment for chronic pain and medication refill. She states she's having generalized pain all over. She did not rate her pain. Also complaining of infrequent urination most likely related to Topamax. 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 She was instructed to call her PCP she verbalizes understanding. Her current exercise regime is performing stretching exercises and walking.  Pain Inventory Average Pain 10 Pain Right Now NA My pain is sharp, burning, stabbing and aching  In the last 24 hours, has pain interfered with the following? General activity 9 Relation with others 9 Enjoyment of life 9 What TIME of day is your pain at its worst? morning, evening, night Sleep (in general) Fair  Pain is worse with: walking, bending, sitting, standing and some activites Pain improves with: rest, heat/ice, pacing activities, medication and injections Relief from Meds: 6  Mobility do you drive?  yes use a wheelchair Do you have any goals in this area?  no  Function disabled: date disabled 07/2004 I need assistance with the following:  meal prep, household duties and shopping Do you have any goals in this area?  yes  Neuro/Psych depression anxiety  Prior Studies Any changes since last visit?  no  Physicians involved in your care Any changes since last visit?  no   Family History  Problem Relation Age of Onset  . Cancer Mother   . Diabetes Mother   . Anxiety disorder Mother   . Emphysema Father   . Anxiety disorder Father   . Alcohol abuse Father   . COPD Father    Social History   Social History  . Marital Status: Married    Spouse Name: N/A  . Number of Children: N/A  . Years of Education:  N/A   Social History Main Topics  . Smoking status: Former Smoker    Quit date: 06/19/2008  . Smokeless tobacco: Never Used  . Alcohol Use: No  . Drug Use: No  . Sexual Activity: Not Asked   Other Topics Concern  . None   Social History Narrative   Past Surgical History  Procedure Laterality Date  . Inner ear surgery    . Abdominal hysterectomy    . Appendectomy    . Mastoidectomy    . Mole removal    . Cholecystectomy N/A 04/01/2014    Procedure: LAPAROSCOPIC CHOLECYSTECTOMY WITH INTRAOPERATIVE CHOLANGIOGRAM;  Surgeon: Jackolyn Confer, MD;  Location: [facility];  Service: General;  Laterality: N/A;  . Ercp N/A 04/02/2014    Procedure: ENDOSCOPIC RETROGRADE CHOLANGIOPANCREATOGRAPHY (ERCP);  Surgeon: Milus Banister, MD;  Location: [facility];  Service: Gastroenterology;  Laterality: N/A;   Past Medical History  Diagnosis Date  . Migraines   . Fibromyalgia   . Cancer (HCC)     Cervical cancer   BP 92/51 mmHg  Pulse 80  Resp 14  SpO2 97%  Opioid Risk Score:   Fall Risk Score:  `1  Depression screen PHQ 2/9  Depression screen [facility] 2/9 12/06/2014 10/12/2014  Decreased Interest 3 2  Down, Depressed, Hopeless 3 2  PHQ - 2 Score 6 4  Altered sleeping - 2  Tired, decreased energy - 2  Change in appetite -  1  Feeling bad or failure about yourself  - 1  Trouble concentrating - 0  Moving slowly or fidgety/restless - 0  Suicidal thoughts - 1  PHQ-9 Score - 11      Review of Systems  Psychiatric/Behavioral: Positive for dysphoric mood. The patient is nervous/anxious.   All other systems reviewed and are negative.      Objective:   Physical Exam  Constitutional: She is oriented to person, place, and time. She appears well-developed and well-nourished.  HENT:  Head: Normocephalic and atraumatic.  Neck: Normal range of motion. Neck supple.  Cardiovascular: Normal rate and regular rhythm.   Pulmonary/Chest: Effort normal and breath sounds normal.  Musculoskeletal:    Normal Muscle Bulk and Muscle Testing Reveals:  Upper Extremities: Full ROM and Muscle Strength 5/5 Thoracic and Lumbar Hypersensitivity Lower Extremities: Full ROM and Muscle Strength 5/5 Arises from chair with ease Narrow Based gait     Neurological: She is alert and oriented to person, place, and time.  Skin: Skin is warm and dry.  Psychiatric: She has a normal mood and affect.  Nursing note and vitals reviewed.         Assessment & Plan:  1. FMS: Continue HEP as tolerated. 2. Myalgia/ Myositis: Continue with heat and exercise regime. 3. Chronic Pain Syndrome: Refilled: Hydrocodone 10/325 mg take 2 tablets every 8 hours #180. Second script given for the following month. 4. Hx of major depression : Psychology following  20 minutes of face to face patient care time was spent during this visit. All questions were encouraged and answered.   F/U in 2 months

## 2015-04-01 NOTE — Patient Instructions (Addendum)
Call Your PCP ask about decreasing Celexa or Topamax due to Infrequent Urination

## 2015-05-24 ENCOUNTER — Encounter
Payer: Managed Care, Other (non HMO) | Attending: Physical Medicine & Rehabilitation | Admitting: Physical Medicine & Rehabilitation

## 2015-05-24 ENCOUNTER — Encounter: Payer: Self-pay | Admitting: Physical Medicine & Rehabilitation

## 2015-05-24 VITALS — BP 104/67

## 2015-05-24 DIAGNOSIS — Z79899 Other long term (current) drug therapy: Secondary | ICD-10-CM

## 2015-05-24 DIAGNOSIS — G43019 Migraine without aura, intractable, without status migrainosus: Secondary | ICD-10-CM

## 2015-05-24 DIAGNOSIS — G43001 Migraine without aura, not intractable, with status migrainosus: Secondary | ICD-10-CM | POA: Diagnosis present

## 2015-05-24 DIAGNOSIS — M609 Myositis, unspecified: Secondary | ICD-10-CM | POA: Insufficient documentation

## 2015-05-24 DIAGNOSIS — M75101 Unspecified rotator cuff tear or rupture of right shoulder, not specified as traumatic: Secondary | ICD-10-CM

## 2015-05-24 DIAGNOSIS — F32A Depression, unspecified: Secondary | ICD-10-CM

## 2015-05-24 DIAGNOSIS — M791 Myalgia: Secondary | ICD-10-CM

## 2015-05-24 DIAGNOSIS — IMO0001 Reserved for inherently not codable concepts without codable children: Secondary | ICD-10-CM

## 2015-05-24 DIAGNOSIS — G894 Chronic pain syndrome: Secondary | ICD-10-CM | POA: Diagnosis present

## 2015-05-24 DIAGNOSIS — Z5181 Encounter for therapeutic drug level monitoring: Secondary | ICD-10-CM | POA: Insufficient documentation

## 2015-05-24 DIAGNOSIS — F329 Major depressive disorder, single episode, unspecified: Secondary | ICD-10-CM

## 2015-05-24 MED ORDER — HYDROCODONE-ACETAMINOPHEN 10-325 MG PO TABS: 2.0000 | ORAL_TABLET | Freq: Three times a day (TID) | ORAL | Status: DC

## 2015-05-24 NOTE — Patient Instructions (Signed)
PLEASE CALL ME WITH ANY PROBLEMS OR QUESTIONS (#[phone]).     CYMBALTA FOR DEPRESSION/PAIN

## 2015-05-24 NOTE — Addendum Note (Signed)
Addended by: Caro Hight on: 05/24/2015 10:32 AM   Modules accepted: Medications

## 2015-05-24 NOTE — Progress Notes (Signed)
Subjective:    Patient ID: Jenna Molina, female    DOB: [birth year], 60 y.o.   MRN: [id number]  HPI   Jenna Molina is here in follow up of her chronic pain. She continues to deal with stress. She has regular psychological support but lacks a companion or family friend to talk to. She does not see a psychiatrist. Dr. Sabra Heck manages her antidepressants.   She continues on her pain medications as prescribed.    At last visit we injected her right shoulder. She had some relief with the injection but feels that the shoulder pain is moving and now it's more posteriorly.     Pain Inventory Average Pain 6 Pain Right Now 6 My pain is constant and aching  In the last 24 hours, has pain interfered with the following? General activity 7 Relation with others 9 Enjoyment of life 9 What TIME of day is your pain at its worst? daytime and evening Sleep (in general) Poor  Pain is worse with: walking, bending, sitting and standing Pain improves with: heat/ice, medication and injections Relief from Meds: 8  Mobility Do you have any goals in this area?  no  Function Do you have any goals in this area?  no  Neuro/Psych bowel control problems trouble walking depression anxiety  Prior Studies Any changes since last visit?  no  Physicians involved in your care Any changes since last visit?  no   Family History  Problem Relation Age of Onset  . Cancer Mother   . Diabetes Mother   . Anxiety disorder Mother   . Emphysema Father   . Anxiety disorder Father   . Alcohol abuse Father   . COPD Father    Social History   Social History  . Marital Status: Married    Spouse Name: N/A  . Number of Children: N/A  . Years of Education: N/A   Social History Main Topics  . Smoking status: Former Smoker    Quit date: 06/19/2008  . Smokeless tobacco: Never Used  . Alcohol Use: No  . Drug Use: No  . Sexual Activity: Not Asked   Other Topics Concern  . None   Social History Narrative    Past Surgical History  Procedure Laterality Date  . Inner ear surgery    . Abdominal hysterectomy    . Appendectomy    . Mastoidectomy    . Mole removal    . Cholecystectomy N/A 04/01/2014    Procedure: LAPAROSCOPIC CHOLECYSTECTOMY WITH INTRAOPERATIVE CHOLANGIOGRAM;  Surgeon: Jackolyn Confer, MD;  Location: [facility];  Service: General;  Laterality: N/A;  . Ercp N/A 04/02/2014    Procedure: ENDOSCOPIC RETROGRADE CHOLANGIOPANCREATOGRAPHY (ERCP);  Surgeon: Milus Banister, MD;  Location: [facility];  Service: Gastroenterology;  Laterality: N/A;   Past Medical History  Diagnosis Date  . Migraines   . Fibromyalgia   . Cancer (HCC)     Cervical cancer   BP 104/67  Opioid Risk Score:   Fall Risk Score:  `1  Depression screen PHQ 2/9  Depression screen [facility] 2/9 05/24/2015 12/06/2014 10/12/2014  Decreased Interest '3 3 2  '$ Down, Depressed, Hopeless '3 3 2  '$ PHQ - 2 Score '6 6 4  '$ Altered sleeping - - 2  Tired, decreased energy - - 2  Change in appetite - - 1  Feeling bad or failure about yourself  - - 1  Trouble concentrating - - 0  Moving slowly or fidgety/restless - - 0  Suicidal thoughts - - 1  PHQ-9 Score - - 11     Review of Systems  Constitutional: Positive for appetite change.       Poor  Gastrointestinal: Positive for constipation.  All other systems reviewed and are negative.      Objective:   Physical Exam  General: Alert and oriented x 3, No apparent distress  HEENT: Head is normocephalic, atraumatic, PERRLA, EOMI, sclera anicteric, oral mucosa pink and moist, dentition intact, ext ear canals clear,  Neck: Supple without JVD or lymphadenopathy  Heart: Reg rate and rhythm. No murmurs rubs or gallops  Chest: CTA bilaterally without wheezes, rales, or rhonchi; no distress  Abdomen: Slightly distended. Mild epigastric pain with palpation  Extremities: No clubbing, cyanosis, or edema. Pulses are 2+  Skin: Clean and intact without signs of breakdown  Neuro: Pt is  cognitively appropriate with normal insight, memory, and awareness. Cranial nerves 2-12 are intact. Sensory exam is normal. Reflexes are 2+ in all 4's. Fine motor coordination is intact. No tremors. Motor function is grossly 5/5. SLR is negative  Musculoskeletal: good lumbar flexion. Cervical ROM functional. Hamstrings are more flexible. Numerous tender points along knees, greater trochs, PSIS's, low back, elbows, sternum, mid traps, shoulders, . Gait is stable, normal. Pain along upper humeral head/glenoid. Subacromial bursa tender also. Pain with IR/ER of the right shoulder. Pain with palpation of the Teres minor tendon--reproduced.  Psych: Pt was in tears when I entered the room. Had brightened up by the time we were through.   Assessment & Plan:   1. FMS with associated symptoms including: chronic fatigue, depression, migraine headaches, RLS, TMJ, numerous tender points and trigger points.  2. Hx of major depression  3. Hypothyroid  4. Right shoulder pain.  Improved bursitis, rotator cuff. Myofascial pain right infrapinatus  Plan:  1. Refilled hydrocodone 10/325 one to 2 q8 prn #180. I gave her a second rx for next month.  2. After informed consent and preparation of the skin with isopropyl alcohol, I injected the right infraspinatus with 2cc of 1% lidocaine. The patient tolerated well, and no complications were experienced. Post-injection instructions were provided including Pilates. 3. Stress/anxiety mgt remains key. May benefit from a SNRI such as cymbalta---could start at '20mg'$  QD.Marland Kitchen 4. Ongoing counseling.    5. Follow up with me in two months. 15 minutes of face to face patient care time were spent during this visit. All questions were encouraged and answered.

## 2015-05-25 ENCOUNTER — Encounter: Payer: Self-pay | Admitting: Physical Medicine & Rehabilitation

## 2015-05-28 LAB — TOXASSURE SELECT,+ANTIDEPR,UR: PDF: 0

## 2015-05-30 NOTE — Progress Notes (Signed)
Urine drug screen for this encounter is consistent for prescribed medication 

## 2015-06-09 ENCOUNTER — Other Ambulatory Visit: Payer: Self-pay | Admitting: Physical Medicine & Rehabilitation

## 2015-06-22 ENCOUNTER — Encounter: Payer: Self-pay | Admitting: Physical Medicine & Rehabilitation

## 2015-07-11 ENCOUNTER — Encounter (HOSPITAL_COMMUNITY): Payer: Self-pay | Admitting: Emergency Medicine

## 2015-07-11 ENCOUNTER — Emergency Department (HOSPITAL_COMMUNITY)
Admission: EM | Admit: 2015-07-11 | Discharge: 2015-07-12 | Discharge status: Against Medical Advice | Payer: Managed Care, Other (non HMO) | Attending: Emergency Medicine | Admitting: Emergency Medicine

## 2015-07-11 DIAGNOSIS — M791 Myalgia: Secondary | ICD-10-CM | POA: Insufficient documentation

## 2015-07-11 DIAGNOSIS — H53149 Visual discomfort, unspecified: Secondary | ICD-10-CM | POA: Diagnosis not present

## 2015-07-11 DIAGNOSIS — R112 Nausea with vomiting, unspecified: Secondary | ICD-10-CM | POA: Diagnosis not present

## 2015-07-11 DIAGNOSIS — Z79899 Other long term (current) drug therapy: Secondary | ICD-10-CM | POA: Diagnosis not present

## 2015-07-11 DIAGNOSIS — R51 Headache: Secondary | ICD-10-CM | POA: Diagnosis present

## 2015-07-11 DIAGNOSIS — R519 Headache, unspecified: Secondary | ICD-10-CM

## 2015-07-11 DIAGNOSIS — Z8541 Personal history of malignant neoplasm of cervix uteri: Secondary | ICD-10-CM | POA: Diagnosis not present

## 2015-07-11 DIAGNOSIS — R231 Pallor: Secondary | ICD-10-CM | POA: Diagnosis not present

## 2015-07-11 DIAGNOSIS — Z87891 Personal history of nicotine dependence: Secondary | ICD-10-CM | POA: Insufficient documentation

## 2015-07-11 NOTE — ED Notes (Signed)
Pt c/o migraine pain onset 0430am yesterday, +nausea, light and sound sensitivity. Pt reports fibromyalgia pain to back of legs and buttocks.  Pt has taken Imitrex, Relpax and Vicodin with no relief.

## 2015-07-12 DIAGNOSIS — R51 Headache: Secondary | ICD-10-CM | POA: Diagnosis not present

## 2015-07-12 MED ORDER — METOCLOPRAMIDE HCL 5 MG/ML IJ SOLN
10.0000 mg | Freq: Once | INTRAMUSCULAR | Status: AC
  Administered 2015-07-12: 10 mg via INTRAMUSCULAR
  Filled 2015-07-12: qty 2

## 2015-07-12 MED ORDER — DIPHENHYDRAMINE HCL 25 MG PO CAPS
25.0000 mg | ORAL_CAPSULE | Freq: Once | ORAL | Status: AC
  Administered 2015-07-12: 25 mg via ORAL
  Filled 2015-07-12: qty 1

## 2015-07-12 MED ORDER — KETOROLAC TROMETHAMINE 30 MG/ML IJ SOLN
30.0000 mg | Freq: Once | INTRAMUSCULAR | Status: AC
  Administered 2015-07-12: 30 mg via INTRAMUSCULAR
  Filled 2015-07-12: qty 1

## 2015-07-12 NOTE — ED Notes (Signed)
Pt was observed exiting department before discharge orders were placed; pt seen walking steady and without distress.

## 2015-07-12 NOTE — ED Provider Notes (Signed)
CSN: [id number]     Arrival date & time 07/11/15  2212 History   First MD Initiated Contact with Patient 07/12/15 0036     Chief Complaint  Patient presents with  . Migraine     (Consider location/radiation/quality/duration/timing/severity/associated sxs/prior Treatment) HPI Comments: This is a 60 year old female with a history of migraine headaches who presents with 2 days of headache pain despite use of her Relpax and Imitrex.  She dates the pain is  unrelenting, but in typical location.  She states she's been unable to tolerate her normal pain medication for her fibromyalgia, so this is exacerbated as well in her lower back and butt and legs  Patient is a 60 y.o. female presenting with migraines. The history is provided by the patient.  Migraine This is a recurrent problem. The current episode started yesterday. The problem occurs constantly. The problem has been unchanged. Associated symptoms include headaches, myalgias, nausea and vomiting. Pertinent negatives include no chills, fever, neck pain, numbness or weakness. Nothing aggravates the symptoms. She has tried nothing for the symptoms. The treatment provided no relief.    Past Medical History  Diagnosis Date  . Migraines   . Fibromyalgia   . Cancer [facility])     Cervical cancer   Past Surgical History  Procedure Laterality Date  . Inner ear surgery    . Abdominal hysterectomy    . Appendectomy    . Mastoidectomy    . Mole removal    . Cholecystectomy N/A 04/01/2014    Procedure: LAPAROSCOPIC CHOLECYSTECTOMY WITH INTRAOPERATIVE CHOLANGIOGRAM;  Surgeon: Jackolyn Confer, MD;  Location: [facility];  Service: General;  Laterality: N/A;  . Ercp N/A 04/02/2014    Procedure: ENDOSCOPIC RETROGRADE CHOLANGIOPANCREATOGRAPHY (ERCP);  Surgeon: Milus Banister, MD;  Location: [facility];  Service: Gastroenterology;  Laterality: N/A;   Family History  Problem Relation Age of Onset  . Cancer Mother   . Diabetes Mother   . Anxiety disorder Mother    . Emphysema Father   . Anxiety disorder Father   . Alcohol abuse Father   . COPD Father    Social History  Substance Use Topics  . Smoking status: Former Smoker    Quit date: 06/19/2008  . Smokeless tobacco: Never Used  . Alcohol Use: No   OB History    No data available     Review of Systems  Constitutional: Negative for fever and chills.  HENT: Negative for rhinorrhea.   Eyes: Positive for photophobia.  Gastrointestinal: Positive for nausea and vomiting.  Musculoskeletal: Positive for myalgias. Negative for neck pain and neck stiffness.  Neurological: Positive for headaches. Negative for weakness and numbness.  All other systems reviewed and are negative.     Allergies  Aspirin; Lyrica; Sulfa antibiotics; Adhesive; and Neosporin  Home Medications   Prior to Admission medications   Medication Sig Start Date End Date Taking? Authorizing Provider  Acetaminophen 500 MG coapsule Take 250-500 mg by mouth 3 (three) times daily. Takes '250mg'$  at 0030 and 0800 and '500mg'$  at bedtime (1430)    Historical Provider, MD  cholecalciferol (VITAMIN D) 1000 UNITS tablet Take 5,000 Units by mouth every morning.    Historical Provider, MD  citalopram (CELEXA) 40 MG tablet Take 40 mg by mouth at bedtime.  06/21/14   Historical Provider, MD  cyclobenzaprine (FLEXERIL) 10 MG tablet TAKE TWO TABLETS BY MOUTH AT BEDTIME 06/09/15   Meredith Staggers, MD  esomeprazole (NEXIUM) 40 MG capsule Take 80 mg by mouth daily before breakfast.  Historical Provider, MD  HYDROcodone-acetaminophen (NORCO) 10-325 MG tablet Take 2 tablets by mouth every 8 (eight) hours. 05/24/15   Meredith Staggers, MD  levothyroxine (SYNTHROID, LEVOTHROID) 100 MCG tablet Take 100 mcg by mouth every morning.    Historical Provider, MD  LORazepam (ATIVAN) 1 MG tablet Take 1 mg by mouth every 6 (six) hours as needed. For anxiety    Historical Provider, MD  Polyethyl Glycol-Propyl Glycol (SYSTANE OP) Place 2 drops into both eyes daily  as needed (dryness).    Historical Provider, MD  PREMARIN vaginal cream  03/30/15   Historical Provider, MD  promethazine (PHENERGAN) 25 MG tablet Take 25 mg by mouth every 6 (six) hours as needed. With Imitrex For nausea    Historical Provider, MD  simvastatin (ZOCOR) 20 MG tablet Take 20 mg by mouth daily.    Kathyrn Lass, MD  SUMAtriptan (IMITREX) 100 MG tablet Take 100 mg by mouth every 2 (two) hours as needed. For migraine    Historical Provider, MD  topiramate (TOPAMAX) 200 MG tablet Take 200 mg by mouth every morning.    Historical Provider, MD  vitamin E 400 UNIT capsule Take 400 Units by mouth every morning.    Historical Provider, MD   BP 138/85 mmHg  Pulse 105  Temp(Src) 98.4 F (36.9 C) (Oral)  Resp 20  Ht '5\' 4"'$  (1.626 m)  Wt 56.7 kg  BMI 21.45 kg/m2  SpO2 96% Physical Exam  Constitutional: She appears well-developed and well-nourished.  HENT:  Head: Normocephalic.  Right Ear: External ear normal.  Left Ear: External ear normal.  Mouth/Throat: Oropharynx is clear and moist.  Eyes: Pupils are equal, round, and reactive to light.  Neck: Normal range of motion.  Cardiovascular: Normal rate and regular rhythm.   Pulmonary/Chest: Effort normal and breath sounds normal.  Musculoskeletal: Normal range of motion.  Lymphadenopathy:    She has no cervical adenopathy.  Neurological: She is alert.  Skin: Skin is warm and dry. There is pallor.  Nursing note and vitals reviewed.   ED Course  Procedures (including critical care time) Labs Review Labs Reviewed - No data to display  Imaging Review No results found. I have personally reviewed and evaluated these images and lab results as part of my medical decision-making.   EKG Interpretation None      MDM   Final diagnoses:  Nonintractable headache, unspecified chronicity pattern, unspecified headache type         Junius Creamer, NP 07/16/15 [place], MD 07/21/15 [phone]

## 2015-07-20 ENCOUNTER — Encounter: Payer: Self-pay | Admitting: Physical Medicine & Rehabilitation

## 2015-07-20 ENCOUNTER — Encounter: Payer: Medicare Other | Attending: Physical Medicine & Rehabilitation | Admitting: Physical Medicine & Rehabilitation

## 2015-07-20 VITALS — BP 103/65 | HR 77 | Resp 14

## 2015-07-20 DIAGNOSIS — M609 Myositis, unspecified: Secondary | ICD-10-CM | POA: Diagnosis present

## 2015-07-20 DIAGNOSIS — G43001 Migraine without aura, not intractable, with status migrainosus: Secondary | ICD-10-CM | POA: Insufficient documentation

## 2015-07-20 DIAGNOSIS — M791 Myalgia: Secondary | ICD-10-CM

## 2015-07-20 DIAGNOSIS — Z5181 Encounter for therapeutic drug level monitoring: Secondary | ICD-10-CM | POA: Diagnosis present

## 2015-07-20 DIAGNOSIS — G43009 Migraine without aura, not intractable, without status migrainosus: Secondary | ICD-10-CM

## 2015-07-20 DIAGNOSIS — G894 Chronic pain syndrome: Secondary | ICD-10-CM | POA: Insufficient documentation

## 2015-07-20 DIAGNOSIS — Z79899 Other long term (current) drug therapy: Secondary | ICD-10-CM | POA: Insufficient documentation

## 2015-07-20 DIAGNOSIS — IMO0001 Reserved for inherently not codable concepts without codable children: Secondary | ICD-10-CM

## 2015-07-20 MED ORDER — HYDROCODONE-ACETAMINOPHEN 10-325 MG PO TABS: 2.0000 | ORAL_TABLET | Freq: Three times a day (TID) | ORAL | Status: DC

## 2015-07-20 MED ORDER — SUMATRIPTAN SUCCINATE 6 MG/0.5ML ~~LOC~~ SOLN: 6.0000 mg | SUBCUTANEOUS | Status: AC | PRN

## 2015-07-20 NOTE — Patient Instructions (Signed)
  PLEASE CALL ME WITH ANY PROBLEMS OR QUESTIONS (#[phone]).      

## 2015-07-20 NOTE — Progress Notes (Signed)
Subjective:    Patient ID: Jenna Molina, female    DOB: [birth year], 60 y.o.   MRN: [id number]  HPI  Sandi is here in follow up of her chronic pain. Her neck and shoulders have been better in general. However, she was in the ED last week with a severe migraine that her imitrex and relpax didn't help. She was given toradol apparently which provided some relief. She feels that she is just now getting over the migraine.  Artemisa admits to a lot of stresses at home with a sick brother and her husband having just lost his job. Her migraine took place amidst these events.   She continues to try maintain physical and leisure activities. She does her stretches. She is ready to get out in the garden now that the weather is breaking.  She remains on hydrocodone as prescribed by me.   Pain Inventory Average Pain 4 Pain Right Now 3 My pain is constant, burning, dull, stabbing and aching  In the last 24 hours, has pain interfered with the following? General activity 6 Relation with others 3 Enjoyment of life 6 What TIME of day is your pain at its worst? evening, night Sleep (in general) Fair  Pain is worse with: bending, sitting, standing and some activites Pain improves with: rest, heat/ice, therapy/exercise, pacing activities, medication and injections Relief from Meds: 7  Mobility walk without assistance how many minutes can you walk? 10 ability to climb steps?  yes do you drive?  yes Do you have any goals in this area?  yes  Function disabled: date disabled . I need assistance with the following:  meal prep, household duties and shopping Do you have any goals in this area?  no  Neuro/Psych bladder control problems bowel control problems tremor dizziness depression anxiety  Prior Studies Any changes since last visit?  no  Physicians involved in your care Any changes since last visit?  no   Family History  Problem Relation Age of Onset  . Cancer Mother   . Diabetes  Mother   . Anxiety disorder Mother   . Emphysema Father   . Anxiety disorder Father   . Alcohol abuse Father   . COPD Father    Social History   Social History  . Marital Status: Married    Spouse Name: N/A  . Number of Children: N/A  . Years of Education: N/A   Social History Main Topics  . Smoking status: Former Smoker    Quit date: 06/19/2008  . Smokeless tobacco: Never Used  . Alcohol Use: No  . Drug Use: No  . Sexual Activity: Not Asked   Other Topics Concern  . None   Social History Narrative   Past Surgical History  Procedure Laterality Date  . Inner ear surgery    . Abdominal hysterectomy    . Appendectomy    . Mastoidectomy    . Mole removal    . Cholecystectomy N/A 04/01/2014    Procedure: LAPAROSCOPIC CHOLECYSTECTOMY WITH INTRAOPERATIVE CHOLANGIOGRAM;  Surgeon: Jackolyn Confer, MD;  Location: [facility];  Service: General;  Laterality: N/A;  . Ercp N/A 04/02/2014    Procedure: ENDOSCOPIC RETROGRADE CHOLANGIOPANCREATOGRAPHY (ERCP);  Surgeon: Milus Banister, MD;  Location: [facility];  Service: Gastroenterology;  Laterality: N/A;   Past Medical History  Diagnosis Date  . Migraines   . Fibromyalgia   . Cancer (HCC)     Cervical cancer   BP 103/65 mmHg  Pulse 77  Resp 14  SpO2  94%  Opioid Risk Score:   Fall Risk Score:  `1  Depression screen PHQ 2/9  Depression screen [facility] 2/9 07/20/2015 05/24/2015 12/06/2014 10/12/2014  Decreased Interest '1 3 3 2  '$ Down, Depressed, Hopeless '1 3 3 2  '$ PHQ - 2 Score '2 6 6 4  '$ Altered sleeping 0 - - 2  Tired, decreased energy 1 - - 2  Change in appetite 2 - - 1  Feeling bad or failure about yourself  0 - - 1  Trouble concentrating 0 - - 0  Moving slowly or fidgety/restless 0 - - 0  Suicidal thoughts 0 - - 1  PHQ-9 Score 5 - - 11     Review of Systems     Objective:   Physical Exam  General: Alert and oriented x 3, No apparent distress  HEENT: Head is normocephalic, atraumatic, PERRLA, EOMI, sclera anicteric, oral  mucosa pink and moist, dentition intact, ext ear canals clear,  Neck: Supple without JVD or lymphadenopathy  Heart: Reg rate and rhythm. No murmurs rubs or gallops  Chest: CTA bilaterally without wheezes, rales, or rhonchi; no distress  Abdomen: Slightly distended. Mild epigastric pain with palpation  Extremities: No clubbing, cyanosis, or edema. Pulses are 2+  Skin: Clean and intact without signs of breakdown  Neuro: Pt is cognitively appropriate with normal insight, memory, and awareness. Cranial nerves 2-12 are intact. Sensory exam is normal. Reflexes are 2+ in all 4's. Fine motor coordination is intact. No tremors. Motor function is grossly 5/5. SLR is negative  Musculoskeletal: good lumbar flexion. Cervical ROM functional without spasms and focal pain with palpation. Hamstrings are more flexible. Numerous tender points along knees, greater trochs, PSIS's, low back, elbows, sternum, mid traps, shoulders, . Gait is stable, normal. Minimal pain with shoulder ROM. Psych: Pt was in tears when I entered the room. Had brightened up by the time we were through.   Assessment & Plan:   1. FMS with associated symptoms including: chronic fatigue, depression, migraine headaches, RLS, TMJ, numerous tender points and trigger points.   2. Hx of major depression  3. Hypothyroid      Plan:  1. Refilled hydrocodone 10/325 one to 2 q8 prn #180. I gave her a second rx for next month.  2. Will provide sq imitrex for severe, breakthrough migraine treatment. Use maxalt for initial oral treatment of migraine. Stop oral imitrex.  3. Stress/anxiety mgt remains key. Would benefit from a SNRI such as cymbalta---consider starting at '20mg'$  QD. We discussed the fact that her headache likely arose out of the increased levels of stress at home. 4. Ongoing counseling.  5. Follow up with me in two months. 15 minutes of face to face patient care time were spent during this visit. All questions were encouraged and answered.

## 2015-07-25 ENCOUNTER — Encounter: Payer: Self-pay | Admitting: Physical Medicine & Rehabilitation

## 2015-07-25 NOTE — Telephone Encounter (Signed)
May fill early. Can discuss further at follow up

## 2015-07-25 NOTE — Telephone Encounter (Signed)
Is an early refill okay?

## 2015-09-14 ENCOUNTER — Ambulatory Visit: Payer: Medicare Other | Admitting: Physical Medicine & Rehabilitation

## 2015-09-20 ENCOUNTER — Encounter: Payer: Medicare Other | Attending: Physical Medicine & Rehabilitation | Admitting: Physical Medicine & Rehabilitation

## 2015-09-20 ENCOUNTER — Encounter: Payer: Self-pay | Admitting: Physical Medicine & Rehabilitation

## 2015-09-20 VITALS — BP 109/65 | HR 88 | Resp 14

## 2015-09-20 DIAGNOSIS — G43001 Migraine without aura, not intractable, with status migrainosus: Secondary | ICD-10-CM | POA: Diagnosis not present

## 2015-09-20 DIAGNOSIS — M609 Myositis, unspecified: Secondary | ICD-10-CM | POA: Insufficient documentation

## 2015-09-20 DIAGNOSIS — M791 Myalgia: Secondary | ICD-10-CM | POA: Insufficient documentation

## 2015-09-20 DIAGNOSIS — IMO0001 Reserved for inherently not codable concepts without codable children: Secondary | ICD-10-CM

## 2015-09-20 DIAGNOSIS — G894 Chronic pain syndrome: Secondary | ICD-10-CM | POA: Diagnosis not present

## 2015-09-20 DIAGNOSIS — Z79899 Other long term (current) drug therapy: Secondary | ICD-10-CM | POA: Diagnosis not present

## 2015-09-20 DIAGNOSIS — Z5181 Encounter for therapeutic drug level monitoring: Secondary | ICD-10-CM

## 2015-09-20 MED ORDER — HYDROCODONE-ACETAMINOPHEN 10-325 MG PO TABS: 2.0000 | ORAL_TABLET | Freq: Three times a day (TID) | ORAL | Status: DC

## 2015-09-20 NOTE — Progress Notes (Signed)
Subjective:    Patient ID: Jenna Molina, female    DOB: 1955-05-23, 60 y.o.   MRN: [id number]  HPI   Jenna Molina is here in follow up of her chronic pain/FMS. She states that she is dealing with a "whole lot of problems" but is trying to be positive about it. She stays active in her garden and stretches daily.   She has started drinking prune juice which has helped her IBS.    Pain Inventory Average Pain 5 Pain Right Now 7 My pain is sharp, burning, stabbing and aching  In the last 24 hours, has pain interfered with the following? General activity 7 Relation with others 4 Enjoyment of life 6 What TIME of day is your pain at its worst? evening Sleep (in general) Fair  Pain is worse with: bending, sitting, inactivity, standing and some activites Pain improves with: rest, heat/ice, pacing activities and medication Relief from Meds: 8  Mobility how many minutes can you walk? unsure ability to climb steps?  yes do you drive?  yes Do you have any goals in this area?  yes  Function disabled: date disabled 2006 I need assistance with the following:  meal prep, household duties and shopping Do you have any goals in this area?  no  Neuro/Psych No problems in this area  Prior Studies Any changes since last visit?  no  Physicians involved in your care Any changes since last visit?  no   Family History  Problem Relation Age of Onset  . Cancer Mother   . Diabetes Mother   . Anxiety disorder Mother   . Emphysema Father   . Anxiety disorder Father   . Alcohol abuse Father   . COPD Father    Social History   Social History  . Marital Status: Married    Spouse Name: N/A  . Number of Children: N/A  . Years of Education: N/A   Social History Main Topics  . Smoking status: Former Smoker    Quit date: 06/19/2008  . Smokeless tobacco: Never Used  . Alcohol Use: No  . Drug Use: No  . Sexual Activity: Not on file   Other Topics Concern  . Not on file   Social History  Narrative   Past Surgical History  Procedure Laterality Date  . Inner ear surgery    . Abdominal hysterectomy    . Appendectomy    . Mastoidectomy    . Mole removal    . Cholecystectomy N/A 04/01/2014    Procedure: LAPAROSCOPIC CHOLECYSTECTOMY WITH INTRAOPERATIVE CHOLANGIOGRAM;  Surgeon: Jackolyn Confer, MD;  Location: [facility];  Service: General;  Laterality: N/A;  . Ercp N/A 04/02/2014    Procedure: ENDOSCOPIC RETROGRADE CHOLANGIOPANCREATOGRAPHY (ERCP);  Surgeon: Milus Banister, MD;  Location: [facility];  Service: Gastroenterology;  Laterality: N/A;   Past Medical History  Diagnosis Date  . Migraines   . Fibromyalgia   . Cancer ([place])     Cervical cancer   There were no vitals taken for this visit.  Opioid Risk Score:   Fall Risk Score:  `1  Depression screen PHQ 2/9  Depression screen [facility] 2/9 07/20/2015 05/24/2015 12/06/2014 10/12/2014  Decreased Interest '1 3 3 2  '$ Down, Depressed, Hopeless '1 3 3 2  '$ PHQ - 2 Score '2 6 6 4  '$ Altered sleeping 0 - - 2  Tired, decreased energy 1 - - 2  Change in appetite 2 - - 1  Feeling bad or failure about yourself  0 - -  1  Trouble concentrating 0 - - 0  Moving slowly or fidgety/restless 0 - - 0  Suicidal thoughts 0 - - 1  PHQ-9 Score 5 - - 11       Review of Systems  All other systems reviewed and are negative.      Objective:   Physical Exam  General: Alert and oriented x 3, No apparent distress  HEENT: Head is normocephalic, atraumatic, PERRLA, EOMI, sclera anicteric, oral mucosa pink and moist, dentition intact, ext ear canals clear,  Neck: Supple without JVD or lymphadenopathy  Heart: Reg rate and rhythm. No murmurs rubs or gallops  Chest: CTA bilaterally without wheezes, rales, or rhonchi; no distress  Abdomen: Slightly distended. Mild epigastric pain with palpation  Extremities: No clubbing, cyanosis, or edema. Pulses are 2+  Skin: Clean and intact without signs of breakdown  Neuro: Pt is cognitively appropriate with normal  insight, memory, and awareness. Cranial nerves 2-12 are intact. Sensory exam is normal. Reflexes are 2+ in all 4's. Fine motor coordination is intact. No tremors. Motor function is grossly 5/5. SLR is negative  Musculoskeletal: good lumbar flexion. Cervical ROM functional without spasms and focal pain with palpation. Hamstrings are more flexible. Numerous tender points along knees, greater trochs, PSIS's, low back, elbows, sternum, mid traps, shoulders, . Gait is stable, normal. Minimal pain with shoulder ROM.  Psych: Pt was in tears when I entered the room. Had brightened up by the time we were through.    Assessment & Plan:   1. FMS with associated symptoms including: chronic fatigue, depression, migraine headaches, RLS, TMJ, numerous tender points and trigger points.  2. Hx of major depression  3. Hypothyroid    Plan:  1. Refilled hydrocodone 10/325 one to 2 q8 prn #180. I gave her a second rx for next month.  2. sq imitrex for severe, breakthrough migraine treatment. Use maxalt for initial oral treatment of migraine.  3. Stress/anxiety mgt remains key.  Celexa 4. Discussed oral supplements for pain, joint health. Discussed increased aeorbic exercise 5. Follow up with me in two months. 15 minutes of face to face patient care time were spent during this visit. All questions were encouraged and answered.

## 2015-09-20 NOTE — Patient Instructions (Signed)
TURMERIC, TART CHERRY (EXTRACT, GARLIC, CELERY SALT FOR PAIN   WORK ON REGULAR, AEROBIC EXERCISE

## 2015-09-22 ENCOUNTER — Encounter: Payer: Self-pay | Admitting: Physical Medicine & Rehabilitation

## 2015-09-28 LAB — TOXASSURE SELECT,+ANTIDEPR,UR: PDF: 0

## 2015-10-06 NOTE — Progress Notes (Addendum)
Urine drug screen for this encounter is consistent for prescribed medication 

## 2015-11-09 ENCOUNTER — Encounter: Payer: Self-pay | Admitting: Physical Medicine & Rehabilitation

## 2015-11-11 ENCOUNTER — Other Ambulatory Visit: Payer: Self-pay | Admitting: Physical Medicine & Rehabilitation

## 2015-11-15 ENCOUNTER — Encounter: Payer: Medicare Other | Attending: Physical Medicine & Rehabilitation | Admitting: Physical Medicine & Rehabilitation

## 2015-11-15 ENCOUNTER — Encounter: Payer: Self-pay | Admitting: Physical Medicine & Rehabilitation

## 2015-11-15 VITALS — BP 116/75 | HR 87

## 2015-11-15 DIAGNOSIS — R5382 Chronic fatigue, unspecified: Secondary | ICD-10-CM | POA: Diagnosis not present

## 2015-11-15 DIAGNOSIS — M791 Myalgia: Secondary | ICD-10-CM | POA: Diagnosis not present

## 2015-11-15 DIAGNOSIS — M75101 Unspecified rotator cuff tear or rupture of right shoulder, not specified as traumatic: Secondary | ICD-10-CM | POA: Diagnosis not present

## 2015-11-15 DIAGNOSIS — Z79899 Other long term (current) drug therapy: Secondary | ICD-10-CM | POA: Diagnosis present

## 2015-11-15 DIAGNOSIS — R5381 Other malaise: Secondary | ICD-10-CM

## 2015-11-15 DIAGNOSIS — Z5181 Encounter for therapeutic drug level monitoring: Secondary | ICD-10-CM | POA: Insufficient documentation

## 2015-11-15 DIAGNOSIS — G43001 Migraine without aura, not intractable, with status migrainosus: Secondary | ICD-10-CM | POA: Diagnosis present

## 2015-11-15 DIAGNOSIS — G894 Chronic pain syndrome: Secondary | ICD-10-CM | POA: Insufficient documentation

## 2015-11-15 DIAGNOSIS — M609 Myositis, unspecified: Secondary | ICD-10-CM | POA: Insufficient documentation

## 2015-11-15 DIAGNOSIS — IMO0001 Reserved for inherently not codable concepts without codable children: Secondary | ICD-10-CM

## 2015-11-15 MED ORDER — HYDROCODONE-ACETAMINOPHEN 10-325 MG PO TABS: 2.0000 | ORAL_TABLET | Freq: Three times a day (TID) | ORAL | 0 refills | Status: DC

## 2015-11-15 NOTE — Patient Instructions (Signed)
CALCIUM '1000MG'$  DAILY WITH YOUR VITAMIN D.  CHECK WITH YOUR PRIMARY PHYSICIAN ABOUT PROLIA  CONTINUE WITH WEIGHT BEARING AND RESISTANCE EXERCISE AS YOU CAN TOLERATE  PLEASE CALL ME WITH ANY PROBLEMS OR QUESTIONS ([phone])

## 2015-11-15 NOTE — Progress Notes (Signed)
Subjective:    Patient ID: [name], female    DOB: [birth year], 61 y.o.   MRN: [id number]  HPI Jenna Molina is here in follow up of her chronic pain. For the most part she's been feeling well. Her left hip has been bothering her recently. She has "osteoporosis" in the left hip and the left hip was injured while dancing years ago.  Her right shoulder is bothersome but tolerable. She continues with her exercises as well. She sleeps less on her back.   Hydrocodone continues to work for her pain. She has been compliant with our program.     Pain Inventory Average Pain 5 Pain Right Now 5 My pain is constant, sharp, burning, dull, stabbing and aching  In the last 24 hours, has pain interfered with the following? General activity 7 Relation with others 7 Enjoyment of life 7 What TIME of day is your pain at its worst? morning, evening  Sleep (in general) Fair  Pain is worse with: sitting, standing and some activites Pain improves with: rest, heat/ice, therapy/exercise, pacing activities, medication and injections Relief from Meds: 8  Mobility walk without assistance how many minutes can you walk? 30 ability to climb steps?  yes do you drive?  yes Do you have any goals in this area?  yes  Function disabled: date disabled . I need assistance with the following:  household duties and shopping Do you have any goals in this area?  no  Neuro/Psych bowel control problems depression anxiety  Prior Studies Any changes since last visit?  no  Physicians involved in your care Any changes since last visit?  no   Family History  Problem Relation Age of Onset  . Cancer Mother   . Diabetes Mother   . Anxiety disorder Mother   . Emphysema Father   . Anxiety disorder Father   . Alcohol abuse Father   . COPD Father    Social History   Social History  . Marital status: Married    Spouse name: N/A  . Number of children: N/A  . Years of education: N/A   Social History Main  Topics  . Smoking status: Former Smoker    Quit date: 06/19/2008  . Smokeless tobacco: Never Used  . Alcohol use No  . Drug use: No  . Sexual activity: Not Asked   Other Topics Concern  . None   Social History Narrative  . None   Past Surgical History:  Procedure Laterality Date  . ABDOMINAL HYSTERECTOMY    . APPENDECTOMY    . CHOLECYSTECTOMY N/A 04/01/2014   Procedure: LAPAROSCOPIC CHOLECYSTECTOMY WITH INTRAOPERATIVE CHOLANGIOGRAM;  Surgeon: Jackolyn Confer, MD;  Location: [facility];  Service: General;  Laterality: N/A;  . ERCP N/A 04/02/2014   Procedure: ENDOSCOPIC RETROGRADE CHOLANGIOPANCREATOGRAPHY (ERCP);  Surgeon: Milus Banister, MD;  Location: [facility];  Service: Gastroenterology;  Laterality: N/A;  . INNER EAR SURGERY    . MASTOIDECTOMY    . MOLE REMOVAL     Past Medical History:  Diagnosis Date  . Cancer [facility])    Cervical cancer  . Fibromyalgia   . Migraines    BP 116/75 (BP Location: Left Arm, Patient Position: Sitting, Cuff Size: Normal)   Pulse 87   SpO2 95%   Opioid Risk Score:   Fall Risk Score:  `1  Depression screen PHQ 2/9  Depression screen [facility] 2/9 09/20/2015 07/20/2015 05/24/2015 12/06/2014 10/12/2014  Decreased Interest 0 '1 3 3 2  '$ Down, Depressed, Hopeless 0 1 3  3 2  PHQ - 2 Score 0 '2 6 6 4  '$ Altered sleeping - 0 - - 2  Tired, decreased energy - 1 - - 2  Change in appetite - 2 - - 1  Feeling bad or failure about yourself  - 0 - - 1  Trouble concentrating - 0 - - 0  Moving slowly or fidgety/restless - 0 - - 0  Suicidal thoughts - 0 - - 1  PHQ-9 Score - 5 - - 11    Review of Systems  Constitutional: Negative.   HENT: Negative.   Eyes: Negative.   Respiratory: Negative.   Cardiovascular: Negative.   Gastrointestinal: Positive for constipation.  Endocrine: Negative.   Genitourinary: Negative.   Musculoskeletal: Positive for back pain.  Skin: Negative.   Allergic/Immunologic: Negative.   Neurological: Negative.   Hematological: Negative.     Psychiatric/Behavioral: Positive for dysphoric mood. The patient is nervous/anxious.   All other systems reviewed and are negative.      Objective:   Physical Exam  General: Alert and oriented x 3, No apparent distress  HEENT: Head is normocephalic, atraumatic, PERRLA, EOMI, sclera anicteric, oral mucosa pink and moist, dentition intact, ext ear canals clear,  Neck: Supple without JVD or lymphadenopathy  Heart: Reg rate and rhythm. No murmurs rubs or gallops  Chest: CTA bilaterally without wheezes, rales, or rhonchi; no distress  Abdomen: Slightly distended. Mild epigastric pain with palpation  Extremities: No clubbing, cyanosis, or edema. Pulses are 2+  Skin: Clean and intact without signs of breakdown  Neuro: Pt is cognitively appropriate with normal insight, memory, and awareness. Cranial nerves 2-12 are intact. Sensory exam is normal. Reflexes are 2+ in all 4's. Fine motor coordination is intact. No tremors. Motor function is grossly 5/5. SLR is negative  Musculoskeletal: good lumbar flexion. Mild pain at left PSIS and in glutes. Greater troch with mild tenderness.. Cervical ROM functional without spasms and focal pain with palpation. Hamstrings are more flexible. Numerous tender points along knees, greater trochs, low back, elbows, sternum, mid traps, shoulders, . Gait is stable, normal. Minimal pain with shoulder ROM.  Psych: Pt in good spirits.    Assessment & Plan:   1. FMS with associated symptoms including: chronic fatigue, depression, migraine headaches, RLS, TMJ, numerous tender points and trigger points.  2. Hx of major depression  3. Hypothyroid    Plan:  1. Refilled hydrocodone 10/325 one to 2 q8 prn #180. I gave her a second rx for next month.  2. sq imitrex for severe, breakthrough migraine treatment. Use maxalt for initial oral treatment of migraine.  3. Stress/anxiety mgt remains key.  Celexa 4. Continue with oral supplements. Calcium/VITAMIN d. cpontinue  exercise/resistance exercise 5. Follow up with me in two months. 15 minutes of face to face patient care time were spent during this visit. All questions were encouraged and answered.

## 2015-11-29 ENCOUNTER — Ambulatory Visit: Payer: Managed Care, Other (non HMO) | Admitting: Physical Medicine & Rehabilitation

## 2015-12-07 ENCOUNTER — Other Ambulatory Visit: Payer: Self-pay | Admitting: Physical Medicine & Rehabilitation

## 2015-12-09 ENCOUNTER — Encounter: Payer: Self-pay | Admitting: Physical Medicine & Rehabilitation

## 2016-01-11 ENCOUNTER — Encounter: Payer: Self-pay | Admitting: Physical Medicine & Rehabilitation

## 2016-01-11 ENCOUNTER — Encounter: Payer: Medicare Other | Attending: Physical Medicine & Rehabilitation | Admitting: Physical Medicine & Rehabilitation

## 2016-01-11 VITALS — BP 110/71 | HR 82 | Resp 14

## 2016-01-11 DIAGNOSIS — G43001 Migraine without aura, not intractable, with status migrainosus: Secondary | ICD-10-CM

## 2016-01-11 DIAGNOSIS — M791 Myalgia: Secondary | ICD-10-CM | POA: Diagnosis not present

## 2016-01-11 DIAGNOSIS — M609 Myositis, unspecified: Secondary | ICD-10-CM

## 2016-01-11 DIAGNOSIS — Z79899 Other long term (current) drug therapy: Secondary | ICD-10-CM | POA: Diagnosis present

## 2016-01-11 DIAGNOSIS — IMO0001 Reserved for inherently not codable concepts without codable children: Secondary | ICD-10-CM

## 2016-01-11 DIAGNOSIS — M75101 Unspecified rotator cuff tear or rupture of right shoulder, not specified as traumatic: Secondary | ICD-10-CM

## 2016-01-11 DIAGNOSIS — Z5181 Encounter for therapeutic drug level monitoring: Secondary | ICD-10-CM | POA: Insufficient documentation

## 2016-01-11 DIAGNOSIS — G894 Chronic pain syndrome: Secondary | ICD-10-CM | POA: Insufficient documentation

## 2016-01-11 DIAGNOSIS — R42 Dizziness and giddiness: Secondary | ICD-10-CM | POA: Diagnosis not present

## 2016-01-11 MED ORDER — HYDROCODONE-ACETAMINOPHEN 10-325 MG PO TABS: 2.0000 | ORAL_TABLET | Freq: Three times a day (TID) | ORAL | 0 refills | Status: DC

## 2016-01-11 NOTE — Patient Instructions (Signed)
PLEASE CALL ME WITH ANY PROBLEMS OR QUESTIONS ([phone])  

## 2016-01-11 NOTE — Progress Notes (Signed)
Subjective:    Patient ID: Jenna Molina, female    DOB: Feb 02, 1956, 60 y.o.   MRN: [id number]  HPI   Jenna Molina is here in follow up of her chronic pain. She complains of dizziness when she stands. It is a fuzziness or head fullness sometimes associated with headache. It bothers her when she lays down too. She denies loss of balance/ fever. She does have occasional nausea. Her hearing is intact. She still has regular stressors at home which is common place for her.   Her pain overall is fairly well controlled. Migraines have been minimal as of late.    Pain Inventory Average Pain 4 Pain Right Now 6 My pain is sharp, burning, dull, stabbing and aching  In the last 24 hours, has pain interfered with the following? General activity 8 Relation with others 9 Enjoyment of life 9 What TIME of day is your pain at its worst? evening Sleep (in general) NA  Pain is worse with: walking, bending, sitting, standing and some activites Pain improves with: rest, heat/ice, therapy/exercise, pacing activities, medication and injections Relief from Meds: 8  Mobility walk without assistance how many minutes can you walk? 10 do you drive?  yes  Function disabled: date disabled 2006 I need assistance with the following:  meal prep, household duties and shopping  Neuro/Psych dizziness  Prior Studies Any changes since last visit?  no  Physicians involved in your care Any changes since last visit?  no   Family History  Problem Relation Age of Onset  . Cancer Mother   . Diabetes Mother   . Anxiety disorder Mother   . Emphysema Father   . Anxiety disorder Father   . Alcohol abuse Father   . COPD Father    Social History   Social History  . Marital status: Married    Spouse name: N/A  . Number of children: N/A  . Years of education: N/A   Social History Main Topics  . Smoking status: Former Smoker    Quit date: 06/19/2008  . Smokeless tobacco: Never Used  . Alcohol use No  .  Drug use: No  . Sexual activity: Not Asked   Other Topics Concern  . None   Social History Narrative  . None   Past Surgical History:  Procedure Laterality Date  . ABDOMINAL HYSTERECTOMY    . APPENDECTOMY    . CHOLECYSTECTOMY N/A 04/01/2014   Procedure: LAPAROSCOPIC CHOLECYSTECTOMY WITH INTRAOPERATIVE CHOLANGIOGRAM;  Surgeon: Jackolyn Confer, MD;  Location: [facility];  Service: General;  Laterality: N/A;  . ERCP N/A 04/02/2014   Procedure: ENDOSCOPIC RETROGRADE CHOLANGIOPANCREATOGRAPHY (ERCP);  Surgeon: Milus Banister, MD;  Location: [facility];  Service: Gastroenterology;  Laterality: N/A;  . INNER EAR SURGERY    . MASTOIDECTOMY    . MOLE REMOVAL     Past Medical History:  Diagnosis Date  . Cancer [facility])    Cervical cancer  . Fibromyalgia   . Migraines    BP 110/71   Pulse 82   Resp 14   SpO2 94%   Opioid Risk Score:   Fall Risk Score:  `1  Depression screen PHQ 2/9  Depression screen [facility] 2/9 01/11/2016 09/20/2015 07/20/2015 05/24/2015 12/06/2014 10/12/2014  Decreased Interest 2 0 '1 3 3 2  '$ Down, Depressed, Hopeless 2 0 '1 3 3 2  '$ PHQ - 2 Score 4 0 '2 6 6 4  '$ Altered sleeping - - 0 - - 2  Tired, decreased energy - - 1 - -  2  Change in appetite - - 2 - - 1  Feeling bad or failure about yourself  - - 0 - - 1  Trouble concentrating - - 0 - - 0  Moving slowly or fidgety/restless - - 0 - - 0  Suicidal thoughts - - 0 - - 1  PHQ-9 Score - - 5 - - 11    Review of Systems  All other systems reviewed and are negative.      Objective:   Physical Exam  General: Alert and oriented x 3, No apparent distress  HEENT:Head is normocephalic, atraumatic, PERRLA, EOMI, sclera anicteric, oral mucosa pink and moist, dentition intact, ext ear canals with wax---no obvious tympanic disruption,  Neck:Supple without JVD or lymphadenopathy  Heart:Reg rate and rhythm. No murmurs rubs or gallops  Chest:CTA bilaterally without wheezes, rales, or rhonchi; no distress  Abdomen:Slightly distended.  Mild epigastric pain with palpation  Extremities:No clubbing, cyanosis, or edema. Pulses are 2+  Skin:Clean and intact without signs of breakdown  Neuro:Pt is cognitively appropriate with normal insight, memory, and awareness. Cranial nerves 2-12 are intact. Sensory exam is normal. Reflexes are 2+ in all 4's. Fine motor coordination is intact. No tremors. Motor function is grossly 5/5. SLR is negative  Musculoskeletal:good lumbar flexion. Mild pain at left PSIS and in glutes. Greater troch with mild tenderness.. Cervical ROM functional without spasms and focal pain with palpation. Hamstrings are more flexible. Numerous tender points along knees, greater trochs, low back, elbows, sternum, mid traps, shoulders, . Gait is stable, normal. Minimal pain with shoulder ROM.  Psych:Pt in good spirits.    Assessment & Plan:  1. FMS with associated symptoms including: chronic fatigue, depression, migraine headaches, RLS, TMJ, numerous tender points and trigger points.  2. Hx of major depression  3. Hypothyroid 4. Recent dizziness: ?source, bp vs thyroid vs medication source vs volume issue---symptoms not consistent with vertigo.    Plan:  1. Refilled hydrocodone 10/325 one to 2 q8 prn #180. I gave her a second rx for next month.  2. Sq imitrex for severe, breakthrough migraine treatment. Use maxalt for initial oral treatment of migraine.  3. Stress/anxiety mgt remains key. Celexa 4. Check lab work: cbc, cmet, thyroid studies. If these are normal, we have to consider stress as a potential source.  5. Follow up with me in two months. 15 minutes of face to face patient care time were spent during this visit. All questions were encouraged and answered.

## 2016-01-12 ENCOUNTER — Encounter: Payer: Self-pay | Admitting: Physical Medicine & Rehabilitation

## 2016-01-12 LAB — COMPREHENSIVE METABOLIC PANEL
A/G RATIO: 2.1 (ref 1.2–2.2)
ALBUMIN: 4.8 g/dL (ref 3.6–4.8)
ALK PHOS: 66 IU/L (ref 39–117)
ALT: 20 IU/L (ref 0–32)
AST: 18 IU/L (ref 0–40)
BUN / CREAT RATIO: 14 (ref 12–28)
BUN: 13 mg/dL (ref 8–27)
CO2: 25 mmol/L (ref 18–29)
CREATININE: 0.93 mg/dL (ref 0.57–1.00)
Calcium: 9.9 mg/dL (ref 8.7–10.3)
Chloride: 102 mmol/L (ref 96–106)
GFR calc Af Amer: 76 mL/min/{1.73_m2} (ref >59)
GFR calc non Af Amer: 66 mL/min/{1.73_m2} (ref >59)
GLOBULIN, TOTAL: 2.3 g/dL (ref 1.5–4.5)
Glucose: 94 mg/dL (ref 65–99)
POTASSIUM: 4.2 mmol/L (ref 3.5–5.2)
SODIUM: 141 mmol/L (ref 134–144)
Total Protein: 7.1 g/dL (ref 6.0–8.5)

## 2016-01-12 LAB — CBC
Hematocrit: 39 % (ref 34.0–46.6)
Hemoglobin: 13.2 g/dL (ref 11.1–15.9)
MCH: 28.3 pg (ref 26.6–33.0)
MCHC: 33.8 g/dL (ref 31.5–35.7)
MCV: 84 fL (ref 79–97)
PLATELETS: 354 10*3/uL (ref 150–379)
RBC: 4.66 x10E6/uL (ref 3.77–5.28)
RDW: 15.2 % (ref 12.3–15.4)
WBC: 8 10*3/uL (ref 3.4–10.8)

## 2016-01-12 LAB — T3, FREE: T3 FREE: 2.4 pg/mL (ref 2.0–4.4)

## 2016-01-12 LAB — T4, FREE: FREE T4: 1.32 ng/dL (ref 0.82–1.77)

## 2016-01-12 LAB — TSH: TSH: 2.37 u[IU]/mL (ref 0.450–4.500)

## 2016-01-16 ENCOUNTER — Telehealth: Payer: Self-pay | Admitting: Physical Medicine & Rehabilitation

## 2016-01-16 ENCOUNTER — Encounter: Payer: Self-pay | Admitting: Physical Medicine & Rehabilitation

## 2016-01-16 NOTE — Telephone Encounter (Signed)
I left message for Mrs Guilbert.  [name] has not commented on labs but I didn't see anything outstanding on the reports. I left message that I believe the results are released to MyChart in a few days and she can wait or call back if she has questions

## 2016-01-16 NOTE — Telephone Encounter (Signed)
Patient would like to know the results of her lab work.  Please call patient at [phone].

## 2016-01-16 NOTE — Telephone Encounter (Signed)
Please let Mrs. Soderberg know that all labs are within normal limits. I wouldn't add or change anything.

## 2016-01-31 ENCOUNTER — Other Ambulatory Visit: Payer: Self-pay | Admitting: Family Medicine

## 2016-01-31 DIAGNOSIS — R42 Dizziness and giddiness: Secondary | ICD-10-CM

## 2016-02-03 ENCOUNTER — Other Ambulatory Visit: Payer: Self-pay | Admitting: Family Medicine

## 2016-02-03 DIAGNOSIS — R9089 Other abnormal findings on diagnostic imaging of central nervous system: Secondary | ICD-10-CM

## 2016-02-06 ENCOUNTER — Ambulatory Visit: Admission: RE | Admit: 2016-02-06 | Payer: Medicare Other | Source: Ambulatory Visit

## 2016-02-06 ENCOUNTER — Ambulatory Visit
Admission: RE | Admit: 2016-02-06 | Discharge: 2016-02-06 | Disposition: A | Discharge status: Home / Self Care | Payer: Medicare Other | Source: Ambulatory Visit | Attending: Family Medicine | Admitting: Family Medicine

## 2016-02-06 DIAGNOSIS — R9089 Other abnormal findings on diagnostic imaging of central nervous system: Secondary | ICD-10-CM

## 2016-02-08 ENCOUNTER — Telehealth: Payer: Self-pay | Admitting: *Deleted

## 2016-02-08 NOTE — Telephone Encounter (Signed)
Oncology Nurse Navigator Documentation  Oncology Nurse Navigator Flowsheets 02/08/2016  Navigator Location [facility]  Referral date to RadOnc/MedOnc 02/08/2016  Navigator Encounter Type Telephone;Introductory phone call/I received a call from Dr. Ammie Ferrier office with a referral on Ms. Rada.  According to the office she has multiple brain mets and lung mass. I spoke with Dr. [name] about situation.  He asked that I speak to rad onc.  I spoke to brain navigator and updated her.  She and Dr. [name] felt patient needs to go to ED with neuro symptoms.  I called and spoke with patient.  I learned that her neuro symptoms have gotten worse over the last 2 weeks and she is not moving her right side well.  I informed her to go to ED for evaluation.  She stated she would go to ED.   Telephone Outgoing Call  Treatment Phase Pre-Tx/Tx Discussion  Barriers/Navigation Needs Coordination of Care  Interventions Coordination of Care  Coordination of Care Other  Acuity Level 2  Acuity Level 2 Other  Time Spent with Patient 16

## 2016-02-08 NOTE — Telephone Encounter (Signed)
Oncology Nurse Navigator Documentation  Oncology Nurse Navigator Flowsheets 02/08/2016  Navigator Location [facility]  Navigator Encounter Type Telephone/I called to follow up on patient to see if she went to the hospital.  She did not.  I listened as she explained.  She stated she did not think it was very serious.  She asked why I was concerned when no one else was.  I updated her on the reason for concern.  I stated it may be a week before oncology to see.  She stated she was ok with that.  I will updated rad onc and med onc team of patient's decision.  I will follow up with patient and updated her on that.  She was thankful for the concern.    Telephone Outgoing Call  Treatment Phase Pre-Tx/Tx Discussion  Barriers/Navigation Needs Coordination of Care  Interventions Coordination of Care  Coordination of Care Other  Acuity Level 1  Acuity Level 2 Other  Time Spent with Patient 30

## 2016-02-09 ENCOUNTER — Ambulatory Visit
Admission: RE | Admit: 2016-02-09 | Discharge: 2016-02-09 | Disposition: A | Discharge status: Home / Self Care | Payer: Medicare Other | Source: Ambulatory Visit | Attending: Radiation Oncology | Admitting: Radiation Oncology

## 2016-02-09 ENCOUNTER — Telehealth: Payer: Self-pay | Admitting: *Deleted

## 2016-02-09 VITALS — BP 101/64 | HR 81 | Temp 97.8°F | Resp 16 | Wt 124.1 lb

## 2016-02-09 DIAGNOSIS — C7931 Secondary malignant neoplasm of brain: Secondary | ICD-10-CM | POA: Insufficient documentation

## 2016-02-09 DIAGNOSIS — R918 Other nonspecific abnormal finding of lung field: Secondary | ICD-10-CM

## 2016-02-09 MED ORDER — DEXAMETHASONE 4 MG PO TABS: 4.0000 mg | ORAL_TABLET | Freq: Four times a day (QID) | ORAL | 0 refills | Status: DC

## 2016-02-09 NOTE — Telephone Encounter (Signed)
Oncology Nurse Navigator Documentation  Oncology Nurse Navigator Flowsheets 02/09/2016  Navigator Location [facility]  Navigator Encounter Type Telephone/per Dr. Johny Shears request, I called patient and scheduled her to be seen today at 2 pm.  She verbalized understanding of appt time and place. I asked her to bring CD of scans.   Telephone Outgoing Call  Treatment Phase Pre-Tx/Tx Discussion  Barriers/Navigation Needs Coordination of Care  Interventions Coordination of Care  Coordination of Care Appts  Acuity Level 2  Acuity Level 2 Assistance expediting appointments  Time Spent with Patient 30

## 2016-02-09 NOTE — Progress Notes (Signed)
[place]         [phone] ________________________________  Thoracic Oncology Multidisciplinary Conference  Name: Jenna Molina MRN: [id number]  Date: 02/09/2016  DOB: Dec 03, 1955  VO:[id number] Jenna Hawking, MD  Jenna Lass, MD   REFERRING PHYSICIAN: Kathyrn Lass, MD  DIAGNOSIS: The encounter diagnosis was Probable brain metastases [facility]).    ICD-9-CM ICD-10-CM   1. Probable brain metastases (HCC) 198.3 C79.31     HISTORY OF PRESENT ILLNESS: Jenna Molina is a 60 y.o. female seen at the request of Dr. Milly Molina for a new probable lung cancer with concerns for brain metastases. The patient has multiple medical conditions including migraine headaches, but had been noticing increasing headaches, nausea, and weakness of her right upper extremity over a period of about 2 or more months. She was seen and sent for MRI of the brain and had this performed at [facility] due to the open MRI available there. The MRI on 02/03/16 revealed multiple metastatic lesions approximately 5 in total with surrounding edema. There is a lesion along the left motor strip measuring 16 x 12 mm, and a 14 mm lesion in the right temporal lobe. She had a CT c/a/p also that revealed a left upper lobe lesion which measured 38 x25x17 mm with concerns for ipsilateral adenopathy in a hilar node measuring 27 x 10 mm as well as possible lesion at T2 and L1. There is also questionable change at the umbilicus, possibly consistent with sister mary joseph's node. She comes today for further discussion of work up and to discuss the role for radiotherapy in her cancer care.     PREVIOUS RADIATION THERAPY: No  PAST MEDICAL HISTORY:  Past Medical History:  Diagnosis Date  . Cancer [facility])    Cervical cancer  . Fibromyalgia   . Migraines       PAST SURGICAL HISTORY: Past Surgical History:  Procedure Laterality Date  . ABDOMINAL HYSTERECTOMY    . APPENDECTOMY    . CHOLECYSTECTOMY N/A 04/01/2014   Procedure:  LAPAROSCOPIC CHOLECYSTECTOMY WITH INTRAOPERATIVE CHOLANGIOGRAM;  Surgeon: Jackolyn Confer, MD;  Location: [facility] ORS;  Service: General;  Laterality: N/A;  . ERCP N/A 04/02/2014   Procedure: ENDOSCOPIC RETROGRADE CHOLANGIOPANCREATOGRAPHY (ERCP);  Surgeon: Milus Banister, MD;  Location: [facility] ORS;  Service: Gastroenterology;  Laterality: N/A;  . INNER EAR SURGERY    . MASTOIDECTOMY    . MOLE REMOVAL      FAMILY HISTORY:  Family History  Problem Relation Age of Onset  . Cancer Mother   . Diabetes Mother   . Anxiety disorder Mother   . Emphysema Father   . Anxiety disorder Father   . Alcohol abuse Father   . COPD Father     SOCIAL HISTORY:  Social History   Social History  . Marital status: Married    Spouse name: N/A  . Number of children: N/A  . Years of education: N/A   Occupational History  . Not on file.   Social History Main Topics  . Smoking status: Former Smoker    Quit date: 06/19/2008  . Smokeless tobacco: Never Used  . Alcohol use No  . Drug use: No  . Sexual activity: Not on file   Other Topics Concern  . Not on file   Social History Narrative  . No narrative on file    ALLERGIES: Aspirin; Lyrica [pregabalin]; Sulfa antibiotics; Adhesive [tape]; and Neosporin [neomycin-bacitracin zn-polymyx]  MEDICATIONS:  Current Outpatient Prescriptions  Medication Sig Dispense Refill  . Acetaminophen 500  MG coapsule Take 250-500 mg by mouth 3 (three) times daily. Takes '250mg'$  at 0030 and 0800 and '500mg'$  at bedtime (1430)    . cholecalciferol (VITAMIN D) 1000 UNITS tablet Take 5,000 Units by mouth every morning.    . citalopram (CELEXA) 40 MG tablet Take 40 mg by mouth at bedtime.     . cyclobenzaprine (FLEXERIL) 10 MG tablet TAKE TWO TABLETS BY MOUTH AT BEDTIME 60 tablet 2  . HYDROcodone-acetaminophen (NORCO) 10-325 MG tablet Take 2 tablets by mouth every 8 (eight) hours. 180 tablet 0  . levothyroxine (SYNTHROID, LEVOTHROID) 100 MCG tablet Take 100 mcg by mouth every  morning.    Marland Kitchen LORazepam (ATIVAN) 1 MG tablet Take 1 mg by mouth every 6 (six) hours as needed. For anxiety    . Polyethyl Glycol-Propyl Glycol (SYSTANE OP) Place 2 drops into both eyes daily as needed (dryness).    . promethazine (PHENERGAN) 25 MG tablet Take 25 mg by mouth every 6 (six) hours as needed. With Imitrex For nausea    . SUMAtriptan (IMITREX) 6 MG/0.5ML SOLN injection Inject 0.5 mLs (6 mg total) into the skin every 2 (two) hours as needed for migraine or headache. May repeat in 2 hours if headache persists or recurs. 5 vial 2  . topiramate (TOPAMAX) 200 MG tablet Take 200 mg by mouth every morning.    . vitamin E 400 UNIT capsule Take 400 Units by mouth every morning.     No current facility-administered medications for this encounter.     REVIEW OF SYSTEMS:  On review of systems, the patient reports that she is doing well overall. Patient reports she has had hearing changes since a mastoidectomy in her left ear. She reports occasional, dull headaches focused on the left side. She denies any chest pain, shortness of breath, cough, fevers, chills, night sweats, unintended weight changes. She denies any bowel or bladder disturbances, and denies abdominal pain or vomiting, however her husband reports that she has feelings of urgency of the bladder and bowels. The patient does report frequent nausea. She also reports weakness in her right hand and arm, as well as difficulty with coordination of the right arm. Her husband reports that her memory and speech capabilities have lessened somewhat "in the last month." A complete review of systems is obtained and is otherwise negative.    PHYSICAL EXAM:  Wt Readings from Last 3 Encounters:  02/09/16 124 lb 1.6 oz (56.3 kg)  07/11/15 125 lb (56.7 kg)  06/16/14 123 lb 12.8 oz (56.2 kg)   Temp Readings from Last 3 Encounters:  02/09/16 97.8 F (36.6 C)  07/11/15 98.4 F (36.9 C) (Oral)  04/03/14 99 F (37.2 C) (Oral)   BP Readings from Last  3 Encounters:  02/09/16 101/64  01/11/16 110/71  11/15/15 116/75   Pulse Readings from Last 3 Encounters:  02/09/16 81  01/11/16 82  11/15/15 87    In general this is a well appearing Caucasian female in no acute distress. She is alert and oriented x4 and appropriate throughout the examination. HEENT reveals that the patient is normocephalic, atraumatic. EOMs are intact. PERRLA. Skin is intact without any evidence of gross lesions. Cardiovascular exam reveals a regular rate and rhythm, no clicks rubs or murmurs are auscultated. Chest is clear to auscultation bilaterally. Lymphatic assessment is performed and does not reveal any adenopathy in the cervical, supraclavicular, axillary, or inguinal chains. Abdomen has active bowel sounds in all quadrants and is intact. The abdomen is soft, non  tender, non distended. Lower extremities are negative for pretibial pitting edema, deep calf tenderness, cyanosis or clubbing.     KPS = 90  100 - Normal; no complaints; no evidence of disease. 90   - Able to carry on normal activity; minor signs or symptoms of disease. 80   - Normal activity with effort; some signs or symptoms of disease. 74   - Cares for self; unable to carry on normal activity or to do active work. 60   - Requires occasional assistance, but is able to care for most of his personal needs. 50   - Requires considerable assistance and frequent medical care. 46   - Disabled; requires special care and assistance. 83   - Severely disabled; hospital admission is indicated although death not imminent. 96   - Very sick; hospital admission necessary; active supportive treatment necessary. 10   - Moribund; fatal processes progressing rapidly. 0     - Dead  Karnofsky DA, [place], Craver LS and Burchenal JH [phone]) The use of the nitrogen mustards in the palliative treatment of carcinoma: with particular reference to bronchogenic carcinoma Cancer 1 [id number]  LABORATORY DATA:  Lab Results    Component Value Date   WBC 8.0 01/11/2016   HGB 8.8 (L) 04/03/2014   HCT 39.0 01/11/2016   MCV 84 01/11/2016   PLT 354 01/11/2016   Lab Results  Component Value Date   NA 141 01/11/2016   K 4.2 01/11/2016   CL 102 01/11/2016   CO2 25 01/11/2016   Lab Results  Component Value Date   ALT 20 01/11/2016   AST 18 01/11/2016   ALKPHOS 66 01/11/2016   BILITOT <0.2 01/11/2016     RADIOGRAPHY: Mm Diag Breast Tomo Bilateral  Result Date: 02/06/2016 CLINICAL DATA:  Recent MRI demonstrated rain lesions-possible metastases.Mammogram requested. EXAM: 2D DIGITAL DIAGNOSTIC BILATERAL MAMMOGRAM WITH CAD AND ADJUNCT TOMO COMPARISON:  Previous examinations most recent which is dated 09/07/2014. ACR Breast Density Category b: There are scattered areas of fibroglandular density. FINDINGS: There is no mass, distortion, or worrisome calcification within either breast. The parenchymal pattern is stable. Mammographic images were processed with CAD. IMPRESSION: No findings worrisome for developing malignancy. RECOMMENDATION: Screening mammography in 1 year. I have discussed the findings and recommendations with the patient. Results were also provided in writing at the conclusion of the visit. If applicable, a reminder letter will be sent to the patient regarding the next appointment. BI-RADS CATEGORY  1: Negative. Electronically Signed   By: Altamese Cabal M.D.   On: 02/06/2016 08:36      IMPRESSION/PLAN: 1. Concerns for Stage IV T2N2M1b carcinoma, of the left upper lobe of the lung with possible brain metastases. Dr. Tammi Klippel reviews the imaging studies with the patient and her husband. He discusses the concerns that she has a tumor in the left upper lobe of the lung with surrounding adenopathy as well as several lesions in the brain which appears consistent with a stage IV lung cancer. Dr. Tammi Klippel recommends proceeding with a PET scan and orders were placed for this. She will have this done prior to  proceeding with biopsy. We will anticipate followin up as soon as PET is available, and once pathology is outlined, we would consider 3T MRI and possible SRS treatment versus whole brain radiotherapy. The patient states agreement and understanding and we will see her back after her PET. 2. Right extremity weakness secondary to brain metastases. I prescribed Dexamethasone 4 mg QID to combat swelling and  alleviate some of the weakness in her arm. We will follow with this. The patient will return to [place] with any questions or concerns she has.   The above documentation reflects my direct findings during this shared patient visit. Please see the separate note by Dr. Tammi Klippel on this date for the remainder of the patient's plan of care.   Carola Rhine, PAC    This document serves as a record of services personally performed by [name], MD and [name], PA. It was created on his behalf by Maryla Morrow, a trained medical scribe. The creation of this record is based on the scribe's personal observations and the provider's statements to them. This document has been checked and approved by the attending provider.

## 2016-02-10 ENCOUNTER — Telehealth: Payer: Self-pay | Admitting: *Deleted

## 2016-02-10 ENCOUNTER — Other Ambulatory Visit: Payer: Self-pay | Admitting: Radiation Oncology

## 2016-02-10 ENCOUNTER — Ambulatory Visit
Admission: RE | Admit: 2016-02-10 | Discharge: 2016-02-10 | Disposition: A | Discharge status: Home / Self Care | Payer: Medicare Other | Source: Ambulatory Visit | Attending: Radiation Oncology | Admitting: Radiation Oncology

## 2016-02-10 ENCOUNTER — Encounter: Payer: Self-pay | Admitting: *Deleted

## 2016-02-10 DIAGNOSIS — C801 Malignant (primary) neoplasm, unspecified: Secondary | ICD-10-CM

## 2016-02-10 NOTE — Telephone Encounter (Signed)
Ms. Wandrey called. She is aware of PET scan appt time and place and instruction.  She was thankful for getting her scan so soon.

## 2016-02-10 NOTE — Progress Notes (Signed)
I called central scheduling to schedule appt for PET.  I then called patient.  I left mv message with the appt time and place and pre-procedure instructions. I also left my name and phone number to call that she received my message.

## 2016-02-10 NOTE — Telephone Encounter (Signed)
Oncology Nurse Navigator Documentation  Oncology Nurse Navigator Flowsheets 02/10/2016  Navigator Location [facility]  Navigator Encounter Type Clinic/MDC/I spoke with patient and her husband yesterday.  I updated on next steps.  I called Rad Onc to help with pre cert of PET scan.   Abnormal Finding Date 02/07/2016  Multidisiplinary Clinic Date 02/09/2016  Patient Visit Type MedOnc  Treatment Phase Pre-Tx/Tx Discussion  Barriers/Navigation Needs Coordination of Care;Education  Education Other  Interventions Coordination of Care;Education  Coordination of Care Other  Education Method Verbal  Acuity Level 2  Acuity Level 2 Educational needs;Other  Time Spent with Patient 30

## 2016-02-12 ENCOUNTER — Other Ambulatory Visit: Payer: Medicare Other

## 2016-02-14 ENCOUNTER — Other Ambulatory Visit: Payer: Self-pay | Admitting: Radiation Therapy

## 2016-02-14 ENCOUNTER — Encounter
Admission: RE | Admit: 2016-02-14 | Discharge: 2016-02-14 | Disposition: A | Discharge status: Home / Self Care | Payer: Medicare Other | Source: Ambulatory Visit | Attending: Radiation Oncology | Admitting: Radiation Oncology

## 2016-02-14 ENCOUNTER — Other Ambulatory Visit: Payer: Self-pay | Admitting: Radiation Oncology

## 2016-02-14 DIAGNOSIS — C7931 Secondary malignant neoplasm of brain: Secondary | ICD-10-CM | POA: Insufficient documentation

## 2016-02-14 DIAGNOSIS — R918 Other nonspecific abnormal finding of lung field: Secondary | ICD-10-CM | POA: Diagnosis present

## 2016-02-14 DIAGNOSIS — C7949 Secondary malignant neoplasm of other parts of nervous system: Principal | ICD-10-CM

## 2016-02-14 LAB — GLUCOSE, CAPILLARY: Glucose-Capillary: 95 mg/dL (ref 65–99)

## 2016-02-14 MED ORDER — FLUDEOXYGLUCOSE F - 18 (FDG) INJECTION
13.1400 | Freq: Once | INTRAVENOUS | Status: AC
  Administered 2016-02-14: 13.14 via INTRAVENOUS

## 2016-02-14 NOTE — Progress Notes (Signed)
I called the patient's husband and had to leave a voicemail asking he or the patient to call me to discuss PET results and plans for biopsy.

## 2016-02-15 ENCOUNTER — Telehealth: Payer: Self-pay | Admitting: *Deleted

## 2016-02-15 ENCOUNTER — Other Ambulatory Visit: Payer: Self-pay | Admitting: Radiation Oncology

## 2016-02-15 ENCOUNTER — Telehealth: Payer: Self-pay | Admitting: Radiation Oncology

## 2016-02-15 MED ORDER — LORAZEPAM 1 MG PO TABS: ORAL_TABLET | ORAL | 0 refills | Status: DC

## 2016-02-15 NOTE — Progress Notes (Signed)
I called pt back to confirm her Ativan was called in but left message communicating this as well as asked her to call back so we can discuss PET imaging.

## 2016-02-15 NOTE — Telephone Encounter (Signed)
Oncology Nurse Navigator Documentation  Oncology Nurse Navigator Flowsheets 02/15/2016  Navigator Location [facility]  Navigator Encounter Type Telephone/Ms. Carmer called and was very upset.  I listened as she explained.  She doesn't understand what is going on with all the appt and scans. I clarified.   Telephone Incoming Call  Treatment Phase Pre-Tx/Tx Discussion  Barriers/Navigation Needs Education  Education Other  Interventions Education  Education Method Verbal  Acuity Level 1  Acuity Level 1 Minimal follow up required  Acuity Level 2 Educational needs  Time Spent with Patient 15

## 2016-02-15 NOTE — Telephone Encounter (Signed)
I called the patient again and had to leave another message asking her to call me back about PET results.

## 2016-02-15 NOTE — Telephone Encounter (Signed)
Jenna Molina called requesting [name] call her ,2nd request, stated "I left her a voice message" will in basket PA,  12:49 PM

## 2016-02-16 ENCOUNTER — Encounter: Payer: Self-pay | Admitting: Physical Medicine & Rehabilitation

## 2016-02-16 ENCOUNTER — Other Ambulatory Visit: Payer: Self-pay | Admitting: Radiology

## 2016-02-16 NOTE — Telephone Encounter (Signed)
The patient called me back and we reviewed PET results and rationale for CT biopsy of the lung. We will review path by phone next week when available then coordinate her radiation planning based on the histology.

## 2016-02-17 ENCOUNTER — Ambulatory Visit (HOSPITAL_COMMUNITY)
Admission: RE | Admit: 2016-02-17 | Discharge: 2016-02-17 | Disposition: A | Discharge status: Home / Self Care | Payer: Medicare Other | Source: Ambulatory Visit | Attending: Interventional Radiology | Admitting: Interventional Radiology

## 2016-02-17 ENCOUNTER — Encounter (HOSPITAL_COMMUNITY): Payer: Self-pay

## 2016-02-17 ENCOUNTER — Ambulatory Visit (HOSPITAL_COMMUNITY)
Admission: RE | Admit: 2016-02-17 | Discharge: 2016-02-17 | Disposition: A | Discharge status: Home / Self Care | Payer: Medicare Other | Source: Ambulatory Visit | Attending: Radiation Oncology | Admitting: Radiation Oncology

## 2016-02-17 DIAGNOSIS — E039 Hypothyroidism, unspecified: Secondary | ICD-10-CM | POA: Insufficient documentation

## 2016-02-17 DIAGNOSIS — C3412 Malignant neoplasm of upper lobe, left bronchus or lung: Secondary | ICD-10-CM | POA: Diagnosis not present

## 2016-02-17 DIAGNOSIS — F329 Major depressive disorder, single episode, unspecified: Secondary | ICD-10-CM | POA: Diagnosis not present

## 2016-02-17 DIAGNOSIS — G709 Myoneural disorder, unspecified: Secondary | ICD-10-CM | POA: Insufficient documentation

## 2016-02-17 DIAGNOSIS — G43909 Migraine, unspecified, not intractable, without status migrainosus: Secondary | ICD-10-CM | POA: Diagnosis not present

## 2016-02-17 DIAGNOSIS — Z8541 Personal history of malignant neoplasm of cervix uteri: Secondary | ICD-10-CM | POA: Insufficient documentation

## 2016-02-17 DIAGNOSIS — F419 Anxiety disorder, unspecified: Secondary | ICD-10-CM | POA: Diagnosis not present

## 2016-02-17 DIAGNOSIS — R911 Solitary pulmonary nodule: Secondary | ICD-10-CM

## 2016-02-17 DIAGNOSIS — Z9049 Acquired absence of other specified parts of digestive tract: Secondary | ICD-10-CM | POA: Insufficient documentation

## 2016-02-17 DIAGNOSIS — R011 Cardiac murmur, unspecified: Secondary | ICD-10-CM | POA: Insufficient documentation

## 2016-02-17 DIAGNOSIS — K219 Gastro-esophageal reflux disease without esophagitis: Secondary | ICD-10-CM | POA: Insufficient documentation

## 2016-02-17 DIAGNOSIS — R918 Other nonspecific abnormal finding of lung field: Secondary | ICD-10-CM | POA: Diagnosis present

## 2016-02-17 DIAGNOSIS — Z90710 Acquired absence of both cervix and uterus: Secondary | ICD-10-CM | POA: Diagnosis not present

## 2016-02-17 DIAGNOSIS — Z87891 Personal history of nicotine dependence: Secondary | ICD-10-CM | POA: Insufficient documentation

## 2016-02-17 DIAGNOSIS — M797 Fibromyalgia: Secondary | ICD-10-CM | POA: Insufficient documentation

## 2016-02-17 HISTORY — DX: Hypothyroidism, unspecified: E03.9

## 2016-02-17 HISTORY — DX: Major depressive disorder, single episode, unspecified: F32.9

## 2016-02-17 HISTORY — DX: Gastro-esophageal reflux disease without esophagitis: K21.9

## 2016-02-17 HISTORY — DX: Myoneural disorder, unspecified: G70.9

## 2016-02-17 HISTORY — DX: Anxiety disorder, unspecified: F41.9

## 2016-02-17 HISTORY — DX: Cardiac murmur, unspecified: R01.1

## 2016-02-17 HISTORY — DX: Depression, unspecified: F32.A

## 2016-02-17 LAB — CBC
HEMATOCRIT: 38.9 % (ref 36.0–46.0)
HEMOGLOBIN: 12.4 g/dL (ref 12.0–15.0)
MCH: 27.7 pg (ref 26.0–34.0)
MCHC: 31.9 g/dL (ref 30.0–36.0)
MCV: 87 fL (ref 78.0–100.0)
Platelets: 343 10*3/uL (ref 150–400)
RBC: 4.47 MIL/uL (ref 3.87–5.11)
RDW: 14.2 % (ref 11.5–15.5)
WBC: 9.8 10*3/uL (ref 4.0–10.5)

## 2016-02-17 LAB — PROTIME-INR
INR: 1.06
Prothrombin Time: 13.8 seconds (ref 11.4–15.2)

## 2016-02-17 LAB — APTT: APTT: 28 s (ref 24–36)

## 2016-02-17 MED ORDER — HYDROCODONE-ACETAMINOPHEN 5-325 MG PO TABS
1.0000 | ORAL_TABLET | Freq: Once | ORAL | Status: AC
  Administered 2016-02-17: 2 via ORAL

## 2016-02-17 MED ORDER — SODIUM CHLORIDE 0.9 % IV SOLN
INTRAVENOUS | Status: AC | PRN
  Administered 2016-02-17: 10 mL/h via INTRAVENOUS

## 2016-02-17 MED ORDER — LIDOCAINE HCL 1 % IJ SOLN
INTRAMUSCULAR | Status: AC
  Filled 2016-02-17: qty 20

## 2016-02-17 MED ORDER — MIDAZOLAM HCL 2 MG/2ML IJ SOLN
INTRAMUSCULAR | Status: AC | PRN
  Administered 2016-02-17 (×2): 1 mg via INTRAVENOUS

## 2016-02-17 MED ORDER — FENTANYL CITRATE (PF) 100 MCG/2ML IJ SOLN
INTRAMUSCULAR | Status: AC | PRN
  Administered 2016-02-17 (×4): 25 ug via INTRAVENOUS

## 2016-02-17 MED ORDER — HYDROCODONE-ACETAMINOPHEN 5-325 MG PO TABS
ORAL_TABLET | ORAL | Status: AC
  Filled 2016-02-17: qty 2

## 2016-02-17 MED ORDER — FENTANYL CITRATE (PF) 100 MCG/2ML IJ SOLN
INTRAMUSCULAR | Status: AC
  Filled 2016-02-17: qty 2

## 2016-02-17 MED ORDER — MIDAZOLAM HCL 2 MG/2ML IJ SOLN
INTRAMUSCULAR | Status: AC
  Filled 2016-02-17: qty 2

## 2016-02-17 MED ORDER — SODIUM CHLORIDE 0.9 % IV SOLN: INTRAVENOUS | Status: DC

## 2016-02-17 NOTE — Procedures (Signed)
LUL nodule PET +  S/p LUL NODULE Ct BX   NO IMMED COMP STABLE Full report in PACS PATH pending

## 2016-02-17 NOTE — Progress Notes (Signed)
Called Pam Turpin,PA about cxr post biopsy and per Pam ok to D/C home

## 2016-02-17 NOTE — Discharge Instructions (Signed)

## 2016-02-17 NOTE — H&P (Signed)
Chief Complaint: Patient was seen in consultation today for Left lung mass at the request of [facility] [place]/Dr [name]  Referring Physician(s): [facility]/Dr [name]  Supervising Physician: Daryll Brod  Patient Status: [facility] - Out-pt  History of Present Illness: Jenna Molina is a 60 y.o. female   Headaches and Migraine hx Worsening headaches took pt to MD Work up revealed Brain metastasis CT revealed Left lung mass +PET 10/31: IMPRESSION: Hypermetabolic 3.3 cm spiculated left upper lobe mass, consistent with primary bronchogenic carcinoma. Hypermetabolic left hilar lymphadenopathy, consistent with metastatic disease. No hypermetabolic mediastinal lymph nodes. Hypermetabolic bone metastases in the L1 vertebral body in right posterior ilium. No evidence of soft tissue metastatic disease within the neck, abdomen, or pelvis. Incidentally noted left cerebellar brain metastasis, as demonstrated on recent outside brain MRI  Scheduled for left lung mass bx per Dr Tammi Klippel   Past Medical History:  Diagnosis Date  . Anxiety   . Cancer [facility])    Cervical cancer  . Depression   . Fibromyalgia   . GERD (gastroesophageal reflux disease)   . Heart murmur   . Hypothyroidism   . Migraines   . Neuromuscular disorder [facility])     Past Surgical History:  Procedure Laterality Date  . ABDOMINAL HYSTERECTOMY    . APPENDECTOMY    . CHOLECYSTECTOMY N/A 04/01/2014   Procedure: LAPAROSCOPIC CHOLECYSTECTOMY WITH INTRAOPERATIVE CHOLANGIOGRAM;  Surgeon: Jackolyn Confer, MD;  Location: [facility] ORS;  Service: General;  Laterality: N/A;  . ERCP N/A 04/02/2014   Procedure: ENDOSCOPIC RETROGRADE CHOLANGIOPANCREATOGRAPHY (ERCP);  Surgeon: Milus Banister, MD;  Location: [facility] ORS;  Service: Gastroenterology;  Laterality: N/A;  . INNER EAR SURGERY    . MASTOIDECTOMY    . MOLE REMOVAL      Allergies: Aspirin; Lyrica [pregabalin]; Sulfa antibiotics; Adhesive [tape];  and Neosporin [neomycin-bacitracin zn-polymyx]  Medications: Prior to Admission medications   Medication Sig Start Date End Date Taking? Authorizing Provider  Acetaminophen 500 MG coapsule Take 250-500 mg by mouth 3 (three) times daily. Takes '250mg'$  at 0030 and 0800 and '500mg'$  at bedtime (1430)   Yes Historical Provider, MD  cholecalciferol (VITAMIN D) 1000 UNITS tablet Take 5,000 Units by mouth every morning.   Yes Historical Provider, MD  citalopram (CELEXA) 40 MG tablet Take 60 mg by mouth at bedtime.  06/21/14  Yes Historical Provider, MD  conjugated estrogens (PREMARIN) vaginal cream Place 1 Applicatorful vaginally 2 (two) times a week.   Yes Historical Provider, MD  cyclobenzaprine (FLEXERIL) 10 MG tablet TAKE TWO TABLETS BY MOUTH AT BEDTIME 12/09/15  Yes Meredith Staggers, MD  dexamethasone (DECADRON) 4 MG tablet Take 1 tablet (4 mg total) by mouth 4 (four) times daily. 02/09/16  Yes Hayden Pedro, PA-C  HYDROcodone-acetaminophen [facility]) 10-325 MG tablet Take 2 tablets by mouth every 8 (eight) hours. 01/11/16  Yes Meredith Staggers, MD  levothyroxine (SYNTHROID, LEVOTHROID) 100 MCG tablet Take 100 mcg by mouth every morning.   Yes Historical Provider, MD  LORazepam (ATIVAN) 1 MG tablet Take 1 mg by mouth every 6 (six) hours.   Yes Historical Provider, MD  Polyethyl Glycol-Propyl Glycol (SYSTANE OP) Place 2 drops into both eyes daily as needed (dryness).   Yes Historical Provider, MD  promethazine (PHENERGAN) 25 MG tablet Take 25 mg by mouth every 6 (six) hours as needed. With Imitrex For nausea   Yes Historical Provider, MD  SUMAtriptan (IMITREX) 100 MG tablet Take 100 mg by mouth every 2 (two) hours as needed  for migraine. May repeat in 2 hours if headache persists or recurs.   Yes Historical Provider, MD  SUMAtriptan (IMITREX) 6 MG/0.5ML SOLN injection Inject 0.5 mLs (6 mg total) into the skin every 2 (two) hours as needed for migraine or headache. May repeat in 2 hours if headache persists  or recurs. 07/20/15  Yes Meredith Staggers, MD  topiramate (TOPAMAX) 200 MG tablet Take 200 mg by mouth every morning.   Yes Historical Provider, MD  vitamin E 400 UNIT capsule Take 400 Units by mouth every morning.   Yes Historical Provider, MD  LORazepam (ATIVAN) 1 MG tablet 1 tab po 30 minutes prior to radiation or MRI. Patient not taking: Reported on 02/17/2016 02/15/16   Hayden Pedro, PA-C     Family History  Problem Relation Age of Onset  . Cancer Mother   . Diabetes Mother   . Anxiety disorder Mother   . Emphysema Father   . Anxiety disorder Father   . Alcohol abuse Father   . COPD Father     Social History   Social History  . Marital status: Married    Spouse name: N/A  . Number of children: N/A  . Years of education: N/A   Social History Main Topics  . Smoking status: Former Smoker    Quit date: 06/19/2008  . Smokeless tobacco: Never Used  . Alcohol use No  . Drug use: No  . Sexual activity: Yes   Other Topics Concern  . None   Social History Narrative  . None    Review of Systems: A 12 point ROS discussed and pertinent positives are indicated in the HPI above.  All other systems are negative.  Review of Systems  Constitutional: Positive for fatigue. Negative for activity change, appetite change and fever.  Respiratory: Negative for shortness of breath.   Cardiovascular: Negative for chest pain.  Gastrointestinal: Negative for abdominal pain.  Neurological: Positive for weakness and headaches.  Psychiatric/Behavioral: Negative for behavioral problems and confusion.    Vital Signs: BP 102/73   Pulse 71   Resp 16   SpO2 96%   Physical Exam  Constitutional: She is oriented to person, place, and time. She appears well-developed and well-nourished.  HENT:  Head: Normocephalic and atraumatic.  Neck: Normal range of motion. Neck supple.  Cardiovascular: Normal rate, regular rhythm and normal heart sounds.   No murmur heard. Pulmonary/Chest: Effort  normal and breath sounds normal. No respiratory distress. She has no wheezes. She has no rales.  Musculoskeletal: Normal range of motion.  Neurological: She is alert and oriented to person, place, and time.  Skin: Skin is warm and dry.  Psychiatric: She has a normal mood and affect. Her behavior is normal. Judgment and thought content normal.  Nursing note and vitals reviewed.   Mallampati Score:  MD Evaluation Airway: WNL Heart: WNL Abdomen: WNL Chest/ Lungs: WNL ASA  Classification: 3 Mallampati/Airway Score: One  Imaging: Nm Pet Image Initial (pi) Skull Base To Thigh  Result Date: 02/14/2016 CLINICAL DATA:  Initial treatment strategy for left lung carcinoma with brain metastases. Personal history of cervical carcinoma. EXAM: NUCLEAR MEDICINE PET SKULL BASE TO THIGH TECHNIQUE: 13.1 mCi F-18 FDG was injected intravenously. Full-ring PET imaging was performed from the skull base to thigh after the radiotracer. CT data was obtained and used for attenuation correction and anatomic localization. FASTING BLOOD GLUCOSE:  Value: 95 mg/dl COMPARISON:  CT from [facility] dated 02/07/2016 FINDINGS: NECK No hypermetabolic lymph nodes in  the neck. Incidental note is made of brain metastasis in the left cerebellum, as shown on previous outside brain MRI on 02/03/2016. CHEST Spiculated mass in the left upper lobe measures 3.3 x 1.6 cm. This shows FDG uptake, with SUV max of 8.8. This is consistent with primary bronchogenic carcinoma. No evidence of pleural effusion. Asymmetric metabolic activity is seen in the left suprahilar region with SUV max of 3.8. This corresponds with mild left hilar lymphadenopathy, which is better visualized on recent contrast-enhanced chest CT. No hypermetabolic mediastinal lymph nodes identified. ABDOMEN/PELVIS No abnormal hypermetabolic activity within the liver, pancreas, adrenal glands, or spleen. No hypermetabolic lymph nodes in the abdomen or pelvis. Surgical clips seen  from prior cholecystectomy. Patient has undergone previous hysterectomy and surgical clips are also seen from Previous pelvic lymph node dissection. SKELETON Hypermetabolic sclerotic bone metastasis is seen in the L1 vertebral body, with SUV max of 7.6. Hypermetabolic sclerotic bone metastasis also seen in the right posterior ilium, with SUV max of 6.3. A benign bone island is incidentally noted in the left posterior ilium which shows no FDG uptake. IMPRESSION: Hypermetabolic 3.3 cm spiculated left upper lobe mass, consistent with primary bronchogenic carcinoma. Hypermetabolic left hilar lymphadenopathy, consistent with metastatic disease. No hypermetabolic mediastinal lymph nodes. Hypermetabolic bone metastases in the L1 vertebral body in right posterior ilium. No evidence of soft tissue metastatic disease within the neck, abdomen, or pelvis. Incidentally noted left cerebellar brain metastasis, as demonstrated on recent outside brain MRI. Electronically Signed   By: Earle Gell M.D.   On: 02/14/2016 13:45   Mm Diag Breast Tomo Bilateral  Result Date: 02/06/2016 CLINICAL DATA:  Recent MRI demonstrated rain lesions-possible metastases.Mammogram requested. EXAM: 2D DIGITAL DIAGNOSTIC BILATERAL MAMMOGRAM WITH CAD AND ADJUNCT TOMO COMPARISON:  Previous examinations most recent which is dated 09/07/2014. ACR Breast Density Category b: There are scattered areas of fibroglandular density. FINDINGS: There is no mass, distortion, or worrisome calcification within either breast. The parenchymal pattern is stable. Mammographic images were processed with CAD. IMPRESSION: No findings worrisome for developing malignancy. RECOMMENDATION: Screening mammography in 1 year. I have discussed the findings and recommendations with the patient. Results were also provided in writing at the conclusion of the visit. If applicable, a reminder letter will be sent to the patient regarding the next appointment. BI-RADS CATEGORY  1:  Negative. Electronically Signed   By: Altamese Cabal M.D.   On: 02/06/2016 08:36   Mr Outside Films Head/face  Result Date: 02/10/2016 CLINICAL DATA:  This exam is stored here for comparison purposes only and was performed at an outside facility.   Please contact the originating institution for any associated interpretation or report.   Ct Outside Films Body  Result Date: 02/10/2016 CLINICAL DATA:  This exam is stored here for comparison purposes only and was performed at an outside facility.   Please contact the originating institution for any associated interpretation or report.    Labs:  CBC:  Recent Labs  01/11/16 1037 02/17/16 0800  WBC 8.0 9.8  HGB  --  12.4  HCT 39.0 38.9  PLT 354 343    COAGS:  Recent Labs  02/17/16 0800  INR 1.06  APTT 28    BMP:  Recent Labs  01/11/16 1037  NA 141  K 4.2  CL 102  CO2 25  GLUCOSE 94  BUN 13  CALCIUM 9.9  CREATININE 0.93  GFRNONAA 66  GFRAA 76    LIVER FUNCTION TESTS:  Recent Labs  01/11/16 1037  BILITOT <0.2  AST 18  ALT 20  ALKPHOS 66  PROT 7.1  ALBUMIN 4.8    TUMOR MARKERS: No results for input(s): AFPTM, CEA, CA199, CHROMGRNA in the last 8760 hours.  Assessment and Plan:  Worsening headaches Brain mets Work up reveals LUL mass + PET Now for biopsy of same Risks and Benefits discussed with the patient including, but not limited to bleeding, hemoptysis, respiratory failure requiring intubation, infection, pneumothorax requiring chest tube placement, stroke from air embolism or even death. All of the patient's questions were answered, patient is agreeable to proceed. Consent signed and in chart.   Thank you for this interesting consult.  I greatly enjoyed meeting Jenna Molina and look forward to participating in their care.  A copy of this report was sent to the requesting provider on this date.  Electronically Signed: Lodema Parma A 02/17/2016, 8:42 AM   I spent a total of  30  Minutes   in face to face in clinical consultation, greater than 50% of which was counseling/coordinating care for left lung mass

## 2016-02-20 ENCOUNTER — Encounter: Payer: Self-pay | Admitting: Physical Medicine & Rehabilitation

## 2016-02-20 ENCOUNTER — Encounter: Payer: Self-pay | Admitting: *Deleted

## 2016-02-20 ENCOUNTER — Telehealth: Payer: Self-pay | Admitting: *Deleted

## 2016-02-20 ENCOUNTER — Other Ambulatory Visit: Payer: Self-pay | Admitting: Physical Medicine & Rehabilitation

## 2016-02-20 DIAGNOSIS — C7931 Secondary malignant neoplasm of brain: Secondary | ICD-10-CM

## 2016-02-20 NOTE — Telephone Encounter (Signed)
Oncology Nurse Navigator Documentation  Oncology Nurse Navigator Flowsheets 02/20/2016  Navigator Location [facility]  Navigator Encounter Type Telephone/I followed up with Dr. [name] regarding appt for Jenna Molina.  He stated he could see her next week.  I called patient and scheduled her to be seen on 03/01/16 arrive at 2:45.  She verbalized understanding of appt time and place.   Telephone Outgoing Call  Treatment Phase Pre-Tx/Tx Discussion  Barriers/Navigation Needs Coordination of Care  Coordination of Care Appts  Acuity Level 2  Acuity Level 2 Assistance expediting appointments  Time Spent with Patient 30

## 2016-02-22 ENCOUNTER — Telehealth: Payer: Self-pay | Admitting: Radiation Oncology

## 2016-02-22 MED ORDER — CYCLOBENZAPRINE HCL 10 MG PO TABS: 10.0000 mg | ORAL_TABLET | Freq: Three times a day (TID) | ORAL | 4 refills | Status: AC | PRN

## 2016-02-22 NOTE — Telephone Encounter (Signed)
I spoke with the patient by phone to review the findings from her biopsy results, and to let her know our plans to move forward with 3T MRI to confirm candidacy for [facility] treatment. We will continue to move forward and she understands the logistics of simulation and scheduling appointment with neurosurgery and with Dr. [name] for discussion next week of systemic therapy.

## 2016-02-22 NOTE — Telephone Encounter (Signed)
I have sent in new flexeril rx

## 2016-02-22 NOTE — Telephone Encounter (Signed)
Phoned patient explaining that once [name], PA-C is done with patient care she plans to return her call. Patient verbalized understanding.

## 2016-02-24 ENCOUNTER — Telehealth: Payer: Self-pay | Admitting: Radiation Oncology

## 2016-02-24 ENCOUNTER — Other Ambulatory Visit: Payer: Self-pay | Admitting: Radiation Therapy

## 2016-02-24 ENCOUNTER — Ambulatory Visit
Admission: RE | Admit: 2016-02-24 | Discharge: 2016-02-24 | Disposition: A | Discharge status: Home / Self Care | Payer: Medicare Other | Source: Ambulatory Visit | Attending: Radiation Oncology | Admitting: Radiation Oncology

## 2016-02-24 DIAGNOSIS — C7949 Secondary malignant neoplasm of other parts of nervous system: Principal | ICD-10-CM

## 2016-02-24 DIAGNOSIS — C7931 Secondary malignant neoplasm of brain: Secondary | ICD-10-CM

## 2016-02-24 NOTE — Telephone Encounter (Signed)
I spoke with the patient to discuss her concerns that she called about early this morning. She discusses that she has noticed tremor of her upper extremities. She has had this on two occassions but denies loss of consciousness or loss of control of the bowel or bladder. She reports she is taking dexamethasone but admits that she is not taking this 4x a day like prescribed due to nausea. She is continuing her nexium as prophylaxis, and has been taking phenergan as well.  I encouraged her to resume her dosing of dexamethasone, to go for her MRI today, and to use the Ativan q 4-6 hours prn for refractory nausea. She also understands that the role of the dexamethasone is to also help with her nausea due to her brain disease which I feel is more likely, rather than due to the steroids.

## 2016-02-27 ENCOUNTER — Encounter (HOSPITAL_COMMUNITY): Payer: Self-pay | Admitting: *Deleted

## 2016-02-27 NOTE — Progress Notes (Signed)
Spoke with pt for pre-op call. Pt denies cardiac history, chest pain or sob.

## 2016-02-28 ENCOUNTER — Encounter (HOSPITAL_COMMUNITY): Payer: Self-pay | Admitting: General Practice

## 2016-02-28 ENCOUNTER — Ambulatory Visit (HOSPITAL_COMMUNITY)
Admission: RE | Admit: 2016-02-28 | Discharge: 2016-02-28 | Disposition: A | Discharge status: Home / Self Care | Payer: Medicare Other | Source: Ambulatory Visit | Attending: Radiation Oncology | Admitting: Radiation Oncology

## 2016-02-28 ENCOUNTER — Ambulatory Visit (HOSPITAL_COMMUNITY): Payer: Medicare Other | Admitting: Anesthesiology

## 2016-02-28 ENCOUNTER — Encounter (HOSPITAL_COMMUNITY)
Admission: RE | Disposition: A | Discharge status: Home / Self Care | Payer: Self-pay | Source: Ambulatory Visit | Attending: Radiation Oncology

## 2016-02-28 DIAGNOSIS — Z881 Allergy status to other antibiotic agents status: Secondary | ICD-10-CM | POA: Diagnosis not present

## 2016-02-28 DIAGNOSIS — Z882 Allergy status to sulfonamides status: Secondary | ICD-10-CM | POA: Insufficient documentation

## 2016-02-28 DIAGNOSIS — Z79899 Other long term (current) drug therapy: Secondary | ICD-10-CM | POA: Insufficient documentation

## 2016-02-28 DIAGNOSIS — Z818 Family history of other mental and behavioral disorders: Secondary | ICD-10-CM | POA: Diagnosis not present

## 2016-02-28 DIAGNOSIS — R918 Other nonspecific abnormal finding of lung field: Secondary | ICD-10-CM | POA: Insufficient documentation

## 2016-02-28 DIAGNOSIS — G709 Myoneural disorder, unspecified: Secondary | ICD-10-CM | POA: Insufficient documentation

## 2016-02-28 DIAGNOSIS — R011 Cardiac murmur, unspecified: Secondary | ICD-10-CM | POA: Diagnosis not present

## 2016-02-28 DIAGNOSIS — M797 Fibromyalgia: Secondary | ICD-10-CM | POA: Diagnosis not present

## 2016-02-28 DIAGNOSIS — K219 Gastro-esophageal reflux disease without esophagitis: Secondary | ICD-10-CM | POA: Insufficient documentation

## 2016-02-28 DIAGNOSIS — E039 Hypothyroidism, unspecified: Secondary | ICD-10-CM | POA: Insufficient documentation

## 2016-02-28 DIAGNOSIS — Z9049 Acquired absence of other specified parts of digestive tract: Secondary | ICD-10-CM | POA: Diagnosis not present

## 2016-02-28 DIAGNOSIS — Z90710 Acquired absence of both cervix and uterus: Secondary | ICD-10-CM | POA: Diagnosis not present

## 2016-02-28 DIAGNOSIS — Z811 Family history of alcohol abuse and dependence: Secondary | ICD-10-CM | POA: Insufficient documentation

## 2016-02-28 DIAGNOSIS — Z8052 Family history of malignant neoplasm of bladder: Secondary | ICD-10-CM | POA: Insufficient documentation

## 2016-02-28 DIAGNOSIS — G43909 Migraine, unspecified, not intractable, without status migrainosus: Secondary | ICD-10-CM | POA: Diagnosis not present

## 2016-02-28 DIAGNOSIS — Z825 Family history of asthma and other chronic lower respiratory diseases: Secondary | ICD-10-CM | POA: Diagnosis not present

## 2016-02-28 DIAGNOSIS — C7931 Secondary malignant neoplasm of brain: Secondary | ICD-10-CM

## 2016-02-28 DIAGNOSIS — Z886 Allergy status to analgesic agent status: Secondary | ICD-10-CM | POA: Diagnosis not present

## 2016-02-28 DIAGNOSIS — Z833 Family history of diabetes mellitus: Secondary | ICD-10-CM | POA: Diagnosis not present

## 2016-02-28 DIAGNOSIS — Z8541 Personal history of malignant neoplasm of cervix uteri: Secondary | ICD-10-CM | POA: Insufficient documentation

## 2016-02-28 DIAGNOSIS — Z888 Allergy status to other drugs, medicaments and biological substances status: Secondary | ICD-10-CM | POA: Insufficient documentation

## 2016-02-28 DIAGNOSIS — F419 Anxiety disorder, unspecified: Secondary | ICD-10-CM | POA: Insufficient documentation

## 2016-02-28 DIAGNOSIS — F329 Major depressive disorder, single episode, unspecified: Secondary | ICD-10-CM | POA: Diagnosis not present

## 2016-02-28 DIAGNOSIS — C7949 Secondary malignant neoplasm of other parts of nervous system: Principal | ICD-10-CM

## 2016-02-28 HISTORY — PX: RADIOLOGY WITH ANESTHESIA: SHX6223

## 2016-02-28 HISTORY — DX: Irritable bowel syndrome, unspecified: K58.9

## 2016-02-28 HISTORY — DX: Pneumonia, unspecified organism: J18.9

## 2016-02-28 HISTORY — DX: Unspecified chronic bronchitis: J42

## 2016-02-28 SURGERY — RADIOLOGY WITH ANESTHESIA
Anesthesia: General

## 2016-02-28 MED ORDER — ONDANSETRON HCL 4 MG/2ML IJ SOLN: 4.0000 mg | Freq: Once | INTRAMUSCULAR | Status: DC | PRN

## 2016-02-28 MED ORDER — OXYCODONE HCL 5 MG PO TABS: 5.0000 mg | ORAL_TABLET | Freq: Once | ORAL | Status: DC | PRN

## 2016-02-28 MED ORDER — FENTANYL CITRATE (PF) 100 MCG/2ML IJ SOLN: 25.0000 ug | INTRAMUSCULAR | Status: DC | PRN

## 2016-02-28 MED ORDER — OXYCODONE HCL 5 MG/5ML PO SOLN: 5.0000 mg | Freq: Once | ORAL | Status: DC | PRN

## 2016-02-28 MED ORDER — GADOBENATE DIMEGLUMINE 529 MG/ML IV SOLN
13.0000 mL | Freq: Once | INTRAVENOUS | Status: AC | PRN
  Administered 2016-02-28: 13 mL via INTRAVENOUS

## 2016-02-28 MED ORDER — LACTATED RINGERS IV SOLN
INTRAVENOUS | Status: DC
  Administered 2016-02-28: 09:00:00 via INTRAVENOUS

## 2016-02-28 NOTE — Transfer of Care (Signed)
Immediate Anesthesia Transfer of Care Note  Patient: Jenna Molina  Procedure(s) Performed: Procedure(s): MRI OF THE BRAIN WITH AND WITHOUT (N/A)  Patient Location: PACU  Anesthesia Type:General  Level of Consciousness: awake, alert  and oriented  Airway & Oxygen Therapy: Patient Spontanous Breathing and Patient connected to nasal cannula oxygen  Post-op Assessment: Report given to RN, Post -op Vital signs reviewed and stable and Patient moving all extremities  Post vital signs: Reviewed and stable  Last Vitals:  Vitals:   02/28/16 1215 02/28/16 1219  BP: (!) 106/94 105/65  Pulse: 73 70  Resp: 14 18  Temp:  36.7 C    Last Pain:  Vitals:   02/28/16 1219  TempSrc:   PainSc: 0-No pain      Patients Stated Pain Goal: 3 (12/82/08 1388)  Complications: No apparent anesthesia complications

## 2016-02-28 NOTE — Anesthesia Postprocedure Evaluation (Signed)
Anesthesia Post Note  Patient: Brandolyn Bessent  Procedure(s) Performed: Procedure(s) (LRB): MRI OF THE BRAIN WITH AND WITHOUT (N/A)  Patient location during evaluation: PACU Anesthesia Type: General Level of consciousness: awake, awake and alert, oriented and sedated Pain management: pain level controlled Vital Signs Assessment: post-procedure vital signs reviewed and stable Respiratory status: spontaneous breathing, nonlabored ventilation and respiratory function stable Cardiovascular status: blood pressure returned to baseline Anesthetic complications: no    Last Vitals:  Vitals:   02/28/16 1215 02/28/16 1219  BP: (!) 106/94 105/65  Pulse: 73 70  Resp: 14 18  Temp:  36.7 C    Last Pain:  Vitals:   02/28/16 1219  TempSrc:   PainSc: 0-No pain                 Malyiah Fellows COKER

## 2016-02-28 NOTE — Anesthesia Postprocedure Evaluation (Signed)
Anesthesia Post Note  Patient: Jenna Molina  Procedure(s) Performed: Procedure(s) (LRB): MRI OF THE BRAIN WITH AND WITHOUT (N/A)  Patient location during evaluation: PACU Anesthesia Type: General Level of consciousness: awake, awake and alert and oriented Pain management: pain level controlled Vital Signs Assessment: post-procedure vital signs reviewed and stable Respiratory status: spontaneous breathing, nonlabored ventilation, respiratory function stable and patient connected to nasal cannula oxygen Cardiovascular status: blood pressure returned to baseline Anesthetic complications: no    Last Vitals:  Vitals:   02/28/16 1215 02/28/16 1219  BP: (!) 106/94 105/65  Pulse: 73 70  Resp: 14 18  Temp:  36.7 C    Last Pain:  Vitals:   02/28/16 1219  TempSrc:   PainSc: 0-No pain                 Martha Soltys COKER

## 2016-02-28 NOTE — Anesthesia Preprocedure Evaluation (Addendum)
Anesthesia Evaluation  Patient identified by MRN, date of birth, ID band Patient awake    Reviewed: Allergy & Precautions, NPO status , Patient's Chart, lab work & pertinent test results  Airway Mallampati: II  TM Distance: >3 FB Neck ROM: Full    Dental  (+) Edentulous Upper, Edentulous Lower   Pulmonary former smoker,    breath sounds clear to auscultation       Cardiovascular  Rhythm:Regular Rate:Normal     Neuro/Psych    GI/Hepatic   Endo/Other    Renal/GU      Musculoskeletal   Abdominal   Peds  Hematology   Anesthesia Other Findings   Reproductive/Obstetrics                            Anesthesia Physical Anesthesia Plan  ASA: III  Anesthesia Plan: General   Post-op Pain Management:    Induction: Intravenous  Airway Management Planned: LMA  Additional Equipment:   Intra-op Plan:   Post-operative Plan:   Informed Consent: I have reviewed the patients History and Physical, chart, labs and discussed the procedure including the risks, benefits and alternatives for the proposed anesthesia with the patient or authorized representative who has indicated his/her understanding and acceptance.   Dental advisory given  Plan Discussed with: CRNA and Anesthesiologist  Anesthesia Plan Comments:        Anesthesia Quick Evaluation

## 2016-02-29 ENCOUNTER — Telehealth: Payer: Self-pay | Admitting: Radiation Oncology

## 2016-02-29 ENCOUNTER — Encounter (HOSPITAL_COMMUNITY): Payer: Self-pay | Admitting: Radiology

## 2016-02-29 NOTE — Telephone Encounter (Signed)
I called the patient to let her know her MRI results, but she did not answer and I had to leave a message asking her to call me back.

## 2016-03-01 ENCOUNTER — Other Ambulatory Visit (HOSPITAL_BASED_OUTPATIENT_CLINIC_OR_DEPARTMENT_OTHER): Payer: Medicare Other

## 2016-03-01 ENCOUNTER — Telehealth: Payer: Self-pay | Admitting: Internal Medicine

## 2016-03-01 ENCOUNTER — Ambulatory Visit (HOSPITAL_BASED_OUTPATIENT_CLINIC_OR_DEPARTMENT_OTHER): Payer: Medicare Other | Admitting: Internal Medicine

## 2016-03-01 ENCOUNTER — Encounter: Payer: Self-pay | Admitting: Internal Medicine

## 2016-03-01 ENCOUNTER — Telehealth: Payer: Self-pay | Admitting: Radiation Oncology

## 2016-03-01 DIAGNOSIS — C7931 Secondary malignant neoplasm of brain: Secondary | ICD-10-CM

## 2016-03-01 DIAGNOSIS — C3412 Malignant neoplasm of upper lobe, left bronchus or lung: Secondary | ICD-10-CM | POA: Diagnosis not present

## 2016-03-01 DIAGNOSIS — C7951 Secondary malignant neoplasm of bone: Secondary | ICD-10-CM | POA: Diagnosis not present

## 2016-03-01 DIAGNOSIS — C3411 Malignant neoplasm of upper lobe, right bronchus or lung: Secondary | ICD-10-CM | POA: Diagnosis not present

## 2016-03-01 DIAGNOSIS — C3492 Malignant neoplasm of unspecified part of left bronchus or lung: Secondary | ICD-10-CM

## 2016-03-01 HISTORY — DX: Malignant neoplasm of unspecified part of left bronchus or lung: C34.92

## 2016-03-01 LAB — COMPREHENSIVE METABOLIC PANEL
ALT: 24 U/L (ref 0–55)
AST: 19 U/L (ref 5–34)
Albumin: 4 g/dL (ref 3.5–5.0)
Alkaline Phosphatase: 91 U/L (ref 40–150)
Anion Gap: 9 mEq/L (ref 3–11)
BUN: 15.5 mg/dL (ref 7.0–26.0)
CALCIUM: 9.4 mg/dL (ref 8.4–10.4)
CHLORIDE: 108 meq/L (ref 98–109)
CO2: 23 mEq/L (ref 22–29)
Creatinine: 1 mg/dL (ref 0.6–1.1)
EGFR: 63 mL/min/{1.73_m2} — AB (ref >90)
GLUCOSE: 101 mg/dL (ref 70–140)
POTASSIUM: 3.9 meq/L (ref 3.5–5.1)
SODIUM: 139 meq/L (ref 136–145)
Total Bilirubin: 0.33 mg/dL (ref 0.20–1.20)
Total Protein: 6.7 g/dL (ref 6.4–8.3)

## 2016-03-01 LAB — CBC WITH DIFFERENTIAL/PLATELET
BASO%: 0.3 % (ref 0.0–2.0)
BASOS ABS: 0 10*3/uL (ref 0.0–0.1)
EOS%: 0.4 % (ref 0.0–7.0)
Eosinophils Absolute: 0 10*3/uL (ref 0.0–0.5)
HCT: 37.4 % (ref 34.8–46.6)
HGB: 12.4 g/dL (ref 11.6–15.9)
LYMPH%: 15.3 % (ref 14.0–49.7)
MCH: 28.5 pg (ref 25.1–34.0)
MCHC: 33.2 g/dL (ref 31.5–36.0)
MCV: 86 fL (ref 79.5–101.0)
MONO#: 0.3 10*3/uL (ref 0.1–0.9)
MONO%: 4.3 % (ref 0.0–14.0)
NEUT#: 5.9 10*3/uL (ref 1.5–6.5)
NEUT%: 79.7 % — AB (ref 38.4–76.8)
Platelets: 290 10*3/uL (ref 145–400)
RBC: 4.35 10*6/uL (ref 3.70–5.45)
RDW: 14.2 % (ref 11.2–14.5)
WBC: 7.4 10*3/uL (ref 3.9–10.3)
lymph#: 1.1 10*3/uL (ref 0.9–3.3)

## 2016-03-01 MED FILL — Lactated Ringer's Solution: INTRAVENOUS | Qty: 1000 | Status: AC

## 2016-03-01 MED FILL — Fentanyl Citrate Preservative Free (PF) Inj 100 MCG/2ML: INTRAMUSCULAR | Qty: 2 | Status: AC

## 2016-03-01 MED FILL — Propofol IV Emul 200 MG/20ML (10 MG/ML): INTRAVENOUS | Qty: 20 | Status: AC

## 2016-03-01 MED FILL — Lidocaine HCl IV Inj 20 MG/ML: INTRAVENOUS | Qty: 5 | Status: AC

## 2016-03-01 NOTE — Telephone Encounter (Signed)
I could not reach the patient yesterday after she called, and I tried calling her today to review her MRI results. She was not able to answer and I left another message for the patient today.

## 2016-03-01 NOTE — Progress Notes (Signed)
[place] [phone]   [phone]  CONSULT NOTE  REFERRING PHYSICIAN: Dr. Ledon Molina  REASON FOR CONSULTATION:  60 years old white female recently diagnosed with lung cancer.  HPI Jenna Molina is a 60 y.o. female was past medical history significant for history of cervical cancer status post total abdominal hysterectomy, chronic migraine, irritable bowel syndrome, hypothyroidism, vitamin D deficiency, depression and fibromyalgia. The patient mentioned that few weeks ago she started having headache as well as disease spells and difficulty typing with her right hand. She was seen by her primary care physician and had MRI of the brain performed at [facility] on 02/01/2016 and it showed 9 enhancing brain lesions consistent with metastases of variable size (6 supratentorial and 3 and frightened total). The lesion in the left cerebellar hemisphere is partially hemorrhagic. No hydrocephalus or herniation at this time. She had CT scan of the chest, abdomen and pelvis performed on 02/06/2016 and it showed left upper lobe mass with surrounding satellite nodularity concerning for primary bronchogenic carcinoma. The mass measured 3.8 x 2.5 x 1.7 cm. Those miliary pattern of innumerable bilateral lung nodules with differential including miliary metastatic disease, diffuse infectious or inflammatory process or motor granulomatous infection. There was also left hilar lymphadenopathy and a small left pleural effusion. There was ill-defined sclerosis in the inferior plate of L1 concerning for bony metastatic disease along with a sclerotic lesion in the T2 vertebral body. The patient was referred to Dr. Tammi Molina and a PET scan was performed on 02/14/2016 and it showed hypermetabolic 3.3 cm spiculated left upper lobe mass consistent with primary bronchogenic carcinoma. There was also hypermetabolic left hilar lymphadenopathy consistent with metastatic disease and no  hypermetabolic mediastinal lymph nodes. There is hypermetabolic bone metastases in the L1 vertebral body and the right posterior ilium. There was no evidence of soft tissue metastatic disease within the neck, abdomen or pelvis. There was also incidentally noted left cerebellar brain metastasis as seen on the previous MRI of the brain. On 02/21/2016 the patient underwent CT-guided core biopsy of the left upper lobe mass by interventional radiology and the final pathology ([id number]) was consistent with adenocarcinoma. The tissue block was sent to [facility] one for molecular studies but the results are still pending. The patient also had repeat MRI of the brain on 02/28/2016 and it showed multiple metastatic disease to the brain. 16 lesions were identified. Note is made of possible metastatic disease in the right temporal horn along the ependyma of the lateral ventricle. There is also hemorrhagic lesion in the left cerebellum with extensive surrounding edema. There is also extensive edema around the left parietal lesion which showed mild hemorrhage. Dr. Tammi Molina kindly referred the patient to me today for evaluation and discussion of her treatment options. When seen today the patient continues to complain of fatigue and weakness as well as intermittent headache. She has mild shortness of breath but no significant chest pain or hemoptysis. She has no nausea, vomiting, diarrhea or constipation. She denied having any significant fever or chills. She lost few pounds recently. Family history significant for mother with lung cancer and ovarian cancer. Father had COPD and alcohol abuse. [name] and has no children. She was accompanied by her husband Jenna Molina. She is currently on disability secondary to fibromyalgia. She has a history of smoking for around 30 years but quit in 2010. No history of alcohol or drug abuse.  HPI  Past Medical History:  Diagnosis Date  .  Adenocarcinoma of left lung, stage  4 ([place]) 03/01/2016  . Anxiety   . Cancer (HCC)    Cervical cancer, lung cancer   . Chronic bronchitis ([place])   . Depression   . Fibromyalgia   . GERD (gastroesophageal reflux disease)   . Heart murmur    Mitral Valve prolapse  . Hypothyroidism   . Irritable bowel    constipation  . Migraines   . Neuromuscular disorder ([place])   . Pneumonia     Past Surgical History:  Procedure Laterality Date  . ABDOMINAL HYSTERECTOMY    . APPENDECTOMY    . CHOLECYSTECTOMY N/A 04/01/2014   Procedure: LAPAROSCOPIC CHOLECYSTECTOMY WITH INTRAOPERATIVE CHOLANGIOGRAM;  Surgeon: Jenna Confer, MD;  Location: [facility];  Service: General;  Laterality: N/A;  . ERCP N/A 04/02/2014   Procedure: ENDOSCOPIC RETROGRADE CHOLANGIOPANCREATOGRAPHY (ERCP);  Surgeon: Jenna Banister, MD;  Location: [facility];  Service: Gastroenterology;  Laterality: N/A;  . INNER EAR SURGERY    . MASTOIDECTOMY    . MOLE REMOVAL    . RADIOLOGY WITH ANESTHESIA N/A 02/28/2016   Procedure: MRI OF THE BRAIN WITH AND WITHOUT;  Surgeon: Medication Radiologist, MD;  Location: [place];  Service: Radiology;  Laterality: N/A;    Family History  Problem Relation Age of Onset  . Cancer Mother   . Diabetes Mother   . Anxiety disorder Mother   . Emphysema Father   . Anxiety disorder Father   . Alcohol abuse Father   . COPD Father     Social History Social History  Substance Use Topics  . Smoking status: Former Smoker    Quit date: 06/19/2008  . Smokeless tobacco: Never Used  . Alcohol use No    Allergies  Allergen Reactions  . Aspirin Nausea Only  . Lyrica [Pregabalin]     Involuntary movement  . Sulfa Antibiotics Hives  . Adhesive [Tape] Rash  . Neosporin [Neomycin-Bacitracin Zn-Polymyx] Rash    Current Outpatient Prescriptions  Medication Sig Dispense Refill  . Acetaminophen 500 MG coapsule Take 250-500 mg by mouth 3 (three) times daily. Takes 280m at 0030 and 0800 and 503mat bedtime (1430)    . cholecalciferol (VITAMIN D)  1000 UNITS tablet Take 5,000 Units by mouth every morning.    . citalopram (CELEXA) 40 MG tablet Take 60 mg by mouth at bedtime.     . conjugated estrogens (PREMARIN) vaginal cream Place 1 Applicatorful vaginally 2 (two) times a week.    . cyclobenzaprine (FLEXERIL) 10 MG tablet Take 1 tablet (10 mg total) by mouth 3 (three) times daily as needed for muscle spasms. 90 tablet 4  . dexamethasone (DECADRON) 4 MG tablet Take 1 tablet (4 mg total) by mouth 4 (four) times daily. 60 tablet 0  . HYDROcodone-acetaminophen (NORCO) 10-325 MG tablet Take 2 tablets by mouth every 8 (eight) hours. 180 tablet 0  . levothyroxine (SYNTHROID, LEVOTHROID) 100 MCG tablet Take 100 mcg by mouth every morning.    . Marland KitchenORazepam (ATIVAN) 1 MG tablet Take 1 mg by mouth every 6 (six) hours.    . Vladimir Fasterlycol-Propyl Glycol (SYSTANE OP) Place 2 drops into both eyes daily as needed (dryness).    . promethazine (PHENERGAN) 25 MG tablet Take 25 mg by mouth every 6 (six) hours as needed. With Imitrex For nausea    . SUMAtriptan (IMITREX) 100 MG tablet Take 100 mg by mouth every 2 (two) hours as needed for migraine. May repeat in 2 hours if headache persists or recurs.    .Marland Kitchen  SUMAtriptan (IMITREX) 6 MG/0.5ML SOLN injection Inject 0.5 mLs (6 mg total) into the skin every 2 (two) hours as needed for migraine or headache. May repeat in 2 hours if headache persists or recurs. 5 vial 2  . topiramate (TOPAMAX) 200 MG tablet Take 200 mg by mouth every morning.    . vitamin E 400 UNIT capsule Take 400 Units by mouth every morning.     No current facility-administered medications for this visit.     Review of Systems  Constitutional: positive for fatigue and weight loss Eyes: negative Ears, nose, mouth, throat, and face: negative Respiratory: positive for cough and dyspnea on exertion Cardiovascular: negative Gastrointestinal: negative Genitourinary:negative Integument/breast: negative Hematologic/lymphatic:  negative Musculoskeletal:positive for muscle weakness Neurological: positive for coordination problems and headaches Behavioral/Psych: negative Endocrine: negative Allergic/Immunologic: negative  Physical Exam  INO:MVEHM, healthy, no distress, well nourished, well developed and anxious SKIN: skin color, texture, turgor are normal, no rashes or significant lesions HEAD: Normocephalic, No masses, lesions, tenderness or abnormalities EYES: normal, PERRLA, Conjunctiva are pink and non-injected EARS: External ears normal, Canals clear OROPHARYNX:no exudate, no erythema and lips, buccal mucosa, and tongue normal  NECK: supple, no adenopathy, no JVD LYMPH:  no palpable lymphadenopathy, no hepatosplenomegaly BREAST:not examined LUNGS: clear to auscultation , and palpation HEART: regular rate & rhythm, no murmurs and no gallops ABDOMEN:abdomen soft, non-tender, normal bowel sounds and no masses or organomegaly BACK: Back symmetric, no curvature., No CVA tenderness EXTREMITIES:no joint deformities, effusion, or inflammation, no edema, no skin discoloration  NEURO: alert & oriented x 3 with fluent speech, no focal motor/sensory deficits  PERFORMANCE STATUS: ECOG 1  LABORATORY DATA: Lab Results  Component Value Date   WBC 7.4 03/01/2016   HGB 12.4 03/01/2016   HCT 37.4 03/01/2016   MCV 86.0 03/01/2016   PLT 290 03/01/2016      Chemistry      Component Value Date/Time   NA 139 03/01/2016 1447   K 3.9 03/01/2016 1447   CL 102 01/11/2016 1037   CO2 23 03/01/2016 1447   BUN 15.5 03/01/2016 1447   CREATININE 1.0 03/01/2016 1447      Component Value Date/Time   CALCIUM 9.4 03/01/2016 1447   ALKPHOS 91 03/01/2016 1447   AST 19 03/01/2016 1447   ALT 24 03/01/2016 1447   BILITOT 0.33 03/01/2016 1447       RADIOGRAPHIC STUDIES: Mr Jeri Cos CN Contrast  Result Date: 02/28/2016 CLINICAL DATA:  Lung mass.  Metastatic disease to brain. EXAM: MRI HEAD WITHOUT AND WITH CONTRAST  TECHNIQUE: Multiplanar, multiecho pulse sequences of the brain and surrounding structures were obtained without and with intravenous contrast. CONTRAST:  56m MULTIHANCE GADOBENATE DIMEGLUMINE 529 MG/ML IV SOLN COMPARISON:  MRI head 02/03/2016 FINDINGS: Brain: Stereotactic radiosurgery protocol at 3 Tesla. Numerous metastatic deposits in the brain, described below. 2 mm lesion right lateral cerebellum 26 x 22 mm hemorrhagic lesion in the left cerebellum with surrounding edema. Fluid level is present from hemorrhage. Mild mass-effect on the fourth ventricle. 2 mm lesion left superior medial cerebellum 6 mm lesion right posterior lateral temporal lobe. 2 mm lesion left occipital lobe 14 mm lesion in the right mid temporal lobe. Adjacent this mass is enhancement within the right temporal horn which may be a met to the ependyma or intraventricular spread of tumor. 4 mm lesion right subcapital lobe. 4 mm lesion left occipital lobe 3 mm lesion left thalamus 2 mm lesion right parietal operculum 4 mm lesion right frontal lobe 7.5 mm  lesion right superior thalamus with surrounding edema 2 mm lesion right medial occipital lobe midline 6 mm necrotic lesion right medial occipital lobe 1 mm lesion right parietal lobe 16 x 25 mm lesion left parietal lobe with extensive surrounding vasogenic edema and local mass-effect. Mild internal hemorrhage. 2 mm lesion high left parietal lobe midline 2 mm lesion high right frontal lobe Ventricle size normal. No shift of the midline structures. Negative for acute infarct. Vascular: Normal arterial flow voids Skull and upper cervical spine: Negative Sinuses/Orbits: Negative Other: None IMPRESSION: Multiple metastatic disease to the brain. Sixteen lesions are identified. Note is made of possible metastatic disease in the right temporal horn along the ependyma of the lateral ventricle. Hemorrhagic lesion left cerebellum with extensive surrounding edema. There is also extensive edema around the  left parietal lesion which shows mild hemorrhage. Electronically Signed   By: Franchot Gallo M.D.   On: 02/28/2016 14:00   Nm Pet Image Initial (pi) Skull Base To Thigh  Result Date: 02/14/2016 CLINICAL DATA:  Initial treatment strategy for left lung carcinoma with brain metastases. Personal history of cervical carcinoma. EXAM: NUCLEAR MEDICINE PET SKULL BASE TO THIGH TECHNIQUE: 13.1 mCi F-18 FDG was injected intravenously. Full-ring PET imaging was performed from the skull base to thigh after the radiotracer. CT data was obtained and used for attenuation correction and anatomic localization. FASTING BLOOD GLUCOSE:  Value: 95 mg/dl COMPARISON:  CT from [facility] dated 02/07/2016 FINDINGS: NECK No hypermetabolic lymph nodes in the neck. Incidental note is made of brain metastasis in the left cerebellum, as shown on previous outside brain MRI on 02/03/2016. CHEST Spiculated mass in the left upper lobe measures 3.3 x 1.6 cm. This shows FDG uptake, with SUV max of 8.8. This is consistent with primary bronchogenic carcinoma. No evidence of pleural effusion. Asymmetric metabolic activity is seen in the left suprahilar region with SUV max of 3.8. This corresponds with mild left hilar lymphadenopathy, which is better visualized on recent contrast-enhanced chest CT. No hypermetabolic mediastinal lymph nodes identified. ABDOMEN/PELVIS No abnormal hypermetabolic activity within the liver, pancreas, adrenal glands, or spleen. No hypermetabolic lymph nodes in the abdomen or pelvis. Surgical clips seen from prior cholecystectomy. Patient has undergone previous hysterectomy and surgical clips are also seen from Previous pelvic lymph node dissection. SKELETON Hypermetabolic sclerotic bone metastasis is seen in the L1 vertebral body, with SUV max of 7.6. Hypermetabolic sclerotic bone metastasis also seen in the right posterior ilium, with SUV max of 6.3. A benign bone island is incidentally noted in the left posterior ilium  which shows no FDG uptake. IMPRESSION: Hypermetabolic 3.3 cm spiculated left upper lobe mass, consistent with primary bronchogenic carcinoma. Hypermetabolic left hilar lymphadenopathy, consistent with metastatic disease. No hypermetabolic mediastinal lymph nodes. Hypermetabolic bone metastases in the L1 vertebral body in right posterior ilium. No evidence of soft tissue metastatic disease within the neck, abdomen, or pelvis. Incidentally noted left cerebellar brain metastasis, as demonstrated on recent outside brain MRI. Electronically Signed   By: Earle Gell M.D.   On: 02/14/2016 13:45   Ct Biopsy  Result Date: 02/17/2016 INDICATION: METASTATIC LUNG CANCER EXAM: CT-GUIDED BIOPSY LEFT UPPER LOBE MASS MEDICATIONS: 1% lidocaine locally ANESTHESIA/SEDATION: 2.0 mg IV Versed; 100 mcg IV Fentanyl Moderate Sedation Time:  13 minutes The patient was continuously monitored during the procedure by the interventional radiology nurse under my direct supervision. PROCEDURE: The procedure, risks, benefits, and alternatives were explained to the patient. Questions regarding the procedure were encouraged and answered. The patient  understands and consents to the procedure. Previous imaging reviewed. Patient position its supine/slightly left anterior oblique. Noncontrast localization CT performed. The left upper lobe nodular mass was localized. Overlying skin was marked. The left chest/ axillary region was prepped with ChloraPrep in a sterile fashion, and a sterile drape was applied covering the operative field. A sterile gown and sterile gloves were used for the procedure. Under CT guidance, a(n) 17 gauge guide needle was advanced into the left upper lobe mass. 2 18 gauge 1 cm core biopsies were obtained. The guide needle was removed with the aid of the biosentry device to prevent pneumothorax. Final imaging was performed. Patient tolerated the procedure well without complication. Vital sign monitoring by nursing staff during  the procedure will continue as patient is in the special procedures unit for post procedure observation. FINDINGS: The images document guide needle placement within the left upper lobe mass. Post biopsy images demonstrate no significant hemorrhage, effusion or pneumothorax. COMPLICATIONS: None immediate. IMPRESSION: Successful CT-guided core biopsy of the left upper lobe mass. Electronically Signed   By: Jerilynn Mages.  Shick M.D.   On: 02/17/2016 09:58   Dg Chest Port 1 View  Result Date: 02/17/2016 CLINICAL DATA:  Post left lung biopsy. EXAM: PORTABLE CHEST 1 VIEW COMPARISON:  Chest CT from earlier today FINDINGS: The patient's known nodule/ mass in the left upper lobe is again identified. No pneumothorax after biopsy. Elevation of the left hemidiaphragm persists. The cardiomediastinal silhouette is unchanged. No other interval changes. IMPRESSION: No pneumothorax after nodule/mass biopsy. Electronically Signed   By: Dorise Bullion III M.D   On: 02/17/2016 11:17   Mm Diag Breast Tomo Bilateral  Result Date: 02/06/2016 CLINICAL DATA:  Recent MRI demonstrated rain lesions-possible metastases.Mammogram requested. EXAM: 2D DIGITAL DIAGNOSTIC BILATERAL MAMMOGRAM WITH CAD AND ADJUNCT TOMO COMPARISON:  Previous examinations most recent which is dated 09/07/2014. ACR Breast Density Category b: There are scattered areas of fibroglandular density. FINDINGS: There is no mass, distortion, or worrisome calcification within either breast. The parenchymal pattern is stable. Mammographic images were processed with CAD. IMPRESSION: No findings worrisome for developing malignancy. RECOMMENDATION: Screening mammography in 1 year. I have discussed the findings and recommendations with the patient. Results were also provided in writing at the conclusion of the visit. If applicable, a reminder letter will be sent to the patient regarding the next appointment. BI-RADS CATEGORY  1: Negative. Electronically Signed   By: Altamese Cabal  M.D.   On: 02/06/2016 08:36   Mr Outside Films Head/face  Result Date: 02/10/2016 CLINICAL DATA:  This exam is stored here for comparison purposes only and was performed at an outside facility.   Please contact the originating institution for any associated interpretation or report.   Ct Outside Films Body  Result Date: 02/10/2016 CLINICAL DATA:  This exam is stored here for comparison purposes only and was performed at an outside facility.   Please contact the originating institution for any associated interpretation or report.    ASSESSMENT: This is a very pleasant 60 years old white female recently diagnosed with a stage IV (T2a, N1, M1 B) non-small cell lung cancer, adenocarcinoma presented with left upper lobe lung mass in addition to left hilar adenopathy and multiple metastatic brain lesion as well as metastatic bone disease diagnosed in November 2017.   PLAN: I had a lengthy discussion with the patient and her husband today about her current disease stage, prognosis and treatment options. I explained to the patient and her husband that she has incurable  condition and also treatment will be off palliative nature. I recommended for the patient to continue on Decadron 4 mg by mouth every 6 hours for now until completion of the radiotherapy to the brain. She was seen by Dr. Tammi Molina and expected to start brain irradiation soon. Molecular studies are still pending and the results are expected on 03/07/2016. The patient is currently asymptomatic except from the brain metastasis and she can wait until the results become available before starting any systemic therapy. She is planning to go out of town to visit family in [place] next week. I will arrange for the patient to come back for follow-up visit in 2 weeks for reevaluation and more detailed discussion of her treatment options based on the molecular studies. The patient voices understanding of current disease status and treatment options and  is in agreement with the current care plan.  All questions were answered. The patient knows to call the clinic with any problems, questions or concerns. We can certainly see the patient much sooner if necessary.  Thank you so much for allowing me to participate in the care of [place]. I will continue to follow up the patient with you and assist in her care.  I spent 40 minutes counseling the patient face to face. The total time spent in the appointment was 60 minutes.  Disclaimer: This note was dictated with voice recognition software. Similar sounding words can inadvertently be transcribed and may not be corrected upon review.   Lavita Pontius K. March 01, 2016, 4:36 PM

## 2016-03-01 NOTE — Telephone Encounter (Addendum)
I called the patient back again to try and review her MRI results and again I was unable to reach her and left a message for her to return our call.

## 2016-03-01 NOTE — Telephone Encounter (Signed)
Gave patient avs report and appointments for November.  °

## 2016-03-02 ENCOUNTER — Encounter: Payer: Self-pay | Admitting: Physical Medicine & Rehabilitation

## 2016-03-05 ENCOUNTER — Telehealth: Payer: Self-pay | Admitting: Radiation Oncology

## 2016-03-05 NOTE — Telephone Encounter (Addendum)
I called trying to reach the patient regarding her MRI results. I again had to leave a message for her to return my call.  The patient called me back and we reviewed the rationale for whole brain radiotherapy given the findings on her MRI scan. Dr. Tammi Klippel would recommend 10 fractions of treatment. We discussed risks, benefits, logistics, and the making of the mask for treatment. She will come Wednesday at 1pm for simulation.

## 2016-03-06 ENCOUNTER — Encounter: Payer: Self-pay | Admitting: Physical Medicine & Rehabilitation

## 2016-03-06 ENCOUNTER — Encounter: Payer: Medicare Other | Admitting: Registered Nurse

## 2016-03-06 ENCOUNTER — Encounter: Payer: Medicare Other | Attending: Physical Medicine & Rehabilitation | Admitting: Physical Medicine & Rehabilitation

## 2016-03-06 VITALS — BP 110/78 | HR 92 | Resp 16

## 2016-03-06 DIAGNOSIS — G894 Chronic pain syndrome: Secondary | ICD-10-CM | POA: Diagnosis present

## 2016-03-06 DIAGNOSIS — M797 Fibromyalgia: Secondary | ICD-10-CM

## 2016-03-06 DIAGNOSIS — C3492 Malignant neoplasm of unspecified part of left bronchus or lung: Secondary | ICD-10-CM | POA: Diagnosis not present

## 2016-03-06 DIAGNOSIS — G43001 Migraine without aura, not intractable, with status migrainosus: Secondary | ICD-10-CM | POA: Diagnosis not present

## 2016-03-06 DIAGNOSIS — Z5181 Encounter for therapeutic drug level monitoring: Secondary | ICD-10-CM | POA: Insufficient documentation

## 2016-03-06 DIAGNOSIS — Z79899 Other long term (current) drug therapy: Secondary | ICD-10-CM | POA: Diagnosis present

## 2016-03-06 DIAGNOSIS — M791 Myalgia: Secondary | ICD-10-CM | POA: Diagnosis present

## 2016-03-06 DIAGNOSIS — M609 Myositis, unspecified: Secondary | ICD-10-CM | POA: Diagnosis present

## 2016-03-06 MED ORDER — HYDROCODONE-ACETAMINOPHEN 10-325 MG PO TABS: 2.0000 | ORAL_TABLET | Freq: Three times a day (TID) | ORAL | 0 refills | Status: DC

## 2016-03-06 NOTE — Patient Instructions (Signed)
PLEASE CALL ME WITH ANY PROBLEMS OR QUESTIONS ([phone])   HAPPY THANKSGIVING!!!!

## 2016-03-06 NOTE — Progress Notes (Signed)
Subjective:    Patient ID: Jenna Molina, female    DOB: Dec 10, 1955, 60 y.o.   MRN: [id number]  HPI   Jenna Molina is here in follow up her chronic pain. With her ongoing dizziness, her primary ordered an MRI of her brain which revealed brain lesions. Primary turned out to be lung. She is being treated by Dr. Earlie Server currently. Chemotherapy is being considered as well as XRT. She is not sure that she wants radiation.   Her pain levels have been a little elevated. She has noticed that she has had more headaches. Her fatigue continues. Her primary increased her celexa to '60mg'$  to support her mood.  Pain Inventory Average Pain 6 Pain Right Now 8 My pain is sharp, burning, dull, stabbing and aching  In the last 24 hours, has pain interfered with the following? General activity 7 Relation with others 7 Enjoyment of life 7 What TIME of day is your pain at its worst? morning and evening Sleep (in general) Fair  Pain is worse with: walking, bending, sitting, inactivity, standing and some activites Pain improves with: rest, heat/ice, therapy/exercise, pacing activities, medication and TENS Relief from Meds: 5  Mobility Do you have any goals in this area?  no  Function disabled: date disabled .  Neuro/Psych numbness tremor tingling trouble walking spasms dizziness confusion  Prior Studies Any changes since last visit?  yes new diagnosis of lung ca with brain mets  Physicians involved in your care Any changes since last visit?  no   Family History  Problem Relation Age of Onset  . Cancer Mother   . Diabetes Mother   . Anxiety disorder Mother   . Emphysema Father   . Anxiety disorder Father   . Alcohol abuse Father   . COPD Father    Social History   Social History  . Marital status: Married    Spouse name: N/A  . Number of children: N/A  . Years of education: N/A   Social History Main Topics  . Smoking status: Former Smoker    Quit date: 06/19/2008  . Smokeless  tobacco: Never Used  . Alcohol use No  . Drug use: No  . Sexual activity: Yes   Other Topics Concern  . None   Social History Narrative  . None   Past Surgical History:  Procedure Laterality Date  . ABDOMINAL HYSTERECTOMY    . APPENDECTOMY    . CHOLECYSTECTOMY N/A 04/01/2014   Procedure: LAPAROSCOPIC CHOLECYSTECTOMY WITH INTRAOPERATIVE CHOLANGIOGRAM;  Surgeon: Jackolyn Confer, MD;  Location: [facility];  Service: General;  Laterality: N/A;  . ERCP N/A 04/02/2014   Procedure: ENDOSCOPIC RETROGRADE CHOLANGIOPANCREATOGRAPHY (ERCP);  Surgeon: Milus Banister, MD;  Location: [facility];  Service: Gastroenterology;  Laterality: N/A;  . INNER EAR SURGERY    . MASTOIDECTOMY    . MOLE REMOVAL    . RADIOLOGY WITH ANESTHESIA N/A 02/28/2016   Procedure: MRI OF THE BRAIN WITH AND WITHOUT;  Surgeon: Medication Radiologist, MD;  Location: [place];  Service: Radiology;  Laterality: N/A;   Past Medical History:  Diagnosis Date  . Adenocarcinoma of left lung, stage 4 ([place]) 03/01/2016  . Anxiety   . Cancer (HCC)    Cervical cancer, lung cancer   . Chronic bronchitis ([place])   . Depression   . Fibromyalgia   . GERD (gastroesophageal reflux disease)   . Heart murmur    Mitral Valve prolapse  . Hypothyroidism   . Irritable bowel    constipation  .  Migraines   . Neuromuscular disorder ([place])   . Pneumonia    BP 110/78   Pulse 92   Resp 16   SpO2 96%   Opioid Risk Score:   Fall Risk Score:  `1  Depression screen PHQ 2/9  Depression screen [facility] 2/9 03/06/2016 01/11/2016 09/20/2015 07/20/2015 05/24/2015 12/06/2014 10/12/2014  Decreased Interest 2 2 0 '1 3 3 2  '$ Down, Depressed, Hopeless 2 2 0 '1 3 3 2  '$ PHQ - 2 Score 4 4 0 '2 6 6 4  '$ Altered sleeping - - - 0 - - 2  Tired, decreased energy - - - 1 - - 2  Change in appetite - - - 2 - - 1  Feeling bad or failure about yourself  - - - 0 - - 1  Trouble concentrating - - - 0 - - 0  Moving slowly or fidgety/restless - - - 0 - - 0  Suicidal thoughts - - - 0 - - 1    PHQ-9 Score - - - 5 - - 11    Review of Systems  Constitutional: Positive for appetite change and unexpected weight change.  HENT: Negative.   Eyes: Negative.   Respiratory: Negative.   Cardiovascular: Negative.   Gastrointestinal: Negative.   Genitourinary: Negative.   Musculoskeletal: Negative.   Allergic/Immunologic: Negative.   Neurological: Positive for headaches.  Hematological: Negative.   Psychiatric/Behavioral: Negative.   All other systems reviewed and are negative.      Objective:   Physical Exam   General: Ax0 x3 HEENT:PERRL Neck:Supple without JVD or lymphadenopathy  Heart:RRR  Chest:CTA  Abdomen: non distended  Extremities:No clubbing, cyanosis, or edema. Pulses are 2+  Skin:Clean and intact without signs of breakdown  Neuro:CN intact. motor 5/5. Sensory normal Musculoskeletal:some soreness along low back with flexion.  Psych:Pt in fair spirits.    Assessment & Plan:  1. FMS with associated symptoms including: chronic fatigue, depression, migraine headaches, RLS, TMJ, numerous tender points and trigger points.  2. Hx of major depression  3. Hypothyroid 4. 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 Refilled hydrocodone 10/325 one to 2 q8 prn #180. I gave her a second rx for next month.   -consider long acting  -asked the patient to call me if she needs anything.  -I will be happy to help manage her pain as long as appropriate 2. Sq imitrex for severe, breakthrough migraine treatment. Maxalt for initial oral treatment of migraine.  3. Stress/anxiety mgt remains key. Celexa at '60mg'$  daily 4. Cancer treatment per onc/rad-onc  5. Follow up with me in two months. 15 minutes of face to face patient care time were spent during this visit. All questions were encouraged and answered.

## 2016-03-07 ENCOUNTER — Telehealth: Payer: Self-pay | Admitting: Oncology

## 2016-03-07 ENCOUNTER — Encounter (HOSPITAL_COMMUNITY): Payer: Self-pay

## 2016-03-07 ENCOUNTER — Ambulatory Visit
Admission: RE | Admit: 2016-03-07 | Discharge: 2016-03-07 | Disposition: A | Discharge status: Home / Self Care | Payer: Medicare Other | Source: Ambulatory Visit | Attending: Radiation Oncology | Admitting: Radiation Oncology

## 2016-03-07 DIAGNOSIS — C3412 Malignant neoplasm of upper lobe, left bronchus or lung: Secondary | ICD-10-CM | POA: Insufficient documentation

## 2016-03-07 DIAGNOSIS — Z510 Encounter for antineoplastic radiation therapy: Secondary | ICD-10-CM | POA: Insufficient documentation

## 2016-03-07 DIAGNOSIS — C7931 Secondary malignant neoplasm of brain: Secondary | ICD-10-CM

## 2016-03-07 NOTE — Telephone Encounter (Signed)
Patient called and said she was not able to come in for her appointment with [name], PA today.  She said she was tired and not feeling well.  Transferred her to [place], Art therapist to reschedule.

## 2016-03-07 NOTE — Progress Notes (Signed)
Rescheduled

## 2016-03-11 ENCOUNTER — Encounter: Payer: Self-pay | Admitting: Physical Medicine & Rehabilitation

## 2016-03-12 ENCOUNTER — Telehealth: Payer: Self-pay | Admitting: Medical Oncology

## 2016-03-12 NOTE — Telephone Encounter (Signed)
I returned pts call. " I have not been to radiation oncology because I am not sure what I am going to do , I need more information. I don't remember what Dr [name]  told me  about my  time frame , my lifespan with , without treatment" . She is not sure she wants to go through tx and side effects ( many people have told her she will be sick) She also wants to know molecular results?

## 2016-03-12 NOTE — Telephone Encounter (Signed)
We can schedule another appointment for her later this week for further discussion of her treatment options. Molecular studies did not show any of the markers I was looking for.

## 2016-03-13 ENCOUNTER — Ambulatory Visit: Payer: Medicare Other | Admitting: Physical Medicine & Rehabilitation

## 2016-03-13 NOTE — Telephone Encounter (Signed)
Pt notified.[name] will see her Thursday.

## 2016-03-13 NOTE — Telephone Encounter (Signed)
Schedule message sent for appt.

## 2016-03-14 ENCOUNTER — Telehealth: Payer: Self-pay

## 2016-03-14 NOTE — Telephone Encounter (Signed)
Patient has called and stated that she has an increase in pain and that her lower left side is hurting more frequently. Patient also stated that every 4-6 hours she is taking 1 to 2 pills.  Please advise.

## 2016-03-15 ENCOUNTER — Encounter: Payer: Self-pay | Admitting: Internal Medicine

## 2016-03-15 ENCOUNTER — Other Ambulatory Visit (HOSPITAL_BASED_OUTPATIENT_CLINIC_OR_DEPARTMENT_OTHER): Payer: Medicare Other

## 2016-03-15 ENCOUNTER — Ambulatory Visit (HOSPITAL_BASED_OUTPATIENT_CLINIC_OR_DEPARTMENT_OTHER): Payer: Medicare Other | Admitting: Internal Medicine

## 2016-03-15 VITALS — BP 118/62 | HR 88 | Temp 98.9°F | Resp 18 | Ht 64.0 in | Wt 126.7 lb

## 2016-03-15 DIAGNOSIS — C7931 Secondary malignant neoplasm of brain: Secondary | ICD-10-CM

## 2016-03-15 DIAGNOSIS — R609 Edema, unspecified: Secondary | ICD-10-CM

## 2016-03-15 DIAGNOSIS — C3492 Malignant neoplasm of unspecified part of left bronchus or lung: Secondary | ICD-10-CM

## 2016-03-15 LAB — COMPREHENSIVE METABOLIC PANEL
ALBUMIN: 3.8 g/dL (ref 3.5–5.0)
ALK PHOS: 90 U/L (ref 40–150)
ALT: 31 U/L (ref 0–55)
AST: 15 U/L (ref 5–34)
Anion Gap: 7 mEq/L (ref 3–11)
BUN: 22.4 mg/dL (ref 7.0–26.0)
CALCIUM: 9.5 mg/dL (ref 8.4–10.4)
CO2: 27 mEq/L (ref 22–29)
Chloride: 102 mEq/L (ref 98–109)
Creatinine: 0.9 mg/dL (ref 0.6–1.1)
EGFR: 72 mL/min/{1.73_m2} — ABNORMAL LOW (ref >90)
Glucose: 123 mg/dl (ref 70–140)
POTASSIUM: 4.3 meq/L (ref 3.5–5.1)
Sodium: 136 mEq/L (ref 136–145)
Total Bilirubin: 0.22 mg/dL (ref 0.20–1.20)
Total Protein: 6.7 g/dL (ref 6.4–8.3)

## 2016-03-15 LAB — CBC WITH DIFFERENTIAL/PLATELET
BASO%: 0.1 % (ref 0.0–2.0)
BASOS ABS: 0 10*3/uL (ref 0.0–0.1)
EOS ABS: 0 10*3/uL (ref 0.0–0.5)
EOS%: 0.2 % (ref 0.0–7.0)
HEMATOCRIT: 37.4 % (ref 34.8–46.6)
HEMOGLOBIN: 12 g/dL (ref 11.6–15.9)
LYMPH#: 1.3 10*3/uL (ref 0.9–3.3)
LYMPH%: 8.7 % — ABNORMAL LOW (ref 14.0–49.7)
MCH: 28.1 pg (ref 25.1–34.0)
MCHC: 32.1 g/dL (ref 31.5–36.0)
MCV: 87.6 fL (ref 79.5–101.0)
MONO#: 1.1 10*3/uL — AB (ref 0.1–0.9)
MONO%: 7.6 % (ref 0.0–14.0)
NEUT#: 12.5 10*3/uL — ABNORMAL HIGH (ref 1.5–6.5)
NEUT%: 83.4 % — ABNORMAL HIGH (ref 38.4–76.8)
Platelets: 353 10*3/uL (ref 145–400)
RBC: 4.27 10*6/uL (ref 3.70–5.45)
RDW: 14.5 % (ref 11.2–14.5)
WBC: 15 10*3/uL — ABNORMAL HIGH (ref 3.9–10.3)

## 2016-03-15 NOTE — Progress Notes (Signed)
[place] [phone]   [phone]  OFFICE PROGRESS NOTE  Tawanna Solo, MD [place] [address]  DIAGNOSIS: Stage IV (T2a, N1, M1 B) non-small cell lung cancer, adenocarcinoma presented with left upper lobe lung mass in addition to left hilar adenopathy and multiple metastatic brain lesion as well as metastatic bone disease diagnosed in November 2017.  Genomic Alterations Identified? ERBB2 amplification - equivocal? STK11 K146* KRAS G12D KEAP1 Q359* LYN amplification MCL1 amplification - equivocal? Additional Findings? Microsatellite status MS-Stable Tumor Mutation Burden TMB-Intermediate; 15 Muts/Mb Additional Disease-relevant Genes with No Reportable Alterations Identified? EGFR ALK BRAF MET RET ROS1  PRIOR THERAPY: None.  CURRENT THERAPY: None.  INTERVAL HISTORY: Jenna Molina 60 y.o. female returns to the clinic today for follow-up visit accompanied by her husband. The patient continues to have headache and she resumed her treatment with Decadron and felt much better. She was unable to travel to [place] but she enjoyed her Thanksgiving with her husband. She denied having any current chest pain, shortness of breath, cough or hemoptysis. She has no nausea or vomiting, no fever or chills. She had molecular studies by [facility] one performed recently and unfortunately it showed no evidence for actionable mutations. The patient is here today for evaluation and discussion of her treatment options.  MEDICAL HISTORY: Past Medical History:  Diagnosis Date  . Adenocarcinoma of left lung, stage 4 ([place]) 03/01/2016  . Anxiety   . Cancer (HCC)    Cervical cancer, lung cancer   . Chronic bronchitis ([place])   . Depression   . Fibromyalgia   . GERD (gastroesophageal reflux disease)   . Heart murmur    Mitral Valve prolapse  . Hypothyroidism   . Irritable bowel    constipation  . Migraines   . Neuromuscular disorder  ([place])   . Pneumonia     ALLERGIES:  is allergic to aspirin; lyrica [pregabalin]; sulfa antibiotics; adhesive [tape]; and neosporin [neomycin-bacitracin zn-polymyx].  MEDICATIONS:  Current Outpatient Prescriptions  Medication Sig Dispense Refill  . Acetaminophen 500 MG coapsule Take 250-500 mg by mouth 3 (three) times daily. Takes 236m at 0030 and 0800 and 5043mat bedtime (1430)    . cholecalciferol (VITAMIN D) 1000 UNITS tablet Take 5,000 Units by mouth every morning.    . citalopram (CELEXA) 40 MG tablet Take 60 mg by mouth at bedtime.     . conjugated estrogens (PREMARIN) vaginal cream Place 1 Applicatorful vaginally 2 (two) times a week.    . cyclobenzaprine (FLEXERIL) 10 MG tablet Take 1 tablet (10 mg total) by mouth 3 (three) times daily as needed for muscle spasms. 90 tablet 4  . dexamethasone (DECADRON) 4 MG tablet Take 1 tablet (4 mg total) by mouth 4 (four) times daily. 60 tablet 0  . HYDROcodone-acetaminophen (NORCO) 10-325 MG tablet Take 2 tablets by mouth every 8 (eight) hours. 180 tablet 0  . levothyroxine (SYNTHROID, LEVOTHROID) 100 MCG tablet Take 100 mcg by mouth every morning.    . Marland KitchenORazepam (ATIVAN) 1 MG tablet Take 1 mg by mouth every 6 (six) hours.    . Vladimir Fasterlycol-Propyl Glycol (SYSTANE OP) Place 2 drops into both eyes daily as needed (dryness).    . promethazine (PHENERGAN) 25 MG tablet Take 25 mg by mouth every 6 (six) hours as needed. With Imitrex For nausea    . SUMAtriptan (IMITREX) 100 MG tablet Take 100 mg by mouth every 2 (two) hours as needed for migraine. May repeat  in 2 hours if headache persists or recurs.    . SUMAtriptan (IMITREX) 6 MG/0.5ML SOLN injection Inject 0.5 mLs (6 mg total) into the skin every 2 (two) hours as needed for migraine or headache. May repeat in 2 hours if headache persists or recurs. 5 vial 2  . topiramate (TOPAMAX) 200 MG tablet Take 200 mg by mouth every morning.    . vitamin E 400 UNIT capsule Take 400 Units by mouth every  morning.     No current facility-administered medications for this visit.     SURGICAL HISTORY:  Past Surgical History:  Procedure Laterality Date  . ABDOMINAL HYSTERECTOMY    . APPENDECTOMY    . CHOLECYSTECTOMY N/A 04/01/2014   Procedure: LAPAROSCOPIC CHOLECYSTECTOMY WITH INTRAOPERATIVE CHOLANGIOGRAM;  Surgeon: Jackolyn Confer, MD;  [facility];  Service: General;  Laterality: N/A;  . ERCP N/A 04/02/2014   Procedure: ENDOSCOPIC RETROGRADE CHOLANGIOPANCREATOGRAPHY (ERCP);  Surgeon: Milus Banister, MD;  [facility];  Service: Gastroenterology;  Laterality: N/A;  . INNER EAR SURGERY    . MASTOIDECTOMY    . MOLE REMOVAL    . RADIOLOGY WITH ANESTHESIA N/A 02/28/2016   Procedure: MRI OF THE BRAIN WITH AND WITHOUT;  Surgeon: Medication Radiologist, MD;  Location: [place];  Service: Radiology;  Laterality: N/A;    REVIEW OF SYSTEMS:  Constitutional: negative Eyes: negative Ears, nose, mouth, throat, and face: negative Respiratory: negative Cardiovascular: negative Gastrointestinal: negative Genitourinary:negative Integument/breast: negative Hematologic/lymphatic: negative Musculoskeletal:negative Neurological: positive for headaches Behavioral/Psych: negative Endocrine: negative Allergic/Immunologic: negative   PHYSICAL EXAMINATION: General appearance: alert, cooperative, fatigued and no distress Head: Normocephalic, without obvious abnormality, atraumatic Neck: no adenopathy, no JVD, supple, symmetrical, trachea midline and thyroid not enlarged, symmetric, no tenderness/mass/nodules Lymph nodes: Cervical, supraclavicular, and axillary nodes normal. Resp: normal percussion bilaterally Back: symmetric, no curvature. ROM normal. No CVA tenderness. Cardio: regular rate and rhythm, S1, S2 normal, no murmur, click, rub or gallop GI: soft, non-tender; bowel sounds normal; no masses,  no organomegaly Extremities: extremities normal, atraumatic, no cyanosis or edema Neurologic:  Alert and oriented X 3, normal strength and tone. Normal symmetric reflexes. Normal coordination and gait  ECOG PERFORMANCE STATUS: 1 - Symptomatic but completely ambulatory  Blood pressure 118/62, pulse 88, temperature 98.9 F (37.2 C), temperature source Oral, resp. rate 18, height 5' 4" (1.626 m), weight 126 lb 11.2 oz (57.5 kg), SpO2 97 %.  LABORATORY DATA: Lab Results  Component Value Date   WBC 15.0 (H) 03/15/2016   HGB 12.0 03/15/2016   HCT 37.4 03/15/2016   MCV 87.6 03/15/2016   PLT 353 03/15/2016      Chemistry      Component Value Date/Time   NA 139 03/01/2016 1447   K 3.9 03/01/2016 1447   CL 102 01/11/2016 1037   CO2 23 03/01/2016 1447   BUN 15.5 03/01/2016 1447   CREATININE 1.0 03/01/2016 1447      Component Value Date/Time   CALCIUM 9.4 03/01/2016 1447   ALKPHOS 91 03/01/2016 1447   AST 19 03/01/2016 1447   ALT 24 03/01/2016 1447   BILITOT 0.33 03/01/2016 1447       RADIOGRAPHIC STUDIES: Mr Jeri Cos DJ Contrast  Result Date: 02/28/2016 CLINICAL DATA:  Lung mass.  Metastatic disease to brain. EXAM: MRI HEAD WITHOUT AND WITH CONTRAST TECHNIQUE: Multiplanar, multiecho pulse sequences of the brain and surrounding structures were obtained without and with intravenous contrast. CONTRAST:  66m MULTIHANCE GADOBENATE DIMEGLUMINE 529 MG/ML IV SOLN COMPARISON:  MRI head  02/03/2016 FINDINGS: Brain: Stereotactic radiosurgery protocol at 3 Tesla. Numerous metastatic deposits in the brain, described below. 2 mm lesion right lateral cerebellum 26 x 22 mm hemorrhagic lesion in the left cerebellum with surrounding edema. Fluid level is present from hemorrhage. Mild mass-effect on the fourth ventricle. 2 mm lesion left superior medial cerebellum 6 mm lesion right posterior lateral temporal lobe. 2 mm lesion left occipital lobe 14 mm lesion in the right mid temporal lobe. Adjacent this mass is enhancement within the right temporal horn which may be a met to the ependyma or  intraventricular spread of tumor. 4 mm lesion right subcapital lobe. 4 mm lesion left occipital lobe 3 mm lesion left thalamus 2 mm lesion right parietal operculum 4 mm lesion right frontal lobe 7.5 mm lesion right superior thalamus with surrounding edema 2 mm lesion right medial occipital lobe midline 6 mm necrotic lesion right medial occipital lobe 1 mm lesion right parietal lobe 16 x 25 mm lesion left parietal lobe with extensive surrounding vasogenic edema and local mass-effect. Mild internal hemorrhage. 2 mm lesion high left parietal lobe midline 2 mm lesion high right frontal lobe Ventricle size normal. No shift of the midline structures. Negative for acute infarct. Vascular: Normal arterial flow voids Skull and upper cervical spine: Negative Sinuses/Orbits: Negative Other: None IMPRESSION: Multiple metastatic disease to the brain. Sixteen lesions are identified. Note is made of possible metastatic disease in the right temporal horn along the ependyma of the lateral ventricle. Hemorrhagic lesion left cerebellum with extensive surrounding edema. There is also extensive edema around the left parietal lesion which shows mild hemorrhage. Electronically Signed   By: Franchot Gallo M.D.   On: 02/28/2016 14:00   Ct Biopsy  Result Date: 02/17/2016 INDICATION: METASTATIC LUNG CANCER EXAM: CT-GUIDED BIOPSY LEFT UPPER LOBE MASS MEDICATIONS: 1% lidocaine locally ANESTHESIA/SEDATION: 2.0 mg IV Versed; 100 mcg IV Fentanyl Moderate Sedation Time:  13 minutes The patient was continuously monitored during the procedure by the interventional radiology nurse under my direct supervision. PROCEDURE: The procedure, risks, benefits, and alternatives were explained to the patient. Questions regarding the procedure were encouraged and answered. The patient understands and consents to the procedure. Previous imaging reviewed. Patient position its supine/slightly left anterior oblique. Noncontrast localization CT performed. The left  upper lobe nodular mass was localized. Overlying skin was marked. The left chest/ axillary region was prepped with ChloraPrep in a sterile fashion, and a sterile drape was applied covering the operative field. A sterile gown and sterile gloves were used for the procedure. Under CT guidance, a(n) 17 gauge guide needle was advanced into the left upper lobe mass. 2 18 gauge 1 cm core biopsies were obtained. The guide needle was removed with the aid of the biosentry device to prevent pneumothorax. Final imaging was performed. Patient tolerated the procedure well without complication. Vital sign monitoring by nursing staff during the procedure will continue as patient is in the special procedures unit for post procedure observation. FINDINGS: The images document guide needle placement within the left upper lobe mass. Post biopsy images demonstrate no significant hemorrhage, effusion or pneumothorax. COMPLICATIONS: None immediate. IMPRESSION: Successful CT-guided core biopsy of the left upper lobe mass. Electronically Signed   By: Jerilynn Mages.  Shick M.D.   On: 02/17/2016 09:58   Dg Chest Port 1 View  Result Date: 02/17/2016 CLINICAL DATA:  Post left lung biopsy. EXAM: PORTABLE CHEST 1 VIEW COMPARISON:  Chest CT from earlier today FINDINGS: The patient's known nodule/ mass in the left upper lobe  is again identified. No pneumothorax after biopsy. Elevation of the left hemidiaphragm persists. The cardiomediastinal silhouette is unchanged. No other interval changes. IMPRESSION: No pneumothorax after nodule/mass biopsy. Electronically Signed   By: Dorise Bullion III M.D   On: 02/17/2016 11:17    ASSESSMENT AND PLAN: This is a very pleasant 60 years old white female recently diagnosed with a stage IV non-small cell lung cancer with multiple brain metastasis. Molecular study shows no evidence for EGFR, ALK, ROS 1 or BRAF.  I will I have a lengthy discussion with the patient and her husband today about her current condition and  treatment options. I strongly recommended for the patient to consider palliative radiotherapy to the metastatic brain lesions. I also discussed with the patient the option of palliative care and hospice referral versus consideration of palliative systemic chemotherapy. She still debating whether to receive treatment or not. She is considering alternative medicine and holistic approach. If the patient decided to proceed with systemic chemotherapy, she will be treated with carboplatin for AUC of 5, Alimta 500 MG/M2 plus/minus Avastin 15 MG/KG every 3 weeks. She would like to take some time to think about her option. She will call us with her decision. She will continue on Decadron for now for the vasogenic edema. I did not arrange a follow-up appointment for the patient until we hear from her regarding her final decision. The patient voices understanding of current disease status and treatment options and is in agreement with the current care plan.  All questions were answered. The patient knows to call the clinic with any problems, questions or concerns. We can certainly see the patient much sooner if necessary.  I spent 20 minutes counseling the patient face to face. The total time spent in the appointment was 30 minutes.  Disclaimer: This note was dictated with voice recognition software. Similar sounding words can inadvertently be transcribed and may not be corrected upon review.

## 2016-03-16 ENCOUNTER — Encounter (HOSPITAL_COMMUNITY): Payer: Self-pay

## 2016-03-16 ENCOUNTER — Other Ambulatory Visit: Payer: Self-pay | Admitting: Radiation Oncology

## 2016-03-16 MED ORDER — FENTANYL 25 MCG/HR TD PT72: 25.0000 ug | MEDICATED_PATCH | TRANSDERMAL | 0 refills | Status: DC

## 2016-03-16 NOTE — Telephone Encounter (Signed)
Fentanyl prescription printed  per Dr. Naaman Plummer instructions

## 2016-03-16 NOTE — Telephone Encounter (Signed)
Patient contacted office and instructed to come pick up script this afternoon, will need Zella Ball to write the script

## 2016-03-16 NOTE — Telephone Encounter (Signed)
I called Mrs Flammia and she didn't answer the phone. I discussed some options briefly. My preference would be to start a fentanyl patch 81mg. #10. I instructed her to call back to the office. If she would like to do this, I would ask that EZella Ballwrite her an RX for this. Thanks

## 2016-03-19 ENCOUNTER — Telehealth: Payer: Self-pay | Admitting: *Deleted

## 2016-03-19 NOTE — Telephone Encounter (Signed)
Oncology Nurse Navigator Documentation  Oncology Nurse Navigator Flowsheets 03/19/2016  Navigator Location [facility]  Navigator Encounter Type Telephone/I noticed that Jenna Molina was not scheduled for her chemotherapy.  I called Jenna Molina to get on update on her wishes regarding treatment.  I was unable to reach but left a vm message to call with my name and phone number   Telephone Outgoing Call  Treatment Phase Pre-Tx/Tx Discussion  Barriers/Navigation Needs Coordination of Care  Interventions Coordination of Care  Coordination of Care Appts  Acuity Level 1  Time Spent with Patient 15

## 2016-03-20 ENCOUNTER — Other Ambulatory Visit: Payer: Self-pay | Admitting: Radiation Oncology

## 2016-03-20 ENCOUNTER — Telehealth: Payer: Self-pay | Admitting: Radiation Oncology

## 2016-03-20 DIAGNOSIS — C7931 Secondary malignant neoplasm of brain: Secondary | ICD-10-CM

## 2016-03-20 MED ORDER — DEXAMETHASONE 4 MG PO TABS: 4.0000 mg | ORAL_TABLET | Freq: Three times a day (TID) | ORAL | 0 refills | Status: DC

## 2016-03-20 NOTE — Telephone Encounter (Signed)
Phoned patient making her aware decadron refill was escribed to [facility] in [place]. Also, explained frequency had been changed from qid to tid. She verbalized understanding and expressed appreciation for the return call.

## 2016-03-21 ENCOUNTER — Telehealth: Payer: Self-pay | Admitting: Radiation Oncology

## 2016-03-21 ENCOUNTER — Ambulatory Visit
Admission: RE | Admit: 2016-03-21 | Discharge: 2016-03-21 | Disposition: A | Discharge status: Home / Self Care | Payer: Medicare Other | Source: Ambulatory Visit | Attending: Radiation Oncology | Admitting: Radiation Oncology

## 2016-03-21 DIAGNOSIS — C7931 Secondary malignant neoplasm of brain: Secondary | ICD-10-CM | POA: Diagnosis present

## 2016-03-21 DIAGNOSIS — C3412 Malignant neoplasm of upper lobe, left bronchus or lung: Secondary | ICD-10-CM | POA: Diagnosis present

## 2016-03-21 DIAGNOSIS — Z510 Encounter for antineoplastic radiation therapy: Secondary | ICD-10-CM | POA: Diagnosis not present

## 2016-03-21 NOTE — Progress Notes (Signed)
  Radiation Oncology         [phone] ________________________________  Name: Jack Mineau MRN: [id number]  Date: 03/21/2016  DOB: [birth year]  SIMULATION AND TREATMENT PLANNING NOTE    ICD-9-CM ICD-10-CM   1. Brain metastases ([place]) 198.3 C79.31     DIAGNOSIS:  60 year-old woman with numerous brain metastases from Stage IV (T2a, N1, M1b) non-small cell lung cancer of the left upper lobe  NARRATIVE:  The patient was brought to the [facility].  Identity was confirmed.  All relevant records and images related to the planned course of therapy were reviewed.  The patient freely provided informed written consent to proceed with treatment after reviewing the details related to the planned course of therapy. The consent form was witnessed and verified by the simulation staff.  Then, the patient was set-up in a stable reproducible  supine position for radiation therapy.  CT images were obtained.  Surface markings were placed.  The CT images were loaded into the planning software.  Then the target and avoidance structures were contoured.  Treatment planning then occurred.  The radiation prescription was entered and confirmed.  Then, I designed and supervised the construction of a total of 3 medically necessary complex treatment devices, including a custom made thermoplastic mask used for immobilization and two complex multileaf collimators to cover the entire intracranial contents, while shielding the eyes and face.  Each [facility] is independently created to account for beam divergence.  The right and left lateral fields will be treated with 6 MV X-rays.  I have requested : Isodose Plan.    PLAN:  The whole brain will be treated to 30 Gy in 10 fractions.  ________________________________  Sheral Apley Tammi Klippel, M.D.  This document serves as a record of services personally performed by [name], MD. It was created on his behalf by Arlyce Harman, a trained medical scribe. The creation  of this record is based on the scribe's personal observations and the provider's statements to them. This document has been checked and approved by the attending provider.

## 2016-03-21 NOTE — Telephone Encounter (Signed)
LM for the patient to let her know her dosing of radiation which she requested.

## 2016-03-23 ENCOUNTER — Telehealth: Payer: Self-pay | Admitting: Radiation Oncology

## 2016-03-23 DIAGNOSIS — Z510 Encounter for antineoplastic radiation therapy: Secondary | ICD-10-CM | POA: Diagnosis not present

## 2016-03-23 NOTE — Telephone Encounter (Signed)
Phoned patient back. Explained the area by her belly button will have no effect on the treatment plan for Wednesday. Explained that per [name] she should present on Wednesday for treatment then, come around to nursing for further evaluation of the area of concern. She verbalized understanding and expressed appreciation for the return call.

## 2016-03-23 NOTE — Telephone Encounter (Signed)
Returned message left by patient. Patient insistent that she must speak with [name]. Explained [name] is out of the office. Assured patient this RN would relay her message. Patient states, "there is something by my bellybutton that is cause for alarm." She goes on to explain the area of above and to the left of her belly button approximately the size of a walnut. She explains it has been present for 4 days. She denies nausea, vomiting, diarrhea or constipation. She denies abdominal pain. She reports taking decadron 4 mg tid, miralax for IBS with constipation, and opioids to manage her fibromyalgia. Patient request a return call from [name] today so "she doesn't worry all weekend."

## 2016-03-26 NOTE — Telephone Encounter (Signed)
Opened in error

## 2016-03-28 ENCOUNTER — Ambulatory Visit
Admission: RE | Admit: 2016-03-28 | Discharge: 2016-03-28 | Disposition: A | Discharge status: Home / Self Care | Payer: Medicare Other | Source: Ambulatory Visit | Attending: Radiation Oncology | Admitting: Radiation Oncology

## 2016-03-28 DIAGNOSIS — Z510 Encounter for antineoplastic radiation therapy: Secondary | ICD-10-CM | POA: Diagnosis not present

## 2016-03-29 ENCOUNTER — Ambulatory Visit
Admission: RE | Admit: 2016-03-29 | Discharge: 2016-03-29 | Disposition: A | Discharge status: Home / Self Care | Payer: Medicare Other | Source: Ambulatory Visit | Attending: Radiation Oncology | Admitting: Radiation Oncology

## 2016-03-29 DIAGNOSIS — Z510 Encounter for antineoplastic radiation therapy: Secondary | ICD-10-CM | POA: Diagnosis not present

## 2016-03-30 ENCOUNTER — Ambulatory Visit
Admission: RE | Admit: 2016-03-30 | Discharge: 2016-03-30 | Disposition: A | Discharge status: Home / Self Care | Payer: Medicare Other | Source: Ambulatory Visit | Attending: Radiation Oncology | Admitting: Radiation Oncology

## 2016-03-30 ENCOUNTER — Telehealth: Payer: Self-pay | Admitting: Radiation Oncology

## 2016-03-30 VITALS — BP 106/76 | Resp 16 | Wt 126.0 lb

## 2016-03-30 DIAGNOSIS — C7931 Secondary malignant neoplasm of brain: Secondary | ICD-10-CM

## 2016-03-30 DIAGNOSIS — Z510 Encounter for antineoplastic radiation therapy: Secondary | ICD-10-CM | POA: Diagnosis not present

## 2016-03-30 DIAGNOSIS — R110 Nausea: Secondary | ICD-10-CM

## 2016-03-30 MED ORDER — ONDANSETRON HCL 8 MG PO TABS: 8.0000 mg | ORAL_TABLET | Freq: Three times a day (TID) | ORAL | 3 refills | Status: AC | PRN

## 2016-03-30 NOTE — Progress Notes (Signed)
Weight and vitals stable. Denies pain. Reports taking decadron 4 mg bid. Patient has been prescribed to take decadron tid but, "can't seem to get that last pill in."  Reports while taking decadron her cognitive skills are improved and she is able to use her right hand. No evidence of thrush noted. Reports intermittent headaches. Reports nausea worse in the morning and after meal. Reports that she has phenergan to take for nausea but, it doesn't seem to help.  Denies ringing in her ears. Denies diplopia but, reports "my eyes feel weird."  BP 106/76 (BP Location: Right Arm, Patient Position: Sitting, Cuff Size: Normal)   Resp 16   Wt 126 lb (57.2 kg)   SpO2 100%   BMI 21.63 kg/m  Wt Readings from Last 3 Encounters:  03/30/16 126 lb (57.2 kg)  03/15/16 126 lb 11.2 oz (57.5 kg)  03/01/16 122 lb (55.3 kg)

## 2016-03-30 NOTE — Progress Notes (Signed)
  Radiation Oncology         [phone]   Name: Jenna Molina MRN: [id number]   Date: 03/30/2016  DOB: 05/27/1955   Weekly Radiation Therapy Management    ICD-9-CM ICD-10-CM   1. Nausea 787.02 R11.0 ondansetron (ZOFRAN) 8 MG tablet  2. Brain metastases (HCC) 198.3 C79.31     Current Dose: 9 Gy  Planned Dose:  30 Gy  Narrative The patient presents for routine under treatment assessment.  Denies pain. Reports taking decadron 4 mg BID. Patient has been prescribed to take decadron TID, but "can't seem to get that last pill in."  Reports while taking decadron her cognitive skills are improved and she is able to use her right hand. No evidence of thrush noted by the nurse. Reports intermittent headaches. Reports nausea worse in the morning and after meals. Reports that she has phenergan to take for nausea, but it doesn't seem to help.  Denies ringing in her ears. Denies diplopia, but reports "my eyes feel weird."  Set-up films were reviewed. The chart was checked.  Physical Findings  weight is 126 lb (57.2 kg). Her blood pressure is 106/76. Her respiration is 16 and oxygen saturation is 100%. . Weight essentially stable.  No significant changes.  Impression The patient is tolerating radiation.  Plan Continue treatment as planned. Zofran has been prescribed for the patient's nausea.         Sheral Apley Tammi Klippel, M.D.  This document serves as a record of services personally performed by [name], MD. It was created on his behalf by Darcus Austin, a trained medical scribe. The creation of this record is based on the scribe's personal observations and the provider's statements to them. This document has been checked and approved by the attending provider.

## 2016-03-30 NOTE — Telephone Encounter (Signed)
Patient left message requesting return call about painless edema of gradual onset at jawline. Returned call. No answer. With patient permission left detailed message that edema is most likely a side effect of the decadron. Explained our staff could assess further in person on Monday. Encouraged patient to call our on call provider should the edema become worse or painful over the weekend.

## 2016-04-02 ENCOUNTER — Ambulatory Visit: Payer: Medicare Other

## 2016-04-03 ENCOUNTER — Ambulatory Visit
Admission: RE | Admit: 2016-04-03 | Discharge: 2016-04-03 | Disposition: A | Discharge status: Home / Self Care | Payer: Medicare Other | Source: Ambulatory Visit | Attending: Radiation Oncology | Admitting: Radiation Oncology

## 2016-04-03 DIAGNOSIS — C7931 Secondary malignant neoplasm of brain: Secondary | ICD-10-CM

## 2016-04-03 DIAGNOSIS — Z510 Encounter for antineoplastic radiation therapy: Secondary | ICD-10-CM | POA: Diagnosis not present

## 2016-04-03 MED ORDER — BIAFINE EX EMUL
Freq: Two times a day (BID) | CUTANEOUS | Status: DC
  Administered 2016-04-03: 11:00:00 via TOPICAL

## 2016-04-04 ENCOUNTER — Ambulatory Visit
Admission: RE | Admit: 2016-04-04 | Discharge: 2016-04-04 | Disposition: A | Discharge status: Home / Self Care | Payer: Medicare Other | Source: Ambulatory Visit | Attending: Radiation Oncology | Admitting: Radiation Oncology

## 2016-04-04 DIAGNOSIS — Z510 Encounter for antineoplastic radiation therapy: Secondary | ICD-10-CM | POA: Diagnosis not present

## 2016-04-05 ENCOUNTER — Ambulatory Visit
Admission: RE | Admit: 2016-04-05 | Discharge: 2016-04-05 | Disposition: A | Discharge status: Home / Self Care | Payer: Medicare Other | Source: Ambulatory Visit | Attending: Radiation Oncology | Admitting: Radiation Oncology

## 2016-04-05 DIAGNOSIS — Z510 Encounter for antineoplastic radiation therapy: Secondary | ICD-10-CM | POA: Diagnosis not present

## 2016-04-06 ENCOUNTER — Ambulatory Visit
Admission: RE | Admit: 2016-04-06 | Discharge: 2016-04-06 | Disposition: A | Discharge status: Home / Self Care | Payer: Medicare Other | Source: Ambulatory Visit | Attending: Radiation Oncology | Admitting: Radiation Oncology

## 2016-04-06 ENCOUNTER — Encounter: Payer: Self-pay | Admitting: Radiation Oncology

## 2016-04-06 VITALS — BP 97/58 | HR 75 | Resp 18 | Wt 128.0 lb

## 2016-04-06 DIAGNOSIS — C7931 Secondary malignant neoplasm of brain: Secondary | ICD-10-CM

## 2016-04-06 DIAGNOSIS — Z510 Encounter for antineoplastic radiation therapy: Secondary | ICD-10-CM | POA: Diagnosis not present

## 2016-04-06 NOTE — Progress Notes (Signed)
  Radiation Oncology         [phone]   Name: Jenna Molina MRN: [id number]   Date: 04/06/2016  DOB: [birth year]   Weekly Radiation Therapy Management    ICD-9-CM ICD-10-CM   1. Brain metastases (HCC) 198.3 C79.31     Current Dose: 21 Gy  Planned Dose:  30 Gy  Narrative The patient presents for routine under treatment assessment.  Weight and vitals stable. Denies pain. Reports taking decadron 4 mg once a day. Reports she is prescribed to take it more often but, doesn't because she is sleeping more. Per nursing, no evidence of thrush noted. Reports continued intermittent intense headaches. Reports continued nausea and vomiting. Confirms antiemetics prescribed have lessen the frequency and intensity of the N/V. Denies ringing in the ears or diplopia.   Set-up films were reviewed. The chart was checked.  Physical Findings  weight is 128 lb (58.1 kg). Her blood pressure is 97/58 (abnormal) and her pulse is 75. Her respiration is 18 and oxygen saturation is 100%. . Weight essentially stable.  No significant changes.  Impression The patient is tolerating radiation.  Plan Continue treatment as planned. I instructed the patient to begin reducing her Decadron dose at the first of the year; she should take 2 mg instead of 4 mg. We will continue to taper her dose at our next appointment.         Sheral Apley Tammi Klippel, M.D.  This document serves as a record of services personally performed by [name], MD. It was created on his behalf by Maryla Morrow, a trained medical scribe. The creation of this record is based on the scribe's personal observations and the provider's statements to them. This document has been checked and approved by the attending provider.

## 2016-04-06 NOTE — Progress Notes (Signed)
Weight and vitals stable. Denies pain. Reports taking decadron 4 mg once a day. Reports she is prescribed to take it more often but, doesn't because she is sleeping more. No evidence of thrush noted. Reports continued intermittent intense headaches. Reports continued nausea and vomiting. Confirms antiemetics prescribed have lessen the frequency and intensity of the N/V. Denies ringing in the ears or diplopia.   BP (!) 97/58 (BP Location: Left Arm, Patient Position: Sitting, Cuff Size: Normal)   Pulse 75   Resp 18   Wt 128 lb (58.1 kg)   SpO2 100%   BMI 21.97 kg/m  Wt Readings from Last 3 Encounters:  04/06/16 128 lb (58.1 kg)  03/30/16 126 lb (57.2 kg)  03/15/16 126 lb 11.2 oz (57.5 kg)

## 2016-04-10 ENCOUNTER — Ambulatory Visit: Payer: Medicare Other

## 2016-04-11 ENCOUNTER — Ambulatory Visit: Payer: Medicare Other

## 2016-04-11 NOTE — Addendum Note (Signed)
Encounter addended by: Heywood Footman, RN on: 04/11/2016  9:53 AM<BR>    Actions taken: Chief Complaint modified, Home Medications modified, Patient Education assessment filed, Order Reconciliation Section accessed

## 2016-04-12 ENCOUNTER — Encounter: Payer: Self-pay | Admitting: Registered Nurse

## 2016-04-12 ENCOUNTER — Ambulatory Visit
Admission: RE | Admit: 2016-04-12 | Discharge: 2016-04-12 | Disposition: A | Discharge status: Home / Self Care | Payer: Medicare Other | Source: Ambulatory Visit | Attending: Radiation Oncology | Admitting: Radiation Oncology

## 2016-04-12 DIAGNOSIS — Z510 Encounter for antineoplastic radiation therapy: Secondary | ICD-10-CM | POA: Diagnosis not present

## 2016-04-12 DIAGNOSIS — C7931 Secondary malignant neoplasm of brain: Secondary | ICD-10-CM

## 2016-04-12 MED ORDER — BIAFINE EX EMUL
Freq: Every day | CUTANEOUS | Status: DC
  Administered 2016-04-12: 10:00:00 via TOPICAL

## 2016-04-13 ENCOUNTER — Other Ambulatory Visit: Payer: Self-pay | Admitting: *Deleted

## 2016-04-13 ENCOUNTER — Ambulatory Visit: Payer: Medicare Other

## 2016-04-13 ENCOUNTER — Inpatient Hospital Stay: Admission: RE | Admit: 2016-04-13 | Payer: Self-pay | Source: Ambulatory Visit | Admitting: Radiation Oncology

## 2016-04-13 MED ORDER — FENTANYL 25 MCG/HR TD PT72: 25.0000 ug | MEDICATED_PATCH | TRANSDERMAL | 0 refills | Status: DC

## 2016-04-13 NOTE — Telephone Encounter (Signed)
Per email I have refilled her fentanyl patch 22mg #10 and asked EZella Ballto sign for Dr SNaaman Plummer  Bonniejean notified.

## 2016-04-17 ENCOUNTER — Telehealth: Payer: Self-pay | Admitting: Radiation Oncology

## 2016-04-17 ENCOUNTER — Ambulatory Visit: Payer: Medicare Other

## 2016-04-17 ENCOUNTER — Other Ambulatory Visit: Payer: Self-pay | Admitting: Radiation Oncology

## 2016-04-17 DIAGNOSIS — N761 Subacute and chronic vaginitis: Secondary | ICD-10-CM

## 2016-04-17 MED ORDER — FLUCONAZOLE 150 MG PO TABS
150.0000 mg | ORAL_TABLET | Freq: Every day | ORAL | 0 refills | Status: DC
Start: 2016-04-17 — End: 2016-04-25

## 2016-04-17 NOTE — Telephone Encounter (Signed)
Second attempt to reach patient to discuss prescription and treatment plan. No answer. Left message requesting return call.

## 2016-04-17 NOTE — Telephone Encounter (Signed)
Patient returned my voicemail. Explained Diflucan had been escribed to her [place] on [address]. Instructed patient to take 1/2 tablet instead of whole tablet of Citalopram while taking Diflucan. Patient verbalized understanding. Expressed my understanding that patient wished to stopped radiation short of completion. Patient states, "my hair loss and redness on my forehead is really bothering me." Patient confirms use of Biafine cream BID. Explained that her cancer that spread to the brain has not been completely treated, so, the treatment may not be effective without finishing. Patient verbalized understanding and agreed to complete final two treatments. Patient committed to present at [phone] tomorrow to resume xrt. Informed Dr. Tammi Klippel, [name], and [facility] staff of this finding.

## 2016-04-17 NOTE — Telephone Encounter (Signed)
Phoned patient to discuss prescription and treatment plan. No answer. Left message requesting return call.

## 2016-04-18 ENCOUNTER — Ambulatory Visit
Admission: RE | Admit: 2016-04-18 | Discharge: 2016-04-18 | Disposition: A | Discharge status: Home / Self Care | Payer: Medicare Other | Source: Ambulatory Visit | Attending: Radiation Oncology | Admitting: Radiation Oncology

## 2016-04-18 ENCOUNTER — Ambulatory Visit: Payer: Medicare Other

## 2016-04-18 ENCOUNTER — Ambulatory Visit: Payer: Medicare Other | Admitting: Radiation Oncology

## 2016-04-18 DIAGNOSIS — Z510 Encounter for antineoplastic radiation therapy: Secondary | ICD-10-CM | POA: Diagnosis not present

## 2016-04-18 DIAGNOSIS — C7931 Secondary malignant neoplasm of brain: Secondary | ICD-10-CM | POA: Diagnosis not present

## 2016-04-18 DIAGNOSIS — C3412 Malignant neoplasm of upper lobe, left bronchus or lung: Secondary | ICD-10-CM | POA: Diagnosis not present

## 2016-04-19 ENCOUNTER — Ambulatory Visit
Admission: RE | Admit: 2016-04-19 | Discharge: 2016-04-19 | Disposition: A | Discharge status: Home / Self Care | Payer: Medicare Other | Source: Ambulatory Visit | Attending: Radiation Oncology | Admitting: Radiation Oncology

## 2016-04-19 ENCOUNTER — Encounter: Payer: Self-pay | Admitting: Radiation Oncology

## 2016-04-19 ENCOUNTER — Ambulatory Visit: Payer: Medicare Other

## 2016-04-19 VITALS — BP 101/76 | HR 73 | Temp 97.9°F | Resp 10 | Wt 132.0 lb

## 2016-04-19 DIAGNOSIS — C3412 Malignant neoplasm of upper lobe, left bronchus or lung: Secondary | ICD-10-CM | POA: Diagnosis not present

## 2016-04-19 DIAGNOSIS — C7931 Secondary malignant neoplasm of brain: Secondary | ICD-10-CM

## 2016-04-19 DIAGNOSIS — Z510 Encounter for antineoplastic radiation therapy: Secondary | ICD-10-CM | POA: Diagnosis not present

## 2016-04-19 NOTE — Progress Notes (Signed)
  Radiation Oncology         [phone]   Name: Jenna Molina MRN: [id number]   Date: 04/19/2016  DOB: Jun 26, 1955   Weekly Radiation Therapy Management    ICD-9-CM ICD-10-CM   1. Brain metastases (HCC) 198.3 C79.31     Current Dose: 30 Gy  Planned Dose:  30 Gy  Narrative The patient presents for routine under treatment assessment.  Weight and vitals stable. Patient reports 7/10 stabbing and aching left posterior medial back pain. Patient is alert and oriented x 3 with fluent speech, gait normal, reflexes normal and symmetric. Reports weakness to bilateral legs. Patient denies visual blurring bilateral eyes. She is positive for ringing in left ear. Patient presents appropriate quality, quantity, and organization of sentences. Patient reports dull pain unilateral in the left frontal area, and unilateral in the left temporal area. Per nursing, erythema notes in the forehead. Patient is using hydrocortisone and Aveeno. Patient also complains of fatigue and weakness.  Set-up films were reviewed. The chart was checked.  Physical Findings  weight is 132 lb (59.9 kg). Her oral temperature is 97.9 F (36.6 C). Her blood pressure is 101/76 and her pulse is 73. Her respiration is 10 and oxygen saturation is 97%. . Weight essentially stable.  No significant changes.  Impression The patient has tolerated radiation.  Plan  One month follow up card was given. Brain MRI will follow the visit. I instructed the patient to remain on Decadron 2 mg daily for the next 2 weeks.         Sheral Apley Tammi Klippel, M.D.  This document serves as a record of services personally performed by [name], MD. It was created on his behalf by Bethann Humble, a trained medical scribe. The creation of this record is based on the scribe's personal observations and the provider's statements to them. This document has been checked and approved by the attending provider.

## 2016-04-19 NOTE — Progress Notes (Signed)
PAIN: She rates her pain as a 7 on a scale of 0-10. constant, stabbing and aching over left posterior medial back. NEURO: Pt alert & oriented x 3 with fluent speech, gait normal, reflexes normal and symmetric. Reports weakness to bilateral legs. Pt reports negative for visual blurring bilateral eyes, PERRLA, ringing in left ear. Pt presenting appropriate quality, quantity and organization of sentences. Pt reports dull pain, sharp pain, unilateral in the left frontal area, unilateral in the left temporal area. SKIN: Noted erythema to forehead, Pruritus. Using hydrocortisone and Aveeno.  OTHER: Pt complains of fatigue and weakness. Decadron? Yes.   Mouth is moist, clean.   BP 101/76   Pulse 73   Temp 97.9 F (36.6 C) (Oral)   Resp 10   Wt 132 lb (59.9 kg)   SpO2 97%   BMI 22.66 kg/m  Wt Readings from Last 3 Encounters:  04/19/16 132 lb (59.9 kg)  04/06/16 128 lb (58.1 kg)  03/30/16 126 lb (57.2 kg)   EOT!

## 2016-04-20 ENCOUNTER — Telehealth: Payer: Self-pay | Admitting: *Deleted

## 2016-04-20 ENCOUNTER — Ambulatory Visit: Payer: Medicare Other

## 2016-04-20 ENCOUNTER — Telehealth: Payer: Self-pay | Admitting: Internal Medicine

## 2016-04-20 ENCOUNTER — Telehealth: Payer: Self-pay | Admitting: Physical Medicine & Rehabilitation

## 2016-04-20 ENCOUNTER — Encounter: Payer: Self-pay | Admitting: Radiation Oncology

## 2016-04-20 NOTE — Telephone Encounter (Signed)
Oncology Nurse Navigator Documentation  Oncology Nurse Navigator Flowsheets 04/20/2016  Navigator Location [facility]  Navigator Encounter Type Telephone/Jenna Molina is schedule to see Dr. [name] next week.  I called to check on her.  I left my name and phone number to call if needed.   Telephone Outgoing Call  Treatment Phase Other  Barriers/Navigation Needs Education  Acuity Level 1  Time Spent with Patient 15

## 2016-04-20 NOTE — Telephone Encounter (Signed)
Jenna Molina needed [name] DEA as supervising provider for Jenna Ball NP. Given.

## 2016-04-20 NOTE — Progress Notes (Signed)
  Radiation Oncology         [phone] ________________________________  Name: Jenna Molina MRN: [id number]  Date: 04/20/2016  DOB: 06/13/1955  End of Treatment Note  Diagnosis:   Brain metastases from Stage IV non-small cell lung cancer of the upper left lobe     Indication for treatment:  Palliative       Radiation treatment dates:   03/28/16 - 04/19/16  Site/dose:   Brain treated to 30 Gy in 10 fractions of 3 Gy  Beams/energy:   6X  //  Isodose Plan  Narrative: The patient tolerated radiation treatment relatively well. During treatment the patient experienced bilateral leg weakness, as well as dull pain in the left frontal area and more severe pain to the left posterior medial back. She experienced fatigue towards the end of treatment.  Plan: The patient has completed radiation treatment. The patient will return to radiation oncology clinic for routine followup in one month. I advised her to call or return sooner if she has any questions or concerns related to her recovery or treatment. ________________________________  Sheral Apley. Tammi Klippel, M.D.  This document serves as a record of services personally performed by [name], MD. It was created on his behalf by Maryla Morrow, a trained medical scribe. The creation of this record is based on the scribe's personal observations and the provider's statements to them. This document has been checked and approved by the attending provider.

## 2016-04-20 NOTE — Telephone Encounter (Signed)
[place] at [employer] (not on file here)  [phone] phoned office has a question

## 2016-04-20 NOTE — Telephone Encounter (Signed)
lvm to inform pt of 1/10 appt date/time per LOS

## 2016-04-24 ENCOUNTER — Other Ambulatory Visit: Payer: Self-pay | Admitting: Medical Oncology

## 2016-04-24 ENCOUNTER — Ambulatory Visit: Payer: Medicare Other

## 2016-04-24 DIAGNOSIS — C3492 Malignant neoplasm of unspecified part of left bronchus or lung: Secondary | ICD-10-CM

## 2016-04-25 ENCOUNTER — Encounter: Payer: Self-pay | Admitting: Internal Medicine

## 2016-04-25 ENCOUNTER — Other Ambulatory Visit (HOSPITAL_BASED_OUTPATIENT_CLINIC_OR_DEPARTMENT_OTHER): Payer: Medicare HMO

## 2016-04-25 ENCOUNTER — Ambulatory Visit (HOSPITAL_BASED_OUTPATIENT_CLINIC_OR_DEPARTMENT_OTHER): Payer: Medicare HMO | Admitting: Internal Medicine

## 2016-04-25 ENCOUNTER — Telehealth: Payer: Self-pay | Admitting: Internal Medicine

## 2016-04-25 VITALS — BP 110/66 | HR 76 | Temp 98.6°F | Resp 18 | Ht 64.0 in | Wt 131.5 lb

## 2016-04-25 DIAGNOSIS — C3492 Malignant neoplasm of unspecified part of left bronchus or lung: Secondary | ICD-10-CM

## 2016-04-25 DIAGNOSIS — C3412 Malignant neoplasm of upper lobe, left bronchus or lung: Secondary | ICD-10-CM | POA: Diagnosis not present

## 2016-04-25 DIAGNOSIS — E039 Hypothyroidism, unspecified: Secondary | ICD-10-CM | POA: Diagnosis not present

## 2016-04-25 DIAGNOSIS — R531 Weakness: Secondary | ICD-10-CM

## 2016-04-25 DIAGNOSIS — R5383 Other fatigue: Secondary | ICD-10-CM | POA: Diagnosis not present

## 2016-04-25 DIAGNOSIS — C7931 Secondary malignant neoplasm of brain: Secondary | ICD-10-CM

## 2016-04-25 DIAGNOSIS — M797 Fibromyalgia: Secondary | ICD-10-CM

## 2016-04-25 DIAGNOSIS — Z5111 Encounter for antineoplastic chemotherapy: Secondary | ICD-10-CM | POA: Insufficient documentation

## 2016-04-25 DIAGNOSIS — Z7189 Other specified counseling: Secondary | ICD-10-CM | POA: Insufficient documentation

## 2016-04-25 HISTORY — DX: Encounter for antineoplastic chemotherapy: Z51.11

## 2016-04-25 HISTORY — DX: Other specified counseling: Z71.89

## 2016-04-25 LAB — CBC WITH DIFFERENTIAL/PLATELET
BASO%: 0.5 % (ref 0.0–2.0)
BASOS ABS: 0 10*3/uL (ref 0.0–0.1)
EOS ABS: 0.1 10*3/uL (ref 0.0–0.5)
EOS%: 1.3 % (ref 0.0–7.0)
HEMATOCRIT: 38 % (ref 34.8–46.6)
HEMOGLOBIN: 12.5 g/dL (ref 11.6–15.9)
LYMPH%: 15.2 % (ref 14.0–49.7)
MCH: 28.5 pg (ref 25.1–34.0)
MCHC: 32.8 g/dL (ref 31.5–36.0)
MCV: 86.8 fL (ref 79.5–101.0)
MONO#: 0.8 10*3/uL (ref 0.1–0.9)
MONO%: 8.3 % (ref 0.0–14.0)
NEUT%: 74.7 % (ref 38.4–76.8)
NEUTROS ABS: 6.9 10*3/uL — AB (ref 1.5–6.5)
PLATELETS: 312 10*3/uL (ref 145–400)
RBC: 4.37 10*6/uL (ref 3.70–5.45)
RDW: 15.3 % — AB (ref 11.2–14.5)
WBC: 9.3 10*3/uL (ref 3.9–10.3)
lymph#: 1.4 10*3/uL (ref 0.9–3.3)

## 2016-04-25 LAB — COMPREHENSIVE METABOLIC PANEL
ALBUMIN: 3.7 g/dL (ref 3.5–5.0)
ALK PHOS: 111 U/L (ref 40–150)
ALT: 21 U/L (ref 0–55)
ANION GAP: 9 meq/L (ref 3–11)
AST: 14 U/L (ref 5–34)
BUN: 18.3 mg/dL (ref 7.0–26.0)
CALCIUM: 9.5 mg/dL (ref 8.4–10.4)
CO2: 28 mEq/L (ref 22–29)
Chloride: 104 mEq/L (ref 98–109)
Creatinine: 1 mg/dL (ref 0.6–1.1)
EGFR: 61 mL/min/{1.73_m2} — AB (ref >90)
Glucose: 115 mg/dl (ref 70–140)
Potassium: 3.9 mEq/L (ref 3.5–5.1)
Sodium: 140 mEq/L (ref 136–145)
TOTAL PROTEIN: 6.5 g/dL (ref 6.4–8.3)

## 2016-04-25 MED ORDER — DEXAMETHASONE 4 MG PO TABS: ORAL_TABLET | ORAL | 1 refills | Status: DC

## 2016-04-25 MED ORDER — FOLIC ACID 1 MG PO TABS: 1.0000 mg | ORAL_TABLET | Freq: Every day | ORAL | 4 refills | Status: DC

## 2016-04-25 MED ORDER — PROCHLORPERAZINE MALEATE 10 MG PO TABS: 10.0000 mg | ORAL_TABLET | Freq: Four times a day (QID) | ORAL | 0 refills | Status: DC | PRN

## 2016-04-25 MED ORDER — CYANOCOBALAMIN 1000 MCG/ML IJ SOLN
1000.0000 ug | Freq: Once | INTRAMUSCULAR | Status: AC
  Administered 2016-04-25: 1000 ug via INTRAMUSCULAR

## 2016-04-25 NOTE — Progress Notes (Signed)
[facility] [phone]   [phone]  OFFICE PROGRESS NOTE  Tawanna Solo, MD [place] [address]  DIAGNOSIS: Stage IV (T2a, N1, M1 B) non-small cell lung cancer, adenocarcinoma presented with left upper lobe lung mass, left hilar adenopathy and multiple brain metastasis as well as metastatic bone disease diagnosed in November 2017. PDL1 expression is 20%.  Genomic Alterations Identified? ERBB2 amplification - equivocal? STK11 K146* KRAS G12D KEAP1 Q359* LYN amplification MCL1 amplification - equivocal? Additional Findings? Microsatellite status MS-Stable Tumor Mutation Burden TMB-Intermediate; 15 Muts/Mb Additional Disease-relevant Genes with No Reportable Alterations Identified? EGFR ALK BRAF MET RET ROS1  PRIOR THERAPY: Whole brain irradiation to multiple metastatic brain lesions under the care of Dr. Tammi Klippel.  CURRENT THERAPY: Systemic chemotherapy with carboplatin for AUC of 5, Alimta 500 MG/M2 and Avastin 15 MG/KG every 3 weeks. First dose 05/01/2016.  INTERVAL HISTORY: Jenna Molina 61 y.o. female returns to the clinic today for follow-up visit accompanied by her husband. The patient recently completed a course of whole brain irradiation to the multiple metastatic brain lesion. She tolerated the treatment well with no significant adverse effects except for the alopecia and dryness and itching in both ears. She also complaining of increasing fatigue and weakness. She denied having any chest pain, shortness of breath, cough or hemoptysis. She has no fever or chills. She denied having any significant nausea, vomiting, diarrhea or constipation. She has no significant weight loss or night sweats. The patient is here today for evaluation and discussion of her treatment options.  MEDICAL HISTORY: Past Medical History:  Diagnosis Date  . Adenocarcinoma of left lung, stage 4 ([place]) 03/01/2016  . Anxiety   . Cancer  (HCC)    Cervical cancer, lung cancer   . Chronic bronchitis ([place])   . Depression   . Fibromyalgia   . GERD (gastroesophageal reflux disease)   . Heart murmur    Mitral Valve prolapse  . Hypothyroidism   . Irritable bowel    constipation  . Migraines   . Neuromuscular disorder ([place])   . Pneumonia     ALLERGIES:  is allergic to aspirin; lyrica [pregabalin]; sulfa antibiotics; adhesive [tape]; and neosporin [neomycin-bacitracin zn-polymyx].  MEDICATIONS:  Current Outpatient Prescriptions  Medication Sig Dispense Refill  . Acetaminophen 500 MG coapsule Take 250-500 mg by mouth 3 (three) times daily. Takes 253m at 0030 and 0800 and 5056mat bedtime (1430)    . cholecalciferol (VITAMIN D) 1000 UNITS tablet Take 5,000 Units by mouth every morning.    . citalopram (CELEXA) 40 MG tablet Take 60 mg by mouth at bedtime.     . conjugated estrogens (PREMARIN) vaginal cream Place 1 Applicatorful vaginally 2 (two) times a week.    . cyclobenzaprine (FLEXERIL) 10 MG tablet Take 1 tablet (10 mg total) by mouth 3 (three) times daily as needed for muscle spasms. 90 tablet 4  . dexamethasone (DECADRON) 4 MG tablet Take 1 tablet (4 mg total) by mouth 3 (three) times daily. 60 tablet 0  . emollient (BIAFINE) cream Apply topically as needed.    . fentaNYL (DURAGESIC - DOSED MCG/HR) 25 MCG/HR patch Place 1 patch (25 mcg total) onto the skin every 3 (three) days. 10 patch 0  . HYDROcodone-acetaminophen (NORCO) 10-325 MG tablet Take 2 tablets by mouth every 8 (eight) hours. 180 tablet 0  . levothyroxine (SYNTHROID, LEVOTHROID) 100 MCG tablet Take 100 mcg by mouth every morning.    . Marland KitchenORazepam (ATIVAN)  1 MG tablet Take 1 mg by mouth every 6 (six) hours.    . ondansetron (ZOFRAN) 8 MG tablet Take 1 tablet (8 mg total) by mouth every 8 (eight) hours as needed for nausea or vomiting. 30 tablet 3  . Polyethyl Glycol-Propyl Glycol (SYSTANE OP) Place 2 drops into both eyes daily as needed (dryness).    .  promethazine (PHENERGAN) 25 MG tablet Take 25 mg by mouth every 6 (six) hours as needed. With Imitrex For nausea    . SUMAtriptan (IMITREX) 100 MG tablet Take 100 mg by mouth every 2 (two) hours as needed for migraine. May repeat in 2 hours if headache persists or recurs.    . topiramate (TOPAMAX) 200 MG tablet Take 200 mg by mouth every morning.    . vitamin E 400 UNIT capsule Take 400 Units by mouth every morning.    . SUMAtriptan (IMITREX) 6 MG/0.5ML SOLN injection Inject 0.5 mLs (6 mg total) into the skin every 2 (two) hours as needed for migraine or headache. May repeat in 2 hours if headache persists or recurs. 5 vial 2   No current facility-administered medications for this visit.     SURGICAL HISTORY:  Past Surgical History:  Procedure Laterality Date  . ABDOMINAL HYSTERECTOMY    . APPENDECTOMY    . CHOLECYSTECTOMY N/A 04/01/2014   Procedure: LAPAROSCOPIC CHOLECYSTECTOMY WITH INTRAOPERATIVE CHOLANGIOGRAM;  Surgeon: Jackolyn Confer, MD;  Location: [facility];  Service: General;  Laterality: N/A;  . ERCP N/A 04/02/2014   Procedure: ENDOSCOPIC RETROGRADE CHOLANGIOPANCREATOGRAPHY (ERCP);  Surgeon: Milus Banister, MD;  Location: [facility];  Service: Gastroenterology;  Laterality: N/A;  . INNER EAR SURGERY    . MASTOIDECTOMY    . MOLE REMOVAL    . RADIOLOGY WITH ANESTHESIA N/A 02/28/2016   Procedure: MRI OF THE BRAIN WITH AND WITHOUT;  Surgeon: Medication Radiologist, MD;  Location: [facility];  Service: Radiology;  Laterality: N/A;    REVIEW OF SYSTEMS:  Constitutional: positive for fatigue Eyes: negative Ears, nose, mouth, throat, and face: negative Respiratory: negative Cardiovascular: negative Gastrointestinal: negative Genitourinary:negative Integument/breast: negative Hematologic/lymphatic: negative Musculoskeletal:negative Neurological: negative Behavioral/Psych: negative Endocrine: negative Allergic/Immunologic: negative   PHYSICAL EXAMINATION: General appearance: alert,  cooperative, fatigued and no distress Head: Normocephalic, without obvious abnormality, atraumatic Neck: no adenopathy, no JVD, supple, symmetrical, trachea midline and thyroid not enlarged, symmetric, no tenderness/mass/nodules Lymph nodes: Cervical, supraclavicular, and axillary nodes normal. Resp: clear to auscultation bilaterally Back: symmetric, no curvature. ROM normal. No CVA tenderness. Cardio: regular rate and rhythm, S1, S2 normal, no murmur, click, rub or gallop GI: soft, non-tender; bowel sounds normal; no masses,  no organomegaly Extremities: extremities normal, atraumatic, no cyanosis or edema Neurologic: Alert and oriented X 3, normal strength and tone. Normal symmetric reflexes. Normal coordination and gait  ECOG PERFORMANCE STATUS: 1 - Symptomatic but completely ambulatory  Blood pressure 110/66, pulse 76, temperature 98.6 F (37 C), temperature source Oral, resp. rate 18, height 5' 4"  (1.626 m), weight 131 lb 8 oz (59.6 kg), SpO2 96 %.  LABORATORY DATA: Lab Results  Component Value Date   WBC 9.3 04/25/2016   HGB 12.5 04/25/2016   HCT 38.0 04/25/2016   MCV 86.8 04/25/2016   PLT 312 04/25/2016      Chemistry      Component Value Date/Time   NA 136 03/15/2016 0951   K 4.3 03/15/2016 0951   CL 102 01/11/2016 1037   CO2 27 03/15/2016 0951   BUN 22.4 03/15/2016 0951   CREATININE  0.9 03/15/2016 0951      Component Value Date/Time   CALCIUM 9.5 03/15/2016 0951   ALKPHOS 90 03/15/2016 0951   AST 15 03/15/2016 0951   ALT 31 03/15/2016 0951   BILITOT 0.22 03/15/2016 0951       RADIOGRAPHIC STUDIES: No results found.  ASSESSMENT AND PLAN: This is a very pleasant 61 years old white female recently diagnosed with a stage IV non-small cell lung cancer, adenocarcinoma with multiple brain metastasis status post whole brain irradiation. Her molecular studies showed no actionable mutations and PDL 1 expression was 20%. I had a lengthy discussion with the patient and  her husband today about her condition. They understand that she has incurable condition and on the treatment will be of palliative nature. I discussed with him the goals of care. She was given the option of palliative care versus palliative systemic chemotherapy with carboplatin for AUC of 5, Alimta 500 MG/M2 and Avastin 15 MG/KG every 3 weeks. The patient is interested in proceeding with the systemic chemotherapy. I discussed with the patient adverse effects of this treatment including but not limited to alopecia, myelosuppression, nausea and vomiting, peripheral neuropathy, liver or renal dysfunction in addition to the adverse effect of Avastin including GI perforation, pulmonary hemorrhage as well as wound healing delay. We will arrange for the patient to receive vitamin B12 injection today. The patient would also receive prescription for Compazine 10 mg by mouth every 6 hours as needed for nausea, Decadron 4 mg by mouth twice a day, the day before, day of and day after the chemotherapy in addition to folic acid 1 mg by mouth daily. I will arrange for the patient to have a chemotherapy education class before starting the first dose of the chemotherapy. She is expected to start the first dose of her treatment next week. The patient would come back for follow-up visit in 2 weeks for evaluation and management of any adverse effect of her treatment. For pain management the patient will continue on fentanyl patch and Norco for breakthrough pain. For hypothyroidism, she will continue on levothyroxine 100 g by mouth daily. She was advised to call immediately if she has any concerning symptoms in the interval. The patient voices understanding of current disease status and treatment options and is in agreement with the current care plan.  All questions were answered. The patient knows to call the clinic with any problems, questions or concerns. We can certainly see the patient much sooner if  necessary.  Disclaimer: This note was dictated with voice recognition software. Similar sounding words can inadvertently be transcribed and may not be corrected upon review.

## 2016-04-25 NOTE — Telephone Encounter (Signed)
Appointments scheduled per 1/10 LOS. Patient given AVS report and calendars with future scheduled appointments. °

## 2016-04-25 NOTE — Progress Notes (Signed)
START ON PATHWAY REGIMEN - Non-Small Cell Lung  [id number]: Carboplatin AUC=5 + Pemetrexed 500 mg/m2 + Bevacizumab 15 mg/kg q21 Days x 4 Cycles   A cycle is every 21 days:     Carboplatin (Paraplatin(R)) AUC=5 in 250 mL NS IV over 1 hour Dose Mod: None     Pemetrexed (Alimta(R)) 500 mg/m2 in 100 mL NS IV over 10 minutes, manufacturer recommends not administering to patients with CrCl < 45 mL/min Dose Mod: None     Bevacizumab (Avastin(R)) 15 mg/kg in 100 mL NS IV over 90 minutes first infusion, 60 minutes second infusion and 30 minutes all subsequent infusions if tolerated Dose Mod: None Additional Orders: * All AUC calculations intended to be used in [employer] formula Note: Patient to receive the following prior to the initiation of therapy: 1) Dexamethasone 4 mg orally twice daily x 6 doses.  First dose 24 hours before chemotherapy. 2) Folic acid >= 335 mcg orally daily.  First dose at least 5 days prior to the first dose of pemetrexed. 3) Vitamin B12 1,000 mcg intramuscularly every 9 weeks.  First dose at least 5 days prior to the first dose of pemetrexed.  **Always confirm dose/schedule in your pharmacy ordering system**    Patient Characteristics: Stage IV Metastatic, Non Squamous, Initial Chemotherapy/Immunotherapy, PS = 0, 1, PD-L1 Expression Positive 1-49% (TPS) / Negative / Not Tested / Not a Candidate for Immunotherapy Check here if patient was staged using an edition prior to AJCC Staging - 8th Edition (i.e., prior to April 16, 2016)? false AJCC T Category: T2a Current Disease Status: Distant Metastases AJCC N Category: N1 AJCC M Category: M1c AJCC 8 Stage Grouping: IVB Histology: Non Squamous Cell ROS1 Rearrangement Status: Negative T790M Mutation Status: Not Applicable - EGFR Mutation Negative/Unknown Other Mutations/Biomarkers: No Other Actionable Mutations PD-L1 Expression Status: PD-L1 Positive 1-49% (TPS) Chemotherapy/Immunotherapy LOT: Initial  Chemotherapy/Immunotherapy Molecular Targeted Therapy: Not Appropriate ALK Translocation Status: Negative Would you be surprised if this patient died  in the next year? I would NOT be surprised if this patient died in the next year EGFR Mutation Status: Negative/Wild Type BRAF V600E Mutation Status: Negative Performance Status: PS = 0, 1  Intent of Therapy: Non-Curative / Palliative Intent, Discussed with Patient

## 2016-04-27 ENCOUNTER — Telehealth: Payer: Self-pay | Admitting: Internal Medicine

## 2016-04-27 NOTE — Telephone Encounter (Signed)
Patient called to have 1/15 appointment rescheduled to 1/16 per she has a prior appointment at a different facility that would interfere with chemo education appointment time.

## 2016-04-30 ENCOUNTER — Encounter: Payer: Self-pay | Admitting: *Deleted

## 2016-04-30 ENCOUNTER — Encounter (HOSPITAL_COMMUNITY): Payer: Self-pay

## 2016-04-30 ENCOUNTER — Ambulatory Visit (HOSPITAL_COMMUNITY)
Admission: RE | Admit: 2016-04-30 | Discharge: 2016-04-30 | Disposition: A | Discharge status: Home / Self Care | Payer: Medicare HMO | Source: Ambulatory Visit | Attending: Internal Medicine | Admitting: Internal Medicine

## 2016-04-30 ENCOUNTER — Encounter: Payer: Self-pay | Admitting: Physical Medicine & Rehabilitation

## 2016-04-30 ENCOUNTER — Encounter: Payer: Medicare HMO | Attending: Physical Medicine & Rehabilitation | Admitting: Physical Medicine & Rehabilitation

## 2016-04-30 ENCOUNTER — Other Ambulatory Visit: Payer: Medicare HMO

## 2016-04-30 VITALS — BP 112/73 | HR 84

## 2016-04-30 DIAGNOSIS — Z7189 Other specified counseling: Secondary | ICD-10-CM | POA: Diagnosis not present

## 2016-04-30 DIAGNOSIS — G43011 Migraine without aura, intractable, with status migrainosus: Secondary | ICD-10-CM

## 2016-04-30 DIAGNOSIS — R918 Other nonspecific abnormal finding of lung field: Secondary | ICD-10-CM | POA: Diagnosis not present

## 2016-04-30 DIAGNOSIS — K760 Fatty (change of) liver, not elsewhere classified: Secondary | ICD-10-CM | POA: Diagnosis not present

## 2016-04-30 DIAGNOSIS — J90 Pleural effusion, not elsewhere classified: Secondary | ICD-10-CM | POA: Insufficient documentation

## 2016-04-30 DIAGNOSIS — Z5181 Encounter for therapeutic drug level monitoring: Secondary | ICD-10-CM | POA: Insufficient documentation

## 2016-04-30 DIAGNOSIS — M899 Disorder of bone, unspecified: Secondary | ICD-10-CM | POA: Insufficient documentation

## 2016-04-30 DIAGNOSIS — Z79899 Other long term (current) drug therapy: Secondary | ICD-10-CM | POA: Insufficient documentation

## 2016-04-30 DIAGNOSIS — C7931 Secondary malignant neoplasm of brain: Secondary | ICD-10-CM | POA: Insufficient documentation

## 2016-04-30 DIAGNOSIS — G43001 Migraine without aura, not intractable, with status migrainosus: Secondary | ICD-10-CM | POA: Diagnosis not present

## 2016-04-30 DIAGNOSIS — R590 Localized enlarged lymph nodes: Secondary | ICD-10-CM | POA: Diagnosis not present

## 2016-04-30 DIAGNOSIS — M75101 Unspecified rotator cuff tear or rupture of right shoulder, not specified as traumatic: Secondary | ICD-10-CM

## 2016-04-30 DIAGNOSIS — M791 Myalgia: Secondary | ICD-10-CM | POA: Diagnosis not present

## 2016-04-30 DIAGNOSIS — Z5111 Encounter for antineoplastic chemotherapy: Secondary | ICD-10-CM

## 2016-04-30 DIAGNOSIS — C3492 Malignant neoplasm of unspecified part of left bronchus or lung: Secondary | ICD-10-CM

## 2016-04-30 DIAGNOSIS — G894 Chronic pain syndrome: Secondary | ICD-10-CM | POA: Diagnosis not present

## 2016-04-30 DIAGNOSIS — I700 Atherosclerosis of aorta: Secondary | ICD-10-CM | POA: Diagnosis not present

## 2016-04-30 DIAGNOSIS — M797 Fibromyalgia: Secondary | ICD-10-CM

## 2016-04-30 DIAGNOSIS — M609 Myositis, unspecified: Secondary | ICD-10-CM | POA: Insufficient documentation

## 2016-04-30 MED ORDER — HYDROCODONE-ACETAMINOPHEN 10-325 MG PO TABS: 2.0000 | ORAL_TABLET | Freq: Three times a day (TID) | ORAL | 0 refills | Status: DC

## 2016-04-30 MED ORDER — IOPAMIDOL (ISOVUE-300) INJECTION 61%
100.0000 mL | Freq: Once | INTRAVENOUS | Status: AC | PRN
  Administered 2016-04-30: 100 mL via INTRAVENOUS

## 2016-04-30 MED ORDER — IOPAMIDOL (ISOVUE-300) INJECTION 61%
INTRAVENOUS | Status: AC
  Filled 2016-04-30: qty 100

## 2016-04-30 MED ORDER — TOPIRAMATE 100 MG PO TABS: 100.0000 mg | ORAL_TABLET | Freq: Two times a day (BID) | ORAL | 3 refills | Status: AC

## 2016-04-30 MED ORDER — FENTANYL 37.5 MCG/HR TD PT72: 37.5000 ug | MEDICATED_PATCH | TRANSDERMAL | 0 refills | Status: DC

## 2016-04-30 NOTE — Patient Instructions (Signed)
PLEASE FEEL FREE TO CALL OUR OFFICE WITH ANY PROBLEMS OR QUESTIONS ([phone])      

## 2016-04-30 NOTE — Progress Notes (Signed)
Subjective:    Patient ID: Jenna Molina, female    DOB: [birth year], 61 y.o.   MRN: [id number]  HPI   Jenna Molina is here in follow up of her chronic pain. She is about to start CTX per Dr. Candis Shine. Her pain is predominantly in her back/"spine" areas although she has generalized tenderness/pain everywhere. She is needing more hydrocodone than she used to need. She remains on the 26mg fentanyl patch. Her migraines are also now daily. She remains on topamax '100mg'$  daily.  She is struggling more and more with her disease and the "finality" of it. She often feels depressed and is worried how others will react when she is "not able to pull through". She is not involved in any counseling currently.   Pain Inventory Average Pain 8 Pain Right Now 2 My pain is dull and aching  In the last 24 hours, has pain interfered with the following? General activity 7 Relation with others 7 Enjoyment of life 7 What TIME of day is your pain at its worst? evening Sleep (in general) Good  Pain is worse with: walking, bending, sitting, standing and some activites Pain improves with: rest, heat/ice, pacing activities and medication Relief from Meds: 4  Mobility walk without assistance Do you have any goals in this area?  yes  Function Do you have any goals in this area?  no  Neuro/Psych No problems in this area  Prior Studies Any changes since last visit?  no  Physicians involved in your care Any changes since last visit?  no   Family History  Problem Relation Age of Onset  . Cancer Mother   . Diabetes Mother   . Anxiety disorder Mother   . Emphysema Father   . Anxiety disorder Father   . Alcohol abuse Father   . COPD Father    Social History   Social History  . Marital status: Married    Spouse name: N/A  . Number of children: N/A  . Years of education: N/A   Social History Main Topics  . Smoking status: Former Smoker    Quit date: 06/19/2008  . Smokeless tobacco: Never Used  .  Alcohol use No  . Drug use: No  . Sexual activity: Yes   Other Topics Concern  . Not on file   Social History Narrative  . No narrative on file   Past Surgical History:  Procedure Laterality Date  . ABDOMINAL HYSTERECTOMY    . APPENDECTOMY    . CHOLECYSTECTOMY N/A 04/01/2014   Procedure: LAPAROSCOPIC CHOLECYSTECTOMY WITH INTRAOPERATIVE CHOLANGIOGRAM;  Surgeon: TJackolyn Confer MD;  Location: [facility];  Service: General;  Laterality: N/A;  . ERCP N/A 04/02/2014   Procedure: ENDOSCOPIC RETROGRADE CHOLANGIOPANCREATOGRAPHY (ERCP);  Surgeon: DMilus Banister MD;  Location: [facility];  Service: Gastroenterology;  Laterality: N/A;  . INNER EAR SURGERY    . MASTOIDECTOMY    . MOLE REMOVAL    . RADIOLOGY WITH ANESTHESIA N/A 02/28/2016   Procedure: MRI OF THE BRAIN WITH AND WITHOUT;  Surgeon: Medication Radiologist, MD;  [place] [facility]  Service: Radiology;  Laterality: N/A;   Past Medical History:  Diagnosis Date  . Adenocarcinoma of left lung, stage 4 ([facility] 03/01/2016  . Anxiety   . Cancer ([facility])    Cervical cancer, lung cancer   . [place] bronchitis [place]   . Depression   . Encounter for antineoplastic chemotherapy 04/25/2016  . Fibromyalgia   . GERD (gastroesophageal reflux disease)   . Goals of care, counseling/discussion  04/25/2016  . Heart murmur    Mitral Valve prolapse  . Hypothyroidism   . Irritable bowel    constipation  . Migraines   . Neuromuscular disorder ([place])   . Pneumonia    There were no vitals taken for this visit.  Opioid Risk Score:   Fall Risk Score:  `1  Depression screen PHQ 2/9  Depression screen [facility] 2/9 03/06/2016 01/11/2016 09/20/2015 07/20/2015 05/24/2015 12/06/2014 10/12/2014  Decreased Interest 2 2 0 '1 3 3 2  '$ Down, Depressed, Hopeless 2 2 0 '1 3 3 2  '$ PHQ - 2 Score 4 4 0 '2 6 6 4  '$ Altered sleeping - - - 0 - - 2  Tired, decreased energy - - - 1 - - 2  Change in appetite - - - 2 - - 1  Feeling bad or failure about yourself  - - - 0 - - 1  Trouble  concentrating - - - 0 - - 0  Moving slowly or fidgety/restless - - - 0 - - 0  Suicidal thoughts - - - 0 - - 1  PHQ-9 Score - - - 5 - - 11   Review of Systems  Constitutional: Negative.   HENT: Negative.   Eyes: Negative.   Respiratory: Negative.   Cardiovascular: Negative.   Gastrointestinal: Negative.   Endocrine: Negative.   Genitourinary: Negative.   Musculoskeletal: Positive for back pain.  Skin: Negative.   Allergic/Immunologic: Negative.   Neurological: Negative.   Hematological: Negative.   Psychiatric/Behavioral: Negative.   All other systems reviewed and are negative.      Objective:   Physical Exam  General: Ax0 x3 HEENT:PERRL Neck:Supple without JVD or lymphadenopathy  Heart:RRR  Chest:clear  Abdomen: non distended  Extremities:No clubbing, cyanosis, or edema. Pulses are 2+  Skin:Clean and intact without signs of breakdown  Neuro:CN intact. motor 5/5. Sensory normal Musculoskeletal:some soreness along low and mid back with flexion.  Psych:pleasant but becomes tearful at times.      Assessment & Plan:  1. FMS with associated symptoms including: chronic fatigue, depression, migraine headaches, RLS, TMJ, numerous tender points and trigger points.  2. Hx of major depression  3. Hypothyroid 4. 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      Plan:  1. Refilled hydrocodone 10/325 one to 2 q8 prn, increased to  #190.                -conitnue fentanyl, increase to 37.85mg/hr             -I will be happy to help manage her pain and work with oncology as long as appropriate 2. Sq imitrex for severe, breakthrough migraine treatment.   -increase topamax to '100mg'$  BID  3. Stress/anxiety has always been a struggle for her. She is having a lot of difficulties now.  -continue Celexa at '60mg'$  daily  -have made a referral to Dr. RSima Matasto help her with adjustment/coping/grief. 4. Cancer treatment per onc/rad-onc  5. Follow up with me in  one month. 15 minutes of face to face patient care time were spent during this visit. All questions were encouraged and answered.

## 2016-05-01 ENCOUNTER — Telehealth: Payer: Self-pay | Admitting: *Deleted

## 2016-05-01 ENCOUNTER — Other Ambulatory Visit: Payer: Medicare HMO

## 2016-05-01 ENCOUNTER — Other Ambulatory Visit: Payer: Self-pay | Admitting: Medical Oncology

## 2016-05-01 DIAGNOSIS — T451X5A Adverse effect of antineoplastic and immunosuppressive drugs, initial encounter: Principal | ICD-10-CM

## 2016-05-01 DIAGNOSIS — C3490 Malignant neoplasm of unspecified part of unspecified bronchus or lung: Secondary | ICD-10-CM

## 2016-05-01 DIAGNOSIS — R112 Nausea with vomiting, unspecified: Secondary | ICD-10-CM

## 2016-05-01 MED ORDER — PROCHLORPERAZINE MALEATE 10 MG PO TABS: 10.0000 mg | ORAL_TABLET | Freq: Four times a day (QID) | ORAL | 0 refills | Status: AC | PRN

## 2016-05-01 MED ORDER — FOLIC ACID 1 MG PO TABS: 1.0000 mg | ORAL_TABLET | Freq: Every day | ORAL | 4 refills | Status: AC

## 2016-05-01 MED ORDER — DEXAMETHASONE 4 MG PO TABS: ORAL_TABLET | ORAL | 1 refills | Status: DC

## 2016-05-02 ENCOUNTER — Ambulatory Visit: Payer: Medicare HMO

## 2016-05-02 ENCOUNTER — Encounter: Payer: Self-pay | Admitting: General Practice

## 2016-05-02 ENCOUNTER — Ambulatory Visit (HOSPITAL_BASED_OUTPATIENT_CLINIC_OR_DEPARTMENT_OTHER): Payer: Medicare HMO

## 2016-05-02 ENCOUNTER — Encounter: Payer: Self-pay | Admitting: *Deleted

## 2016-05-02 ENCOUNTER — Other Ambulatory Visit (HOSPITAL_BASED_OUTPATIENT_CLINIC_OR_DEPARTMENT_OTHER): Payer: Medicare HMO

## 2016-05-02 ENCOUNTER — Encounter: Payer: Medicare HMO | Admitting: Physical Medicine & Rehabilitation

## 2016-05-02 VITALS — BP 116/65 | HR 65 | Temp 98.9°F | Resp 16

## 2016-05-02 DIAGNOSIS — Z5112 Encounter for antineoplastic immunotherapy: Secondary | ICD-10-CM | POA: Diagnosis not present

## 2016-05-02 DIAGNOSIS — Z5111 Encounter for antineoplastic chemotherapy: Secondary | ICD-10-CM

## 2016-05-02 DIAGNOSIS — Z7189 Other specified counseling: Secondary | ICD-10-CM

## 2016-05-02 DIAGNOSIS — C3492 Malignant neoplasm of unspecified part of left bronchus or lung: Secondary | ICD-10-CM

## 2016-05-02 DIAGNOSIS — C7931 Secondary malignant neoplasm of brain: Secondary | ICD-10-CM

## 2016-05-02 DIAGNOSIS — C3412 Malignant neoplasm of upper lobe, left bronchus or lung: Secondary | ICD-10-CM

## 2016-05-02 LAB — COMPREHENSIVE METABOLIC PANEL
ALBUMIN: 3.7 g/dL (ref 3.5–5.0)
ALK PHOS: 102 U/L (ref 40–150)
ALT: 16 U/L (ref 0–55)
AST: 14 U/L (ref 5–34)
Anion Gap: 8 mEq/L (ref 3–11)
BUN: 14.1 mg/dL (ref 7.0–26.0)
CO2: 23 mEq/L (ref 22–29)
Calcium: 9.3 mg/dL (ref 8.4–10.4)
Chloride: 107 mEq/L (ref 98–109)
Creatinine: 1 mg/dL (ref 0.6–1.1)
EGFR: 61 mL/min/{1.73_m2} — AB (ref >90)
GLUCOSE: 114 mg/dL (ref 70–140)
Potassium: 4.1 mEq/L (ref 3.5–5.1)
SODIUM: 138 meq/L (ref 136–145)
TOTAL PROTEIN: 6.5 g/dL (ref 6.4–8.3)

## 2016-05-02 LAB — CBC WITH DIFFERENTIAL/PLATELET
BASO%: 0.1 % (ref 0.0–2.0)
Basophils Absolute: 0 10*3/uL (ref 0.0–0.1)
EOS%: 0 % (ref 0.0–7.0)
Eosinophils Absolute: 0 10*3/uL (ref 0.0–0.5)
HCT: 35 % (ref 34.8–46.6)
HEMOGLOBIN: 11.4 g/dL — AB (ref 11.6–15.9)
LYMPH%: 10.6 % — ABNORMAL LOW (ref 14.0–49.7)
MCH: 28.4 pg (ref 25.1–34.0)
MCHC: 32.6 g/dL (ref 31.5–36.0)
MCV: 87.1 fL (ref 79.5–101.0)
MONO#: 0.6 10*3/uL (ref 0.1–0.9)
MONO%: 7.8 % (ref 0.0–14.0)
NEUT%: 81.5 % — ABNORMAL HIGH (ref 38.4–76.8)
NEUTROS ABS: 6.3 10*3/uL (ref 1.5–6.5)
PLATELETS: 323 10*3/uL (ref 145–400)
RBC: 4.02 10*6/uL (ref 3.70–5.45)
RDW: 15.2 % — AB (ref 11.2–14.5)
WBC: 7.7 10*3/uL (ref 3.9–10.3)
lymph#: 0.8 10*3/uL — ABNORMAL LOW (ref 0.9–3.3)

## 2016-05-02 LAB — UA PROTEIN, DIPSTICK - CHCC: Protein, ur: NEGATIVE mg/dL

## 2016-05-02 MED ORDER — SODIUM CHLORIDE 0.9 % IV SOLN
Freq: Once | INTRAVENOUS | Status: AC
  Administered 2016-05-02: 11:00:00 via INTRAVENOUS

## 2016-05-02 MED ORDER — SODIUM CHLORIDE 0.9 % IV SOLN
399.5000 mg | Freq: Once | INTRAVENOUS | Status: AC
  Administered 2016-05-02: 400 mg via INTRAVENOUS
  Filled 2016-05-02: qty 40

## 2016-05-02 MED ORDER — PALONOSETRON HCL INJECTION 0.25 MG/5ML
0.2500 mg | Freq: Once | INTRAVENOUS | Status: AC
  Administered 2016-05-02: 0.25 mg via INTRAVENOUS

## 2016-05-02 MED ORDER — PALONOSETRON HCL INJECTION 0.25 MG/5ML
INTRAVENOUS | Status: AC
  Filled 2016-05-02: qty 5

## 2016-05-02 MED ORDER — BEVACIZUMAB CHEMO INJECTION 400 MG/16ML
15.0000 mg/kg | Freq: Once | INTRAVENOUS | Status: AC
  Administered 2016-05-02: 900 mg via INTRAVENOUS
  Filled 2016-05-02: qty 32

## 2016-05-02 MED ORDER — PEMETREXED DISODIUM CHEMO INJECTION 500 MG
490.0000 mg/m2 | Freq: Once | INTRAVENOUS | Status: AC
  Administered 2016-05-02: 800 mg via INTRAVENOUS
  Filled 2016-05-02: qty 20

## 2016-05-02 MED ORDER — SODIUM CHLORIDE 0.9 % IV SOLN
Freq: Once | INTRAVENOUS | Status: AC
  Administered 2016-05-02: 11:00:00 via INTRAVENOUS
  Filled 2016-05-02: qty 5

## 2016-05-02 NOTE — Progress Notes (Signed)
[name] Spiritual Care Note  Referred by Alleta Cassachia/RN for spiritual support because pt is struggling with theological concerns as part of adjusting to dx and coping with px.  Met with Jenna Molina in infusion for lengthy initial conversation.  She was very welcoming of pastoral presence and reflection, engaging in deep sharing.  She is trying to understand how God fits into the suffering of facing a much earlier goodbye with her husband Jenna Molina than they ever planned (they have been married 58y, together 52).  We will continue to explore her questions, coping tools, theological reflection, and meaning-making goals together.  Rami plans to call me to set up f/u phone appointments (easier for her than in person because she is so tired).  I also hope to see her when she is on campus for future tx/appts.    [place], [place], [facility] Pager [phone] Voicemail [phone]

## 2016-05-02 NOTE — Patient Instructions (Addendum)
[facility] Discharge Instructions for Patients Receiving Chemotherapy  Today you received the following chemotherapy agents Alimta, Avastin, and Carboplatin To help prevent nausea and vomiting after your treatment, we encourage you to take your nausea medication as prescribed.  If you develop nausea and vomiting that is not controlled by your nausea medication, call the clinic.   BELOW ARE SYMPTOMS THAT SHOULD BE REPORTED IMMEDIATELY:  *FEVER GREATER THAN 100.5 F  *CHILLS WITH OR WITHOUT FEVER  NAUSEA AND VOMITING THAT IS NOT CONTROLLED WITH YOUR NAUSEA MEDICATION  *UNUSUAL SHORTNESS OF BREATH  *UNUSUAL BRUISING OR BLEEDING  TENDERNESS IN MOUTH AND THROAT WITH OR WITHOUT PRESENCE OF ULCERS  *URINARY PROBLEMS  *BOWEL PROBLEMS  UNUSUAL RASH Items with * indicate a potential emergency and should be followed up as soon as possible.  Feel free to call the clinic you have any questions or concerns. The clinic phone number is [phone].  Please show the [facility] at check-in to the Emergency Department and triage nurse.   Bevacizumab injection (Avastin) What is this medicine? BEVACIZUMAB (be va SIZ yoo mab) is a monoclonal antibody. It is used to treat cervical cancer, colorectal cancer, glioblastoma multiforme, non-small cell lung cancer (NSCLC), ovarian cancer, and renal cell cancer. This medicine may be used for other purposes; ask your health care provider or pharmacist if you have questions. COMMON BRAND NAME(S): Avastin What should I tell my health care provider before I take this medicine? They need to know if you have any of these conditions: -blood clots -heart disease, including heart failure, heart attack, or chest pain (angina) -high blood pressure -infection (especially a virus infection such as chickenpox, cold sores, or herpes) -kidney disease -lung disease -prior chemotherapy with doxorubicin, daunorubicin, epirubicin, or other  anthracycline type chemotherapy agents -recent or ongoing radiation therapy -recent surgery -stroke -an unusual or allergic reaction to bevacizumab, hamster proteins, mouse proteins, other medicines, foods, dyes, or preservatives -pregnant or trying to get pregnant -breast-feeding How should I use this medicine? This medicine is for infusion into a vein. It is given by a health care professional in a hospital or clinic setting. Talk to your pediatrician regarding the use of this medicine in children. Special care may be needed. Overdosage: If you think you have taken too much of this medicine contact a poison control center or emergency room at once. NOTE: This medicine is only for you. Do not share this medicine with others. What if I miss a dose? It is important not to miss your dose. Call your doctor or health care professional if you are unable to keep an appointment. What may interact with this medicine? Interactions are not expected. This list may not describe all possible interactions. Give your health care provider a list of all the medicines, herbs, non-prescription drugs, or dietary supplements you use. Also tell them if you smoke, drink alcohol, or use illegal drugs. Some items may interact with your medicine. What should I watch for while using this medicine? Your condition will be monitored carefully while you are receiving this medicine. You will need important blood work and urine testing done while you are taking this medicine. During your treatment, let your health care professional know if you have any unusual symptoms, such as difficulty breathing. This medicine may rarely cause 'gastrointestinal perforation' (holes in the stomach, intestines or colon), a serious side effect requiring surgery to repair. This medicine should be started at least 28 days following major surgery and the site  of the surgery should be totally healed. Check with your doctor before scheduling dental  work or surgery while you are receiving this treatment. Talk to your doctor if you have recently had surgery or if you have a wound that has not healed. Do not become pregnant while taking this medicine or for 6 months after stopping it. Women should inform their doctor if they wish to become pregnant or think they might be pregnant. There is a potential for serious side effects to an unborn child. Talk to your health care professional or pharmacist for more information. Do not breast-feed an infant while taking this medicine. This medicine has caused ovarian failure in some women. This medicine may interfere with the ability to have a child. You should talk to your doctor or health care professional if you are concerned about your fertility. What side effects may I notice from receiving this medicine? Side effects that you should report to your doctor or health care professional as soon as possible: -allergic reactions like skin rash, itching or hives, swelling of the face, lips, or tongue -breathing problems -changes in vision -chest pain -confusion -jaw pain, especially after dental work -mouth sores -seizures -severe abdominal pain -severe headache -signs of decreased platelets or bleeding - bruising, pinpoint red spots on the skin, black, tarry stools, nosebleeds, blood in the urine -signs of infection - fever or chills, cough, sore throat, pain or trouble passing urine -sudden numbness or weakness of the face, arm or leg -swelling of legs or ankles -symptoms of a stroke: change in mental awareness, inability to talk or move one side of the body (especially in patients with lung cancer) -trouble passing urine or change in the amount of urine -trouble speaking or understanding -trouble walking, dizziness, loss of balance or coordination Side effects that usually do not require medical attention (report to your doctor or health care professional if they continue or are  bothersome): -constipation -diarrhea -dry skin -headache -loss of appetite -nausea, vomiting This list may not describe all possible side effects. Call your doctor for medical advice about side effects. You may report side effects to FDA at 1-800-FDA-1088. Where should I keep my medicine? This drug is given in a hospital or clinic and will not be stored at home. NOTE: This sheet is a summary. It may not cover all possible information. If you have questions about this medicine, talk to your doctor, pharmacist, or health care provider.  2017 Elsevier/Gold Standard (2015-03-25 15:28:53)  Carboplatin injection What is this medicine? CARBOPLATIN (KAR boe pla tin) is a chemotherapy drug. It targets fast dividing cells, like cancer cells, and causes these cells to die. This medicine is used to treat ovarian cancer and many other cancers. This medicine may be used for other purposes; ask your health care provider or pharmacist if you have questions. COMMON BRAND NAME(S): Paraplatin What should I tell my health care provider before I take this medicine? They need to know if you have any of these conditions: -blood disorders -hearing problems -kidney disease -recent or ongoing radiation therapy -an unusual or allergic reaction to carboplatin, cisplatin, other chemotherapy, other medicines, foods, dyes, or preservatives -pregnant or trying to get pregnant -breast-feeding How should I use this medicine? This drug is usually given as an infusion into a vein. It is administered in a hospital or clinic by a specially trained health care professional. Talk to your pediatrician regarding the use of this medicine in children. Special care may be needed. Overdosage: If you  think you have taken too much of this medicine contact a poison control center or emergency room at once. NOTE: This medicine is only for you. Do not share this medicine with others. What if I miss a dose? It is important not to  miss a dose. Call your doctor or health care professional if you are unable to keep an appointment. What may interact with this medicine? -medicines for seizures -medicines to increase blood counts like filgrastim, pegfilgrastim, sargramostim -some antibiotics like amikacin, gentamicin, neomycin, streptomycin, tobramycin -vaccines Talk to your doctor or health care professional before taking any of these medicines: -acetaminophen -aspirin -ibuprofen -ketoprofen -naproxen This list may not describe all possible interactions. Give your health care provider a list of all the medicines, herbs, non-prescription drugs, or dietary supplements you use. Also tell them if you smoke, drink alcohol, or use illegal drugs. Some items may interact with your medicine. What should I watch for while using this medicine? Your condition will be monitored carefully while you are receiving this medicine. You will need important blood work done while you are taking this medicine. This drug may make you feel generally unwell. This is not uncommon, as chemotherapy can affect healthy cells as well as cancer cells. Report any side effects. Continue your course of treatment even though you feel ill unless your doctor tells you to stop. In some cases, you may be given additional medicines to help with side effects. Follow all directions for their use. Call your doctor or health care professional for advice if you get a fever, chills or sore throat, or other symptoms of a cold or flu. Do not treat yourself. This drug decreases your body's ability to fight infections. Try to avoid being around people who are sick. This medicine may increase your risk to bruise or bleed. Call your doctor or health care professional if you notice any unusual bleeding. Be careful brushing and flossing your teeth or using a toothpick because you may get an infection or bleed more easily. If you have any dental work done, tell your dentist you are  receiving this medicine. Avoid taking products that contain aspirin, acetaminophen, ibuprofen, naproxen, or ketoprofen unless instructed by your doctor. These medicines may hide a fever. Do not become pregnant while taking this medicine. Women should inform their doctor if they wish to become pregnant or think they might be pregnant. There is a potential for serious side effects to an unborn child. Talk to your health care professional or pharmacist for more information. Do not breast-feed an infant while taking this medicine. What side effects may I notice from receiving this medicine? Side effects that you should report to your doctor or health care professional as soon as possible: -allergic reactions like skin rash, itching or hives, swelling of the face, lips, or tongue -signs of infection - fever or chills, cough, sore throat, pain or difficulty passing urine -signs of decreased platelets or bleeding - bruising, pinpoint red spots on the skin, black, tarry stools, nosebleeds -signs of decreased red blood cells - unusually weak or tired, fainting spells, lightheadedness -breathing problems -changes in hearing -changes in vision -chest pain -high blood pressure -low blood counts - This drug may decrease the number of white blood cells, red blood cells and platelets. You may be at increased risk for infections and bleeding. -nausea and vomiting -pain, swelling, redness or irritation at the injection site -pain, tingling, numbness in the hands or feet -problems with balance, talking, walking -trouble passing urine or  change in the amount of urine Side effects that usually do not require medical attention (report to your doctor or health care professional if they continue or are bothersome): -hair loss -loss of appetite -metallic taste in the mouth or changes in taste This list may not describe all possible side effects. Call your doctor for medical advice about side effects. You may report  side effects to FDA at 1-800-FDA-1088. Where should I keep my medicine? This drug is given in a hospital or clinic and will not be stored at home. NOTE: This sheet is a summary. It may not cover all possible information. If you have questions about this medicine, talk to your doctor, pharmacist, or health care provider.  2017 Elsevier/Gold Standard (2007-07-08 14:38:05)  Pemetrexed injection (Alimta) What is this medicine? PEMETREXED (PEM e TREX ed) is a chemotherapy drug. This medicine affects cells that are rapidly growing, such as cancer cells and cells in your mouth and stomach. It is usually used to treat lung cancers like non-small cell lung cancer and mesothelioma. It may also be used to treat other cancers. This medicine may be used for other purposes; ask your health care provider or pharmacist if you have questions. COMMON BRAND NAME(S): Alimta What should I tell my health care provider before I take this medicine? They need to know if you have any of these conditions: -if you frequently drink alcohol containing beverages -infection (especially a virus infection such as chickenpox, cold sores, or herpes) -kidney disease -liver disease -low blood counts, like low platelets, red bloods, or white blood cells -an unusual or allergic reaction to pemetrexed, mannitol, other medicines, foods, dyes, or preservatives -pregnant or trying to get pregnant -breast-feeding How should I use this medicine? This drug is given as an infusion into a vein. It is administered in a hospital or clinic by a specially trained health care professional. Talk to your pediatrician regarding the use of this medicine in children. Special care may be needed. Overdosage: If you think you have taken too much of this medicine contact a poison control center or emergency room at once. NOTE: This medicine is only for you. Do not share this medicine with others. What if I miss a dose? It is important not to miss  your dose. Call your doctor or health care professional if you are unable to keep an appointment. What may interact with this medicine? -aspirin and aspirin-like medicines -medicines to increase blood counts like filgrastim, pegfilgrastim, sargramostim -methotrexate -NSAIDS, medicines for pain and inflammation, like ibuprofen or naproxen -probenecid -pyrimethamine -vaccines Talk to your doctor or health care professional before taking any of these medicines: -acetaminophen -aspirin -ibuprofen -ketoprofen -naproxen This list may not describe all possible interactions. Give your health care provider a list of all the medicines, herbs, non-prescription drugs, or dietary supplements you use. Also tell them if you smoke, drink alcohol, or use illegal drugs. Some items may interact with your medicine. What should I watch for while using this medicine? Visit your doctor for checks on your progress. This drug may make you feel generally unwell. This is not uncommon, as chemotherapy can affect healthy cells as well as cancer cells. Report any side effects. Continue your course of treatment even though you feel ill unless your doctor tells you to stop. In some cases, you may be given additional medicines to help with side effects. Follow all directions for their use. Call your doctor or health care professional for advice if you get a fever, chills or  sore throat, or other symptoms of a cold or flu. Do not treat yourself. This drug decreases your body's ability to fight infections. Try to avoid being around people who are sick. This medicine may increase your risk to bruise or bleed. Call your doctor or health care professional if you notice any unusual bleeding. Be careful brushing and flossing your teeth or using a toothpick because you may get an infection or bleed more easily. If you have any dental work done, tell your dentist you are receiving this medicine. Avoid taking products that contain  aspirin, acetaminophen, ibuprofen, naproxen, or ketoprofen unless instructed by your doctor. These medicines may hide a fever. Call your doctor or health care professional if you get diarrhea or mouth sores. Do not treat yourself. To protect your kidneys, drink water or other fluids as directed while you are taking this medicine. Men and women must use effective birth control while taking this medicine. You may also need to continue using effective birth control for a time after stopping this medicine. Do not become pregnant while taking this medicine. Tell your doctor right away if you think that you or your partner might be pregnant. There is a potential for serious side effects to an unborn child. Talk to your health care professional or pharmacist for more information. Do not breast-feed an infant while taking this medicine. This medicine may lower sperm counts. What side effects may I notice from receiving this medicine? Side effects that you should report to your doctor or health care professional as soon as possible: -allergic reactions like skin rash, itching or hives, swelling of the face, lips, or tongue -low blood counts - this medicine may decrease the number of white blood cells, red blood cells and platelets. You may be at increased risk for infections and bleeding. -signs of infection - fever or chills, cough, sore throat, pain or difficulty passing urine -signs of decreased platelets or bleeding - bruising, pinpoint red spots on the skin, black, tarry stools, blood in the urine -signs of decreased red blood cells - unusually weak or tired, fainting spells, lightheadedness -breathing problems, like a dry cough -changes in emotions or moods -chest pain -confusion -diarrhea -high blood pressure -mouth or throat sores or ulcers -pain, swelling, warmth in the leg -pain on swallowing -swelling of the ankles, feet, hands -trouble passing urine or change in the amount of  urine -vomiting -yellowing of the eyes or skin Side effects that usually do not require medical attention (report to your doctor or health care professional if they continue or are bothersome): -hair loss -loss of appetite -nausea -stomach upset This list may not describe all possible side effects. Call your doctor for medical advice about side effects. You may report side effects to FDA at 1-800-FDA-1088. Where should I keep my medicine? This drug is given in a hospital or clinic and will not be stored at home. NOTE: This sheet is a summary. It may not cover all possible information. If you have questions about this medicine, talk to your doctor, pharmacist, or health care provider.  2017 [employer]/Gold Standard (2007-11-04 13:24:03)

## 2016-05-03 ENCOUNTER — Telehealth: Payer: Self-pay | Admitting: Medical Oncology

## 2016-05-03 NOTE — Telephone Encounter (Signed)
Pt feels fine no problems.  I reminded her to increase fluids and to call for any concerns.

## 2016-05-10 ENCOUNTER — Encounter: Payer: Self-pay | Admitting: Internal Medicine

## 2016-05-10 ENCOUNTER — Ambulatory Visit (HOSPITAL_BASED_OUTPATIENT_CLINIC_OR_DEPARTMENT_OTHER): Payer: Medicare HMO | Admitting: Internal Medicine

## 2016-05-10 ENCOUNTER — Other Ambulatory Visit (HOSPITAL_BASED_OUTPATIENT_CLINIC_OR_DEPARTMENT_OTHER): Payer: Medicare HMO

## 2016-05-10 VITALS — BP 111/62 | HR 88 | Temp 98.7°F | Resp 17 | Ht 64.0 in | Wt 125.1 lb

## 2016-05-10 DIAGNOSIS — C3412 Malignant neoplasm of upper lobe, left bronchus or lung: Secondary | ICD-10-CM

## 2016-05-10 DIAGNOSIS — Z5111 Encounter for antineoplastic chemotherapy: Secondary | ICD-10-CM

## 2016-05-10 DIAGNOSIS — Z7189 Other specified counseling: Secondary | ICD-10-CM

## 2016-05-10 DIAGNOSIS — C7951 Secondary malignant neoplasm of bone: Secondary | ICD-10-CM

## 2016-05-10 DIAGNOSIS — C3492 Malignant neoplasm of unspecified part of left bronchus or lung: Secondary | ICD-10-CM

## 2016-05-10 DIAGNOSIS — C7931 Secondary malignant neoplasm of brain: Secondary | ICD-10-CM | POA: Diagnosis not present

## 2016-05-10 DIAGNOSIS — E039 Hypothyroidism, unspecified: Secondary | ICD-10-CM

## 2016-05-10 DIAGNOSIS — R634 Abnormal weight loss: Secondary | ICD-10-CM | POA: Diagnosis not present

## 2016-05-10 LAB — COMPREHENSIVE METABOLIC PANEL
ALBUMIN: 3.9 g/dL (ref 3.5–5.0)
ALK PHOS: 121 U/L (ref 40–150)
ALT: 39 U/L (ref 0–55)
ANION GAP: 7 meq/L (ref 3–11)
AST: 31 U/L (ref 5–34)
BILIRUBIN TOTAL: 0.26 mg/dL (ref 0.20–1.20)
BUN: 13.7 mg/dL (ref 7.0–26.0)
CALCIUM: 9.1 mg/dL (ref 8.4–10.4)
CO2: 26 meq/L (ref 22–29)
Chloride: 103 mEq/L (ref 98–109)
Creatinine: 1 mg/dL (ref 0.6–1.1)
EGFR: 62 mL/min/{1.73_m2} — AB (ref >90)
Glucose: 98 mg/dl (ref 70–140)
Potassium: 4.4 mEq/L (ref 3.5–5.1)
Sodium: 136 mEq/L (ref 136–145)
TOTAL PROTEIN: 6.7 g/dL (ref 6.4–8.3)

## 2016-05-10 LAB — CBC WITH DIFFERENTIAL/PLATELET
BASO%: 0.3 % (ref 0.0–2.0)
Basophils Absolute: 0 10*3/uL (ref 0.0–0.1)
EOS ABS: 0.1 10*3/uL (ref 0.0–0.5)
EOS%: 1.6 % (ref 0.0–7.0)
HEMATOCRIT: 36.2 % (ref 34.8–46.6)
HGB: 12.1 g/dL (ref 11.6–15.9)
LYMPH%: 12.6 % — AB (ref 14.0–49.7)
MCH: 28.5 pg (ref 25.1–34.0)
MCHC: 33.5 g/dL (ref 31.5–36.0)
MCV: 85.1 fL (ref 79.5–101.0)
MONO#: 0.4 10*3/uL (ref 0.1–0.9)
MONO%: 7.5 % (ref 0.0–14.0)
NEUT%: 78 % — AB (ref 38.4–76.8)
NEUTROS ABS: 4.4 10*3/uL (ref 1.5–6.5)
PLATELETS: 302 10*3/uL (ref 145–400)
RBC: 4.26 10*6/uL (ref 3.70–5.45)
RDW: 14.6 % — ABNORMAL HIGH (ref 11.2–14.5)
WBC: 5.6 10*3/uL (ref 3.9–10.3)
lymph#: 0.7 10*3/uL — ABNORMAL LOW (ref 0.9–3.3)

## 2016-05-10 NOTE — Progress Notes (Signed)
Jenna Molina [phone]   [phone]  OFFICE PROGRESS NOTE  Jenna Solo, MD [place] [address]  DIAGNOSIS: Stage IV (T2a, N1, M1b) non-small cell lung cancer, adenocarcinoma presented with left upper lobe lung mass, left hilar adenopathy and multiple brain metastasis as well as metastatic bone disease diagnosed in November 2017. PDL1 expression is 20%.  Genomic Alterations Identified? ERBB2 amplification - equivocal? STK11 K146* KRAS G12D KEAP1 Q359* LYN amplification MCL1 amplification - equivocal? Additional Findings? Microsatellite status MS-Stable Tumor Mutation Burden TMB-Intermediate; 15 Muts/Mb Additional Disease-relevant Genes with No Reportable Alterations Identified? EGFR ALK BRAF MET RET ROS1  PRIOR THERAPY: Whole brain irradiation to multiple metastatic brain lesions under the care of Dr. Tammi Klippel.  CURRENT THERAPY: Systemic chemotherapy with carboplatin for AUC of 5, Alimta 500 MG/M2 and Avastin 15 MG/KG every 3 weeks. First dose 05/01/2016.  INTERVAL HISTORY: Jenna Molina 61 y.o. female came to the clinic today for follow-up visit accompanied by her husband. The patient started chemotherapy last week. She had several issues after her treatment including increasing fatigue and weakness in addition to several episodes of diarrhea but she did not take Imodium. She also has decreased appetite and dry heaves improved with her nausea medicine. She also complains of rash on the back of her neck. She denied having any fever or chills. She has no current nausea or vomiting. She denied having any significant chest pain, shortness of breath, cough or hemoptysis. The patient lost around 7 pounds since her last visit. She is here today for evaluation and repeat blood work. She also had repeat CT scan of the chest, abdomen and pelvis before starting the first dose of her chemotherapy and she would like to review the  scan results.  MEDICAL HISTORY: Past Medical History:  Diagnosis Date  . Adenocarcinoma of left lung, stage 4 ([facility]) 03/01/2016  . Anxiety   . Cancer ([facility])    Cervical cancer, lung cancer   . Chronic bronchitis ([place])   . Depression   . Encounter for antineoplastic chemotherapy 04/25/2016  . Fibromyalgia   . GERD (gastroesophageal reflux disease)   . Goals of care, counseling/discussion 04/25/2016  . Heart murmur    Mitral Valve prolapse  . Hypothyroidism   . Irritable bowel    constipation  . Migraines   . Neuromuscular disorder ([facility])   . Pneumonia     ALLERGIES:  is allergic to aspirin; lyrica [pregabalin]; sulfa antibiotics; adhesive [tape]; and neosporin [neomycin-bacitracin zn-polymyx].  MEDICATIONS:  Current Outpatient Prescriptions  Medication Sig Dispense Refill  . Acetaminophen 500 MG coapsule Take 250-500 mg by mouth 3 (three) times daily. Takes 224m at 0030 and 0800 and 5082mat bedtime (1430)    . cholecalciferol (VITAMIN D) 1000 UNITS tablet Take 5,000 Units by mouth every morning.    . citalopram (CELEXA) 40 MG tablet Take 60 mg by mouth at bedtime.     . conjugated estrogens (PREMARIN) vaginal cream Place 1 Applicatorful vaginally 2 (two) times a week.    . cyclobenzaprine (FLEXERIL) 10 MG tablet Take 1 tablet (10 mg total) by mouth 3 (three) times daily as needed for muscle spasms. 90 tablet 4  . dexamethasone (DECADRON) 4 MG tablet Take 1 tablet (4 mg total) by mouth 3 (three) times daily. 60 tablet 0  . dexamethasone (DECADRON) 4 MG tablet 4 mg by mouth twice a day the day before, day of and day after the chemotherapy every 3 weeks. 40Jensen  tablet 1  . emollient (BIAFINE) cream Apply topically as needed.    . fentaNYL 37.5 MCG/HR PT72 Place 37.5 mcg onto the skin every 3 (three) days. 10 patch 0  . folic acid (FOLVITE) 1 MG tablet Take 1 tablet (1 mg total) by mouth daily. 30 tablet 4  . HYDROcodone-acetaminophen (NORCO) 10-325 MG tablet Take 2 tablets by mouth every  8 (eight) hours. 190 tablet 0  . levothyroxine (SYNTHROID, LEVOTHROID) 100 MCG tablet Take 100 mcg by mouth every morning.    Marland Kitchen LORazepam (ATIVAN) 1 MG tablet Take 1 mg by mouth every 6 (six) hours.    . ondansetron (ZOFRAN) 8 MG tablet Take 1 tablet (8 mg total) by mouth every 8 (eight) hours as needed for nausea or vomiting. 30 tablet 3  . Polyethyl Glycol-Propyl Glycol (SYSTANE OP) Place 2 drops into both eyes daily as needed (dryness).    . prochlorperazine (COMPAZINE) 10 MG tablet Take 1 tablet (10 mg total) by mouth every 6 (six) hours as needed for nausea or vomiting. 30 tablet 0  . promethazine (PHENERGAN) 25 MG tablet Take 25 mg by mouth every 6 (six) hours as needed. With Imitrex For nausea    . SUMAtriptan (IMITREX) 100 MG tablet Take 100 mg by mouth every 2 (two) hours as needed for migraine. May repeat in 2 hours if headache persists or recurs.    . SUMAtriptan (IMITREX) 6 MG/0.5ML SOLN injection Inject 0.5 mLs (6 mg total) into the skin every 2 (two) hours as needed for migraine or headache. May repeat in 2 hours if headache persists or recurs. 5 vial 2  . topiramate (TOPAMAX) 100 MG tablet Take 1 tablet (100 mg total) by mouth 2 (two) times daily. 60 tablet 3  . vitamin E 400 UNIT capsule Take 400 Units by mouth every morning.     No current facility-administered medications for this visit.     SURGICAL HISTORY:  Past Surgical History:  Procedure Laterality Date  . ABDOMINAL HYSTERECTOMY    . APPENDECTOMY    . CHOLECYSTECTOMY N/A 04/01/2014   Procedure: LAPAROSCOPIC CHOLECYSTECTOMY WITH INTRAOPERATIVE CHOLANGIOGRAM;  Surgeon: Jackolyn Confer, MD;  Location: [facility];  Service: General;  Laterality: N/A;  . ERCP N/A 04/02/2014   Procedure: ENDOSCOPIC RETROGRADE CHOLANGIOPANCREATOGRAPHY (ERCP);  Surgeon: Milus Banister, MD;  Location: [facility];  Service: Gastroenterology;  Laterality: N/A;  . INNER EAR SURGERY    . MASTOIDECTOMY    . MOLE REMOVAL    . RADIOLOGY WITH ANESTHESIA  N/A 02/28/2016   Procedure: MRI OF THE BRAIN WITH AND WITHOUT;  Surgeon: Medication Radiologist, MD;  Location: [facility];  Service: Radiology;  Laterality: N/A;    REVIEW OF SYSTEMS:  Constitutional: positive for anorexia, fatigue and weight loss Eyes: negative Ears, nose, mouth, throat, and face: negative Respiratory: negative Cardiovascular: negative Gastrointestinal: positive for diarrhea Genitourinary:negative Integument/breast: negative Hematologic/lymphatic: negative Musculoskeletal:negative Neurological: negative Behavioral/Psych: negative Endocrine: negative Allergic/Immunologic: negative   PHYSICAL EXAMINATION: General appearance: alert, cooperative, fatigued and no distress Head: Normocephalic, without obvious abnormality, atraumatic Neck: no adenopathy, no JVD, supple, symmetrical, trachea midline and thyroid not enlarged, symmetric, no tenderness/mass/nodules Lymph nodes: Cervical, supraclavicular, and axillary nodes normal. Resp: clear to auscultation bilaterally Back: symmetric, no curvature. ROM normal. No CVA tenderness. Cardio: regular rate and rhythm, S1, S2 normal, no murmur, click, rub or gallop GI: soft, non-tender; bowel sounds normal; no masses,  no organomegaly Extremities: extremities normal, atraumatic, no cyanosis or edema Neurologic: Alert and oriented X 3, normal strength  and tone. Normal symmetric reflexes. Normal coordination and gait  ECOG PERFORMANCE STATUS: 1 - Symptomatic but completely ambulatory  Blood pressure 111/62, pulse 88, temperature 98.7 F (37.1 C), temperature source Oral, resp. rate 17, height 5' 4"  (1.626 m), weight 125 lb 1.6 oz (56.7 kg), SpO2 93 %.  LABORATORY DATA: Lab Results  Component Value Date   WBC 5.6 05/10/2016   HGB 12.1 05/10/2016   HCT 36.2 05/10/2016   MCV 85.1 05/10/2016   PLT 302 05/10/2016      Chemistry      Component Value Date/Time   NA 138 05/02/2016 0936   K 4.1 05/02/2016 0936   CL 102 01/11/2016  1037   CO2 23 05/02/2016 0936   BUN 14.1 05/02/2016 0936   CREATININE 1.0 05/02/2016 0936      Component Value Date/Time   CALCIUM 9.3 05/02/2016 0936   ALKPHOS 102 05/02/2016 0936   AST 14 05/02/2016 0936   ALT 16 05/02/2016 0936   BILITOT <0.22 05/02/2016 0936       RADIOGRAPHIC STUDIES: Ct Chest W Contrast  Result Date: 04/30/2016 CLINICAL DATA:  61 year old female with history of stage IV adenocarcinoma of the left lung. Restaging examination before initiation of chemotherapy. EXAM: CT CHEST, ABDOMEN, AND PELVIS WITH CONTRAST TECHNIQUE: Multidetector CT imaging of the chest, abdomen and pelvis was performed following the standard protocol during bolus administration of intravenous contrast. CONTRAST:  193m ISOVUE-300 IOPAMIDOL (ISOVUE-300) INJECTION 61% COMPARISON:  Outside chest CT 02/07/2016.  PET-CT 02/14/2016. FINDINGS: CT CHEST FINDINGS Cardiovascular: Heart size is normal. There is no significant pericardial fluid, thickening or pericardial calcification. Minimal atherosclerotic calcifications are noted in the thoracic aorta apparent no definite coronary artery calcifications are identified. Mediastinum/Nodes: Prominent soft tissue in the left hilar region presumably reflects confluent lymphadenopathy, measuring up to 1 cm in thickness. 12 mm short axis right hilar lymph node also noted. No mediastinal lymphadenopathy. Esophagus is unremarkable in appearance. No axillary lymphadenopathy. Lungs/Pleura: Previously noted left upper lobe mass has significantly increased in size compared to the prior examination, and is now associated with surrounding volume loss and airspace consolidation making accurate measurement of the mass challenging. The mass is estimated to measure approximately 4.8 x 4.5 cm on today's examination (image 40 of series 4). The extensive adjacent septal thickening and consolidative changes may reflect additional areas of local invasion, or could reflect some  postobstructive changes. Some of these changes do extend toward the left hilum, favoring some degree of malignant extension. Importantly, there are innumerable new and enlarging pulmonary nodules scattered throughout all aspects of the lungs bilaterally which appear randomly distributed, highly concerning for widespread metastatic disease to the lungs. Increasing small left pleural effusion. Musculoskeletal: Well-defined sclerotic lesion with narrow zone of transition in the right side of the T2 vertebral body is similar to the prior study, presumably a bone island. CT ABDOMEN PELVIS FINDINGS Hepatobiliary: Diffuse low attenuation throughout the hepatic parenchyma, compatible with a background of hepatic steatosis. In segment 3 of the liver there is a benign mm well-defined low-attenuation lesion which is similar to the prior CT examination, too small to characterize, but favored to represent a tiny cyst. No other suspicious hepatic lesions are noted. Pancreas: No pancreatic mass. No pancreatic ductal dilatation. No pancreatic or peripancreatic fluid or inflammatory changes. Spleen: Unremarkable. Adrenals/Urinary Tract: Bilateral adrenal glands and the left kidney are normal in appearance. subcentimeter low-attenuation lesion in the upper pole of the right kidney is too small to characterize, but statistically likely a  tiny cyst. No hydroureteronephrosis. Urinary bladder is normal in appearance. Stomach/Bowel: The appearance of the stomach is normal. Large duodenal diverticulum from the third portion of the duodenum incidentally noted. No surrounding inflammatory changes to suggest diverticulitis at this time. No pathologic dilatation of small bowel or colon. The appendix is not confidently identified and may be surgically absent. Regardless, there are no inflammatory changes noted adjacent to the cecum to suggest the presence of an acute appendicitis at this time. Vascular/Lymphatic: Aortic atherosclerosis, without  evidence of aneurysm or dissection in the abdominal or pelvic vasculature. No lymphadenopathy noted in the abdomen or pelvis. Surgical clips along the right pelvic sidewall, suggesting prior lymph node dissection. Reproductive: Status post hysterectomy. Ovaries are not confidently identified and likely surgically absent, or potentially atrophic. Other: Tiny supraumbilical ventral hernia containing only omental fat No significant volume of ascites. No pneumoperitoneum. Musculoskeletal: Enlarging faintly sclerotic lesion in the right side of the L1 vertebral body which currently measures 3.1 x 2.2 cm (image 136 of series 4). Well-defined sclerotic lesion with narrow zone of transition in the left ilium is similar to the prior study, presumably a small bone island. IMPRESSION: 1. Progression of disease as evidenced by interval enlargement of previously noted left upper lobe mass, development of a left-sided pleural effusion which is likely malignant, progressive mild lateral hilar lymphadenopathy, enlarging osseous lesion in the L1 vertebral body, and interval increase in number and size of innumerable small pulmonary nodules scattered throughout the lungs bilaterally which presumably reflect metastatic lesions. 2. Aortic atherosclerosis. 3. Hepatic steatosis. 4. Additional incidental findings, as above. Electronically Signed   By: Vinnie Langton M.D.   On: 04/30/2016 09:46   Ct Abdomen Pelvis W Contrast  Result Date: 04/30/2016 CLINICAL DATA:  61 year old female with history of stage IV adenocarcinoma of the left lung. Restaging examination before initiation of chemotherapy. EXAM: CT CHEST, ABDOMEN, AND PELVIS WITH CONTRAST TECHNIQUE: Multidetector CT imaging of the chest, abdomen and pelvis was performed following the standard protocol during bolus administration of intravenous contrast. CONTRAST:  168m ISOVUE-300 IOPAMIDOL (ISOVUE-300) INJECTION 61% COMPARISON:  Outside chest CT 02/07/2016.  PET-CT  02/14/2016. FINDINGS: CT CHEST FINDINGS Cardiovascular: Heart size is normal. There is no significant pericardial fluid, thickening or pericardial calcification. Minimal atherosclerotic calcifications are noted in the thoracic aorta apparent no definite coronary artery calcifications are identified. Mediastinum/Nodes: Prominent soft tissue in the left hilar region presumably reflects confluent lymphadenopathy, measuring up to 1 cm in thickness. 12 mm short axis right hilar lymph node also noted. No mediastinal lymphadenopathy. Esophagus is unremarkable in appearance. No axillary lymphadenopathy. Lungs/Pleura: Previously noted left upper lobe mass has significantly increased in size compared to the prior examination, and is now associated with surrounding volume loss and airspace consolidation making accurate measurement of the mass challenging. The mass is estimated to measure approximately 4.8 x 4.5 cm on today's examination (image 40 of series 4). The extensive adjacent septal thickening and consolidative changes may reflect additional areas of local invasion, or could reflect some postobstructive changes. Some of these changes do extend toward the left hilum, favoring some degree of malignant extension. Importantly, there are innumerable new and enlarging pulmonary nodules scattered throughout all aspects of the lungs bilaterally which appear randomly distributed, highly concerning for widespread metastatic disease to the lungs. Increasing small left pleural effusion. Musculoskeletal: Well-defined sclerotic lesion with narrow zone of transition in the right side of the T2 vertebral body is similar to the prior study, presumably a bone island. CT ABDOMEN PELVIS  FINDINGS Hepatobiliary: Diffuse low attenuation throughout the hepatic parenchyma, compatible with a background of hepatic steatosis. In segment 3 of the liver there is a benign mm well-defined low-attenuation lesion which is similar to the prior CT  examination, too small to characterize, but favored to represent a tiny cyst. No other suspicious hepatic lesions are noted. Pancreas: No pancreatic mass. No pancreatic ductal dilatation. No pancreatic or peripancreatic fluid or inflammatory changes. Spleen: Unremarkable. Adrenals/Urinary Tract: Bilateral adrenal glands and the left kidney are normal in appearance. subcentimeter low-attenuation lesion in the upper pole of the right kidney is too small to characterize, but statistically likely a tiny cyst. No hydroureteronephrosis. Urinary bladder is normal in appearance. Stomach/Bowel: The appearance of the stomach is normal. Large duodenal diverticulum from the third portion of the duodenum incidentally noted. No surrounding inflammatory changes to suggest diverticulitis at this time. No pathologic dilatation of small bowel or colon. The appendix is not confidently identified and may be surgically absent. Regardless, there are no inflammatory changes noted adjacent to the cecum to suggest the presence of an acute appendicitis at this time. Vascular/Lymphatic: Aortic atherosclerosis, without evidence of aneurysm or dissection in the abdominal or pelvic vasculature. No lymphadenopathy noted in the abdomen or pelvis. Surgical clips along the right pelvic sidewall, suggesting prior lymph node dissection. Reproductive: Status post hysterectomy. Ovaries are not confidently identified and likely surgically absent, or potentially atrophic. Other: Tiny supraumbilical ventral hernia containing only omental fat No significant volume of ascites. No pneumoperitoneum. Musculoskeletal: Enlarging faintly sclerotic lesion in the right side of the L1 vertebral body which currently measures 3.1 x 2.2 cm (image 136 of series 4). Well-defined sclerotic lesion with narrow zone of transition in the left ilium is similar to the prior study, presumably a small bone island. IMPRESSION: 1. Progression of disease as evidenced by interval  enlargement of previously noted left upper lobe mass, development of a left-sided pleural effusion which is likely malignant, progressive mild lateral hilar lymphadenopathy, enlarging osseous lesion in the L1 vertebral body, and interval increase in number and size of innumerable small pulmonary nodules scattered throughout the lungs bilaterally which presumably reflect metastatic lesions. 2. Aortic atherosclerosis. 3. Hepatic steatosis. 4. Additional incidental findings, as above. Electronically Signed   By: Vinnie Langton M.D.   On: 04/30/2016 09:46    ASSESSMENT AND PLAN:   this is a very pleasant 61 years old white female with a stage IV non-small cell lung cancer, adenocarcinoma with multiple brain metastasis status post whole brain irradiation.  The patient was started recently on systemic chemotherapy with carboplatin, Alimta and Avastin status post 1 cycle. She tolerated the first cycle well except for few side effects including fatigue , lack of appetite , mild nausea as well as diarrhea. She is feeling much better today.  She had repeat CT scan of the chest, abdomen and pelvis before starting the first dose of her treatment. I personally and independently reviewed the scan images and discuss the results and showed the images to the patient and her husband.  Unfortunately his scan showed clear evidence for progression in the interval before the patient started her treatment.  I recommended for her to continue her current systemic chemotherapy with carboplatin, Alimta and Avastin.  She is expected to start cycle #2 in 2 weeks.  the patient has several questions about her prognosis with and without treatment.  I answered them completely to her satisfaction. She is   debating whether to continue with her systemic chemotherapy or not.  for pain management, the patient will continue on fentanyl patch and Norco for breakthrough pain.  For the hypothyroidism she will continue her current treatment  with levothyroxine.  The patient was advised to call immediately if she has any concerning symptoms in the interval. The patient voices understanding of current disease status and treatment options and is in agreement with the current care plan.  All questions were answered. The patient knows to call the clinic with any problems, questions or concerns. We can certainly see the patient much sooner if necessary.  Disclaimer: This note was dictated with voice recognition software. Similar sounding words can inadvertently be transcribed and may not be corrected upon review.

## 2016-05-15 ENCOUNTER — Other Ambulatory Visit: Payer: Medicare HMO

## 2016-05-15 ENCOUNTER — Telehealth: Payer: Self-pay | Admitting: Internal Medicine

## 2016-05-15 NOTE — Telephone Encounter (Signed)
Per LOS r/s lab appt and lvm to inform pt of new appt date/time 2/1 at 8 am

## 2016-05-16 ENCOUNTER — Telehealth: Payer: Self-pay | Admitting: *Deleted

## 2016-05-16 NOTE — Telephone Encounter (Signed)
Oncology Nurse Navigator Documentation  Oncology Nurse Navigator Flowsheets 05/16/2016  Navigator Location [facility]  Navigator Encounter Type Telephone/I called to follow up with Mr. Wickes.  She has completed her first chemo.  She had a lot of side effects.  I listened as she explained. I updated her when she sees Dr. [name] next she could talk about treatment options.  I also spoke to her about staying away as much as she could from people that are sick and to wash her hands.  She was thankful for the call.   Telephone Outgoing Call  Treatment Phase Treatment  Barriers/Navigation Needs Education  Education Other  Interventions Education  Education Method Verbal  Acuity Level 1  Acuity Level 1 Minimal follow up required  Time Spent with Patient 30

## 2016-05-17 ENCOUNTER — Other Ambulatory Visit: Payer: Medicare HMO

## 2016-05-22 ENCOUNTER — Other Ambulatory Visit (HOSPITAL_BASED_OUTPATIENT_CLINIC_OR_DEPARTMENT_OTHER): Payer: Medicare HMO

## 2016-05-22 ENCOUNTER — Ambulatory Visit: Payer: Medicare HMO

## 2016-05-22 ENCOUNTER — Telehealth: Payer: Self-pay | Admitting: Internal Medicine

## 2016-05-22 ENCOUNTER — Ambulatory Visit (HOSPITAL_BASED_OUTPATIENT_CLINIC_OR_DEPARTMENT_OTHER): Payer: Medicare HMO | Admitting: Internal Medicine

## 2016-05-22 ENCOUNTER — Encounter: Payer: Self-pay | Admitting: *Deleted

## 2016-05-22 ENCOUNTER — Encounter: Payer: Self-pay | Admitting: Internal Medicine

## 2016-05-22 ENCOUNTER — Telehealth: Payer: Self-pay | Admitting: Medical Oncology

## 2016-05-22 VITALS — BP 83/71 | HR 86 | Temp 98.0°F | Resp 17 | Ht 64.0 in | Wt 125.2 lb

## 2016-05-22 DIAGNOSIS — C7931 Secondary malignant neoplasm of brain: Secondary | ICD-10-CM

## 2016-05-22 DIAGNOSIS — C3492 Malignant neoplasm of unspecified part of left bronchus or lung: Secondary | ICD-10-CM

## 2016-05-22 DIAGNOSIS — C3412 Malignant neoplasm of upper lobe, left bronchus or lung: Secondary | ICD-10-CM

## 2016-05-22 DIAGNOSIS — F418 Other specified anxiety disorders: Secondary | ICD-10-CM

## 2016-05-22 DIAGNOSIS — G8929 Other chronic pain: Secondary | ICD-10-CM

## 2016-05-22 DIAGNOSIS — R530 Neoplastic (malignant) related fatigue: Secondary | ICD-10-CM

## 2016-05-22 DIAGNOSIS — C7951 Secondary malignant neoplasm of bone: Secondary | ICD-10-CM

## 2016-05-22 DIAGNOSIS — R5381 Other malaise: Secondary | ICD-10-CM

## 2016-05-22 DIAGNOSIS — I959 Hypotension, unspecified: Secondary | ICD-10-CM

## 2016-05-22 DIAGNOSIS — R110 Nausea: Secondary | ICD-10-CM

## 2016-05-22 DIAGNOSIS — M545 Low back pain: Secondary | ICD-10-CM | POA: Diagnosis not present

## 2016-05-22 DIAGNOSIS — Z7189 Other specified counseling: Secondary | ICD-10-CM

## 2016-05-22 DIAGNOSIS — Z5111 Encounter for antineoplastic chemotherapy: Secondary | ICD-10-CM

## 2016-05-22 DIAGNOSIS — R5382 Chronic fatigue, unspecified: Secondary | ICD-10-CM

## 2016-05-22 DIAGNOSIS — R630 Anorexia: Secondary | ICD-10-CM

## 2016-05-22 LAB — COMPREHENSIVE METABOLIC PANEL
ALBUMIN: 3.7 g/dL (ref 3.5–5.0)
ALK PHOS: 141 U/L (ref 40–150)
ALT: 29 U/L (ref 0–55)
AST: 25 U/L (ref 5–34)
Anion Gap: 8 mEq/L (ref 3–11)
BUN: 12.7 mg/dL (ref 7.0–26.0)
CALCIUM: 9.4 mg/dL (ref 8.4–10.4)
CO2: 25 mEq/L (ref 22–29)
Chloride: 105 mEq/L (ref 98–109)
Creatinine: 1.1 mg/dL (ref 0.6–1.1)
EGFR: 57 mL/min/{1.73_m2} — ABNORMAL LOW (ref >90)
GLUCOSE: 99 mg/dL (ref 70–140)
Potassium: 4.4 mEq/L (ref 3.5–5.1)
SODIUM: 138 meq/L (ref 136–145)
TOTAL PROTEIN: 6.6 g/dL (ref 6.4–8.3)

## 2016-05-22 LAB — CBC WITH DIFFERENTIAL/PLATELET
BASO%: 0.7 % (ref 0.0–2.0)
Basophils Absolute: 0 10*3/uL (ref 0.0–0.1)
EOS ABS: 0.1 10*3/uL (ref 0.0–0.5)
EOS%: 2.9 % (ref 0.0–7.0)
HEMATOCRIT: 36.3 % (ref 34.8–46.6)
HEMOGLOBIN: 11.3 g/dL — AB (ref 11.6–15.9)
LYMPH#: 1 10*3/uL (ref 0.9–3.3)
LYMPH%: 24 % (ref 14.0–49.7)
MCH: 27.8 pg (ref 25.1–34.0)
MCHC: 31.1 g/dL — ABNORMAL LOW (ref 31.5–36.0)
MCV: 89.4 fL (ref 79.5–101.0)
MONO#: 0.7 10*3/uL (ref 0.1–0.9)
MONO%: 17.9 % — AB (ref 0.0–14.0)
NEUT%: 54.5 % (ref 38.4–76.8)
NEUTROS ABS: 2.3 10*3/uL (ref 1.5–6.5)
Platelets: 339 10*3/uL (ref 145–400)
RBC: 4.06 10*6/uL (ref 3.70–5.45)
RDW: 15.7 % — AB (ref 11.2–14.5)
WBC: 4.1 10*3/uL (ref 3.9–10.3)

## 2016-05-22 NOTE — Telephone Encounter (Signed)
No additional appointments to be scheduled per 05/22/16 los.

## 2016-05-22 NOTE — Progress Notes (Signed)
Jenna [phone]   [phone]  OFFICE PROGRESS NOTE  Tawanna Solo, MD [place] [address]  DIAGNOSIS: Stage IV (T2a, N1, M1b) non-small cell lung cancer, adenocarcinoma presented with left upper lobe lung mass, left hilar adenopathy and multiple brain metastasis as well as metastatic bone disease diagnosed in November 2017. PDL1 expression is 20%.  Genomic Alterations Identified? ERBB2 amplification - equivocal? STK11 K146* KRAS G12D KEAP1 Q359* LYN amplification MCL1 amplification - equivocal? Additional Findings? Microsatellite status MS-Stable Tumor Mutation Burden TMB-Intermediate; 15 Muts/Mb Additional Disease-relevant Genes with No Reportable Alterations Identified? EGFR ALK BRAF MET RET ROS1  PRIOR THERAPY: Whole brain irradiation to multiple metastatic brain lesions under the care of Dr. Tammi Klippel.  CURRENT THERAPY: Systemic chemotherapy with carboplatin for AUC of 5, Alimta 500 MG/M2 and Avastin 15 MG/KG every 3 weeks. First dose 05/01/2016. Status post one cycle.  INTERVAL HISTORY: Jenna Molina 61 y.o. female returns to the clinic today for follow-up visit accompanied by her husband. She tolerated the first cycle of her treatment reasonably well except for persistent fatigue as well as a lot of nausea and she is taking Zofran regularly. She also lost her appetite. She continues to have pain in the left mid back as well as the left lower back. She has been on chronic pain medication by her primary care physician. Her weight has been stable since her last visit. She denied having any chest pain but has shortness breath with exertion was no cough or hemoptysis. She denied having any fever or chills. She denied having any headache or visual changes. The patient is here today for evaluation before starting cycle #2.  MEDICAL HISTORY: Past Medical History:  Diagnosis Date  . Adenocarcinoma of left lung,  stage 4 ([facility]) 03/01/2016  . Anxiety   . Cancer (HCC)    Cervical cancer, lung cancer   . Chronic bronchitis ([facility])   . Depression   . Encounter for antineoplastic chemotherapy 04/25/2016  . Fibromyalgia   . GERD (gastroesophageal reflux disease)   . Goals of care, counseling/discussion 04/25/2016  . Heart murmur    Mitral Valve prolapse  . Hypothyroidism   . Irritable bowel    constipation  . Migraines   . Neuromuscular disorder ([facility])   . Pneumonia     ALLERGIES:  is allergic to aspirin; lyrica [pregabalin]; sulfa antibiotics; adhesive [tape]; and neosporin [neomycin-bacitracin zn-polymyx].  MEDICATIONS:  Current Outpatient Prescriptions  Medication Sig Dispense Refill  . Acetaminophen 500 MG coapsule Take 250-500 mg by mouth 3 (three) times daily. Takes 228m at 0030 and 0800 and 5032mat bedtime (1430)    . cholecalciferol (VITAMIN D) 1000 UNITS tablet Take 5,000 Units by mouth every morning.    . citalopram (CELEXA) 40 MG tablet Take 60 mg by mouth at bedtime.     . conjugated estrogens (PREMARIN) vaginal cream Place 1 Applicatorful vaginally 2 (two) times a week.    . cyclobenzaprine (FLEXERIL) 10 MG tablet Take 1 tablet (10 mg total) by mouth 3 (three) times daily as needed for muscle spasms. 90 tablet 4  . dexamethasone (DECADRON) 4 MG tablet Take 1 tablet (4 mg total) by mouth 3 (three) times daily. 60 tablet 0  . dexamethasone (DECADRON) 4 MG tablet 4 mg by mouth twice a day the day before, day of and day after the chemotherapy every 3 weeks. 40 tablet 1  . emollient (BIAFINE) cream Apply topically as needed.    .Marland Kitchen  fentaNYL 37.5 MCG/HR PT72 Place 37.5 mcg onto the skin every 3 (three) days. 10 patch 0  . folic acid (FOLVITE) 1 MG tablet Take 1 tablet (1 mg total) by mouth daily. 30 tablet 4  . HYDROcodone-acetaminophen (NORCO) 10-325 MG tablet Take 2 tablets by mouth every 8 (eight) hours. 190 tablet 0  . levothyroxine (SYNTHROID, LEVOTHROID) 100 MCG tablet Take 100 mcg by  mouth every morning.    Marland Kitchen LORazepam (ATIVAN) 1 MG tablet Take 1 mg by mouth every 6 (six) hours.    . ondansetron (ZOFRAN) 8 MG tablet Take 1 tablet (8 mg total) by mouth every 8 (eight) hours as needed for nausea or vomiting. 30 tablet 3  . Polyethyl Glycol-Propyl Glycol (SYSTANE OP) Place 2 drops into both eyes daily as needed (dryness).    . prochlorperazine (COMPAZINE) 10 MG tablet Take 1 tablet (10 mg total) by mouth every 6 (six) hours as needed for nausea or vomiting. 30 tablet 0  . promethazine (PHENERGAN) 25 MG tablet Take 25 mg by mouth every 6 (six) hours as needed. With Imitrex For nausea    . SUMAtriptan (IMITREX) 100 MG tablet Take 100 mg by mouth every 2 (two) hours as needed for migraine. May repeat in 2 hours if headache persists or recurs.    . SUMAtriptan (IMITREX) 6 MG/0.5ML SOLN injection Inject 0.5 mLs (6 mg total) into the skin every 2 (two) hours as needed for migraine or headache. May repeat in 2 hours if headache persists or recurs. 5 vial 2  . topiramate (TOPAMAX) 100 MG tablet Take 1 tablet (100 mg total) by mouth 2 (two) times daily. 60 tablet 3  . vitamin E 400 UNIT capsule Take 400 Units by mouth every morning.     No current facility-administered medications for this visit.     SURGICAL HISTORY:  Past Surgical History:  Procedure Laterality Date  . ABDOMINAL HYSTERECTOMY    . APPENDECTOMY    . CHOLECYSTECTOMY N/A 04/01/2014   Procedure: LAPAROSCOPIC CHOLECYSTECTOMY WITH INTRAOPERATIVE CHOLANGIOGRAM;  Surgeon: Jackolyn Confer, MD;  Location: [facility];  Service: General;  Laterality: N/A;  . ERCP N/A 04/02/2014   Procedure: ENDOSCOPIC RETROGRADE CHOLANGIOPANCREATOGRAPHY (ERCP);  Surgeon: Milus Banister, MD;  Location: [facility];  Service: Gastroenterology;  Laterality: N/A;  . INNER EAR SURGERY    . MASTOIDECTOMY    . MOLE REMOVAL    . RADIOLOGY WITH ANESTHESIA N/A 02/28/2016   Procedure: MRI OF THE BRAIN WITH AND WITHOUT;  Surgeon: Medication Radiologist, MD;   Location: [facility];  Service: Radiology;  Laterality: N/A;    REVIEW OF SYSTEMS:  Constitutional: positive for anorexia and fatigue Eyes: negative Ears, nose, mouth, throat, and face: negative Respiratory: negative Cardiovascular: negative Gastrointestinal: positive for nausea Genitourinary:negative Integument/breast: negative Hematologic/lymphatic: negative Musculoskeletal:positive for back pain Neurological: negative Behavioral/Psych: negative Endocrine: negative Allergic/Immunologic: negative   PHYSICAL EXAMINATION: General appearance: alert, cooperative, fatigued and no distress Head: Normocephalic, without obvious abnormality, atraumatic Neck: no adenopathy, no JVD, supple, symmetrical, trachea midline and thyroid not enlarged, symmetric, no tenderness/mass/nodules Lymph nodes: Cervical, supraclavicular, and axillary nodes normal. Resp: clear to auscultation bilaterally Back: symmetric, no curvature. ROM normal. No CVA tenderness. Cardio: regular rate and rhythm, S1, S2 normal, no murmur, click, rub or gallop GI: soft, non-tender; bowel sounds normal; no masses,  no organomegaly Extremities: extremities normal, atraumatic, no cyanosis or edema Neurologic: Alert and oriented X 3, normal strength and tone. Normal symmetric reflexes. Normal coordination and gait  ECOG PERFORMANCE STATUS: 1 -  Symptomatic but completely ambulatory  Blood pressure (!) 83/71, pulse 86, temperature 98 F (36.7 C), temperature source Oral, resp. rate 17, height 5' 4"  (1.626 m), weight 125 lb 3.2 oz (56.8 kg), SpO2 96 %. Repeat blood pressure was 99/71.  LABORATORY DATA: Lab Results  Component Value Date   WBC 4.1 05/22/2016   HGB 11.3 (L) 05/22/2016   HCT 36.3 05/22/2016   MCV 89.4 05/22/2016   PLT 339 05/22/2016      Chemistry      Component Value Date/Time   NA 136 05/10/2016 1047   K 4.4 05/10/2016 1047   CL 102 01/11/2016 1037   CO2 26 05/10/2016 1047   BUN 13.7 05/10/2016 1047    CREATININE 1.0 05/10/2016 1047      Component Value Date/Time   CALCIUM 9.1 05/10/2016 1047   ALKPHOS 121 05/10/2016 1047   AST 31 05/10/2016 1047   ALT 39 05/10/2016 1047   BILITOT 0.26 05/10/2016 1047       RADIOGRAPHIC STUDIES: Ct Chest W Contrast  Result Date: 04/30/2016 CLINICAL DATA:  61 year old female with history of stage IV adenocarcinoma of the left lung. Restaging examination before initiation of chemotherapy. EXAM: CT CHEST, ABDOMEN, AND PELVIS WITH CONTRAST TECHNIQUE: Multidetector CT imaging of the chest, abdomen and pelvis was performed following the standard protocol during bolus administration of intravenous contrast. CONTRAST:  13m ISOVUE-300 IOPAMIDOL (ISOVUE-300) INJECTION 61% COMPARISON:  Outside chest CT 02/07/2016.  PET-CT 02/14/2016. FINDINGS: CT CHEST FINDINGS Cardiovascular: Heart size is normal. There is no significant pericardial fluid, thickening or pericardial calcification. Minimal atherosclerotic calcifications are noted in the thoracic aorta apparent no definite coronary artery calcifications are identified. Mediastinum/Nodes: Prominent soft tissue in the left hilar region presumably reflects confluent lymphadenopathy, measuring up to 1 cm in thickness. 12 mm short axis right hilar lymph node also noted. No mediastinal lymphadenopathy. Esophagus is unremarkable in appearance. No axillary lymphadenopathy. Lungs/Pleura: Previously noted left upper lobe mass has significantly increased in size compared to the prior examination, and is now associated with surrounding volume loss and airspace consolidation making accurate measurement of the mass challenging. The mass is estimated to measure approximately 4.8 x 4.5 cm on today's examination (image 40 of series 4). The extensive adjacent septal thickening and consolidative changes may reflect additional areas of local invasion, or could reflect some postobstructive changes. Some of these changes do extend toward the left  hilum, favoring some degree of malignant extension. Importantly, there are innumerable new and enlarging pulmonary nodules scattered throughout all aspects of the lungs bilaterally which appear randomly distributed, highly concerning for widespread metastatic disease to the lungs. Increasing small left pleural effusion. Musculoskeletal: Well-defined sclerotic lesion with narrow zone of transition in the right side of the T2 vertebral body is similar to the prior study, presumably a bone island. CT ABDOMEN PELVIS FINDINGS Hepatobiliary: Diffuse low attenuation throughout the hepatic parenchyma, compatible with a background of hepatic steatosis. In segment 3 of the liver there is a benign mm well-defined low-attenuation lesion which is similar to the prior CT examination, too small to characterize, but favored to represent a tiny cyst. No other suspicious hepatic lesions are noted. Pancreas: No pancreatic mass. No pancreatic ductal dilatation. No pancreatic or peripancreatic fluid or inflammatory changes. Spleen: Unremarkable. Adrenals/Urinary Tract: Bilateral adrenal glands and the left kidney are normal in appearance. subcentimeter low-attenuation lesion in the upper pole of the right kidney is too small to characterize, but statistically likely a tiny cyst. No hydroureteronephrosis. Urinary bladder is normal  in appearance. Stomach/Bowel: The appearance of the stomach is normal. Large duodenal diverticulum from the third portion of the duodenum incidentally noted. No surrounding inflammatory changes to suggest diverticulitis at this time. No pathologic dilatation of small bowel or colon. The appendix is not confidently identified and may be surgically absent. Regardless, there are no inflammatory changes noted adjacent to the cecum to suggest the presence of an acute appendicitis at this time. Vascular/Lymphatic: Aortic atherosclerosis, without evidence of aneurysm or dissection in the abdominal or pelvic  vasculature. No lymphadenopathy noted in the abdomen or pelvis. Surgical clips along the right pelvic sidewall, suggesting prior lymph node dissection. Reproductive: Status post hysterectomy. Ovaries are not confidently identified and likely surgically absent, or potentially atrophic. Other: Tiny supraumbilical ventral hernia containing only omental fat No significant volume of ascites. No pneumoperitoneum. Musculoskeletal: Enlarging faintly sclerotic lesion in the right side of the L1 vertebral body which currently measures 3.1 x 2.2 cm (image 136 of series 4). Well-defined sclerotic lesion with narrow zone of transition in the left ilium is similar to the prior study, presumably a small bone island. IMPRESSION: 1. Progression of disease as evidenced by interval enlargement of previously noted left upper lobe mass, development of a left-sided pleural effusion which is likely malignant, progressive mild lateral hilar lymphadenopathy, enlarging osseous lesion in the L1 vertebral body, and interval increase in number and size of innumerable small pulmonary nodules scattered throughout the lungs bilaterally which presumably reflect metastatic lesions. 2. Aortic atherosclerosis. 3. Hepatic steatosis. 4. Additional incidental findings, as above. Electronically Signed   By: Vinnie Langton M.D.   On: 04/30/2016 09:46   Ct Abdomen Pelvis W Contrast  Result Date: 04/30/2016 CLINICAL DATA:  61 year old female with history of stage IV adenocarcinoma of the left lung. Restaging examination before initiation of chemotherapy. EXAM: CT CHEST, ABDOMEN, AND PELVIS WITH CONTRAST TECHNIQUE: Multidetector CT imaging of the chest, abdomen and pelvis was performed following the standard protocol during bolus administration of intravenous contrast. CONTRAST:  177m ISOVUE-300 IOPAMIDOL (ISOVUE-300) INJECTION 61% COMPARISON:  Outside chest CT 02/07/2016.  PET-CT 02/14/2016. FINDINGS: CT CHEST FINDINGS Cardiovascular: Heart size is  normal. There is no significant pericardial fluid, thickening or pericardial calcification. Minimal atherosclerotic calcifications are noted in the thoracic aorta apparent no definite coronary artery calcifications are identified. Mediastinum/Nodes: Prominent soft tissue in the left hilar region presumably reflects confluent lymphadenopathy, measuring up to 1 cm in thickness. 12 mm short axis right hilar lymph node also noted. No mediastinal lymphadenopathy. Esophagus is unremarkable in appearance. No axillary lymphadenopathy. Lungs/Pleura: Previously noted left upper lobe mass has significantly increased in size compared to the prior examination, and is now associated with surrounding volume loss and airspace consolidation making accurate measurement of the mass challenging. The mass is estimated to measure approximately 4.8 x 4.5 cm on today's examination (image 40 of series 4). The extensive adjacent septal thickening and consolidative changes may reflect additional areas of local invasion, or could reflect some postobstructive changes. Some of these changes do extend toward the left hilum, favoring some degree of malignant extension. Importantly, there are innumerable new and enlarging pulmonary nodules scattered throughout all aspects of the lungs bilaterally which appear randomly distributed, highly concerning for widespread metastatic disease to the lungs. Increasing small left pleural effusion. Musculoskeletal: Well-defined sclerotic lesion with narrow zone of transition in the right side of the T2 vertebral body is similar to the prior study, presumably a bone island. CT ABDOMEN PELVIS FINDINGS Hepatobiliary: Diffuse low attenuation throughout the hepatic  parenchyma, compatible with a background of hepatic steatosis. In segment 3 of the liver there is a benign mm well-defined low-attenuation lesion which is similar to the prior CT examination, too small to characterize, but favored to represent a tiny cyst.  No other suspicious hepatic lesions are noted. Pancreas: No pancreatic mass. No pancreatic ductal dilatation. No pancreatic or peripancreatic fluid or inflammatory changes. Spleen: Unremarkable. Adrenals/Urinary Tract: Bilateral adrenal glands and the left kidney are normal in appearance. subcentimeter low-attenuation lesion in the upper pole of the right kidney is too small to characterize, but statistically likely a tiny cyst. No hydroureteronephrosis. Urinary bladder is normal in appearance. Stomach/Bowel: The appearance of the stomach is normal. Large duodenal diverticulum from the third portion of the duodenum incidentally noted. No surrounding inflammatory changes to suggest diverticulitis at this time. No pathologic dilatation of small bowel or colon. The appendix is not confidently identified and may be surgically absent. Regardless, there are no inflammatory changes noted adjacent to the cecum to suggest the presence of an acute appendicitis at this time. Vascular/Lymphatic: Aortic atherosclerosis, without evidence of aneurysm or dissection in the abdominal or pelvic vasculature. No lymphadenopathy noted in the abdomen or pelvis. Surgical clips along the right pelvic sidewall, suggesting prior lymph node dissection. Reproductive: Status post hysterectomy. Ovaries are not confidently identified and likely surgically absent, or potentially atrophic. Other: Tiny supraumbilical ventral hernia containing only omental fat No significant volume of ascites. No pneumoperitoneum. Musculoskeletal: Enlarging faintly sclerotic lesion in the right side of the L1 vertebral body which currently measures 3.1 x 2.2 cm (image 136 of series 4). Well-defined sclerotic lesion with narrow zone of transition in the left ilium is similar to the prior study, presumably a small bone island. IMPRESSION: 1. Progression of disease as evidenced by interval enlargement of previously noted left upper lobe mass, development of a left-sided  pleural effusion which is likely malignant, progressive mild lateral hilar lymphadenopathy, enlarging osseous lesion in the L1 vertebral body, and interval increase in number and size of innumerable small pulmonary nodules scattered throughout the lungs bilaterally which presumably reflect metastatic lesions. 2. Aortic atherosclerosis. 3. Hepatic steatosis. 4. Additional incidental findings, as above. Electronically Signed   By: Vinnie Langton M.D.   On: 04/30/2016 09:46    ASSESSMENT AND PLAN:  This is a very pleasant 61 years old white female with a stage IV non-small cell lung cancer, adenocarcinoma with multiple brain metastasis status post whole brain irradiation. The patient was started on systemic chemotherapy with carboplatin, Alimta and Avastin status post 1 cycle. She has been complaining of increasing fatigue and weakness as well as persistent nausea after her treatment. I had a lengthy discussion with the patient today again about her current condition and treatment options. The patient is not interested in proceeding with any further systemic chemotherapy at this point. I discussed with her referral to palliative care and hospice. She is in agreement with this plan. We will call the hospice service of [facility] to see the patient. For the chronic pain management, she will continue her pain medication as prescribed by her primary care physician Dr. Kathyrn Lass. For the hypotension, the patient was encouraged to increase her oral intake and hydration. For the nausea, she will continue on Zofran on as-needed basis. For depression, the patient will continue her current treatment with Celexa. For anxiety, she will continue her current treatment with Ativan. I will cancel her treatment plans. I will see the patient as-needed basis at this point. She was  advised to clinic she has any concerning symptoms. The patient voices understanding of current disease status and treatment options and is  in agreement with the current care plan.  All questions were answered. The patient knows to call the clinic with any problems, questions or concerns. We can certainly see the patient much sooner if necessary.  Disclaimer: This note was dictated with voice recognition software. Similar sounding words can inadvertently be transcribed and may not be corrected upon review.

## 2016-05-22 NOTE — Telephone Encounter (Signed)
Referred to hospice.

## 2016-05-22 NOTE — Progress Notes (Signed)
Oncology Nurse Navigator Documentation  Oncology Nurse Navigator Flowsheets 05/22/2016  Navigator Location [facility]  Navigator Encounter Type Clinic/MDC/I followed up with Dr. [name] on Jenna Molina.  He states she does not want treatment and wants Hospice.  Diane RN has notified Hospice of referral.  I updated Rad Onc nurse.   Treatment Phase Follow-up  Barriers/Navigation Needs Coordination of Care  Interventions Coordination of Care  Acuity Level 1  Time Spent with Patient 30

## 2016-05-24 ENCOUNTER — Other Ambulatory Visit: Payer: Self-pay | Admitting: Radiation Oncology

## 2016-05-24 ENCOUNTER — Encounter: Admitting: Psychology

## 2016-05-24 ENCOUNTER — Encounter: Payer: Self-pay | Admitting: General Practice

## 2016-05-24 DIAGNOSIS — R110 Nausea: Secondary | ICD-10-CM

## 2016-05-24 NOTE — Progress Notes (Signed)
[place] Spiritual Care Note  LVM of support and care because Byanca and I were not able to meet again in infusion as previously planned (due to her decision for hospice instead of tx).  Reminded her of ongoing support availability as desired.   [place], [place], [facility] Pager [phone] Voicemail [phone]

## 2016-05-29 ENCOUNTER — Other Ambulatory Visit: Payer: Medicare HMO

## 2016-05-29 ENCOUNTER — Encounter: Payer: Self-pay | Admitting: Physical Medicine & Rehabilitation

## 2016-05-29 ENCOUNTER — Encounter: Attending: Physical Medicine & Rehabilitation | Admitting: Physical Medicine & Rehabilitation

## 2016-05-29 DIAGNOSIS — M609 Myositis, unspecified: Secondary | ICD-10-CM | POA: Diagnosis not present

## 2016-05-29 DIAGNOSIS — G43001 Migraine without aura, not intractable, with status migrainosus: Secondary | ICD-10-CM | POA: Diagnosis not present

## 2016-05-29 DIAGNOSIS — M75101 Unspecified rotator cuff tear or rupture of right shoulder, not specified as traumatic: Secondary | ICD-10-CM

## 2016-05-29 DIAGNOSIS — G894 Chronic pain syndrome: Secondary | ICD-10-CM | POA: Diagnosis not present

## 2016-05-29 DIAGNOSIS — M797 Fibromyalgia: Secondary | ICD-10-CM

## 2016-05-29 DIAGNOSIS — Z79899 Other long term (current) drug therapy: Secondary | ICD-10-CM | POA: Diagnosis not present

## 2016-05-29 DIAGNOSIS — M791 Myalgia: Secondary | ICD-10-CM | POA: Insufficient documentation

## 2016-05-29 DIAGNOSIS — Z5181 Encounter for therapeutic drug level monitoring: Secondary | ICD-10-CM | POA: Diagnosis not present

## 2016-05-29 DIAGNOSIS — G43011 Migraine without aura, intractable, with status migrainosus: Secondary | ICD-10-CM

## 2016-05-29 DIAGNOSIS — C7931 Secondary malignant neoplasm of brain: Secondary | ICD-10-CM

## 2016-05-29 DIAGNOSIS — C3492 Malignant neoplasm of unspecified part of left bronchus or lung: Secondary | ICD-10-CM

## 2016-05-29 MED ORDER — HYDROCODONE-ACETAMINOPHEN 10-325 MG PO TABS: 2.0000 | ORAL_TABLET | Freq: Three times a day (TID) | ORAL | 0 refills | Status: AC

## 2016-05-29 MED ORDER — FENTANYL 37.5 MCG/HR TD PT72: 37.5000 ug | MEDICATED_PATCH | TRANSDERMAL | 0 refills | Status: AC

## 2016-05-29 NOTE — Progress Notes (Signed)
Subjective:    Patient ID: Jenna Molina, female    DOB: [birth year], 61 y.o.   MRN: [id number]  HPI  Jenna Molina is here in follow up of her chronic pain. She is now on hospice. The med changes we made are working well for her so far. She is tolerating the fentanyl and her pain is controlled without her feeling excessively sleepy. She mentioned that the onc team had discussed changing her to something "different" other than fentanyl. She would prefer staying on the fentanyl patch.   She is finding it increasingly difficult to perform basic ADL's. Tasks such as doing her laundry are becoming more of a struggle, causing a lot more back pain.   For pain I have her on a fentanyl patch 67mg and hydrocodone 10/'325mg'$  [id number]  Pain Inventory Average Pain 4 Pain Right Now 5 My pain is intermittent, sharp, stabbing and aching  In the last 24 hours, has pain interfered with the following? General activity 7 Relation with others 6 Enjoyment of life 6 What TIME of day is your pain at its worst? all Sleep (in general) Good  Pain is worse with: walking, bending, sitting, inactivity, standing and some activites Pain improves with: rest, heat/ice, pacing activities, medication and injections Relief from Meds: 8  Mobility Do you have any goals in this area?  no  Function disabled: date disabled . I need assistance with the following:  household duties and shopping Do you have any goals in this area?  no  Neuro/Psych bladder control problems weakness trouble walking  Prior Studies Any changes since last visit?  no  Physicians involved in your care Any changes since last visit?  no   Family History  Problem Relation Age of Onset  . Cancer Mother   . Diabetes Mother   . Anxiety disorder Mother   . Emphysema Father   . Anxiety disorder Father   . Alcohol abuse Father   . COPD Father    Social History   Social History  . Marital status: Married    Spouse name: N/A  . Number of  children: N/A  . Years of education: N/A   Social History Main Topics  . Smoking status: Former Smoker    Quit date: 06/19/2008  . Smokeless tobacco: Never Used  . Alcohol use No  . Drug use: No  . Sexual activity: Yes   Other Topics Concern  . None   Social History Narrative  . None   Past Surgical History:  Procedure Laterality Date  . ABDOMINAL HYSTERECTOMY    . APPENDECTOMY    . CHOLECYSTECTOMY N/A 04/01/2014   Procedure: LAPAROSCOPIC CHOLECYSTECTOMY WITH INTRAOPERATIVE CHOLANGIOGRAM;  Surgeon: TJackolyn Confer MD;  Location: [facility];  Service: General;  Laterality: N/A;  . ERCP N/A 04/02/2014   Procedure: ENDOSCOPIC RETROGRADE CHOLANGIOPANCREATOGRAPHY (ERCP);  Surgeon: DMilus Banister MD;  Location: [facility];  Service: Gastroenterology;  Laterality: N/A;  . INNER EAR SURGERY    . MASTOIDECTOMY    . MOLE REMOVAL    . RADIOLOGY WITH ANESTHESIA N/A 02/28/2016   Procedure: MRI OF THE BRAIN WITH AND WITHOUT;  Surgeon: Medication Radiologist, MD;  Location: [facility]  Service: Radiology;  Laterality: N/A;   Past Medical History:  Diagnosis Date  . [place]  . Anxiety   . Cancer (HCC)    Cervical cancer, lung cancer   . [place]   . Depression   . Encounter for antineoplastic  chemotherapy 04/25/2016  . Fibromyalgia   . GERD (gastroesophageal reflux disease)   . Goals of care, counseling/discussion 04/25/2016  . Heart murmur    Mitral Valve prolapse  . Hypothyroidism   . Irritable bowel    constipation  . Migraines   . Neuromuscular disorder ([place])   . Pneumonia    BP 96/68   Pulse 91   Resp 14   SpO2 92%   Opioid Risk Score:   Fall Risk Score:  `1  Depression screen PHQ 2/9  Depression screen [facility] 2/9 03/06/2016 01/11/2016 09/20/2015 07/20/2015 05/24/2015 12/06/2014 10/12/2014  Decreased Interest 2 2 0 '1 3 3 2  '$ Down, Depressed, Hopeless 2 2 0 '1 3 3 2  '$ PHQ - 2 Score 4 4 0 '2 6 6 4  '$ Altered sleeping - - - 0 - - 2    Tired, decreased energy - - - 1 - - 2  Change in appetite - - - 2 - - 1  Feeling bad or failure about yourself  - - - 0 - - 1  Trouble concentrating - - - 0 - - 0  Moving slowly or fidgety/restless - - - 0 - - 0  Suicidal thoughts - - - 0 - - 1  PHQ-9 Score - - - 5 - - 11    Review of Systems  Constitutional: Positive for appetite change.  HENT: Negative.   Eyes: Negative.   Respiratory: Positive for shortness of breath.   Cardiovascular: Negative.   Gastrointestinal: Positive for nausea.  Genitourinary: Negative.   Musculoskeletal: Positive for arthralgias, back pain and gait problem.  Skin: Negative.   Allergic/Immunologic: Negative.   Neurological: Positive for weakness.  Hematological: Negative.   Psychiatric/Behavioral: Negative.        Objective:   Physical Exam   General: Ax0 x3 HEENT:PERRL Neck:Supple without JVD or lymphadenopathy  Heart:RRR Chest:clear Abdomen: non distended Extremities:No clubbing, cyanosis, or edema. Pulses are 2+  Skin:Clean and intact without signs of breakdown  Neuro:CN intact. motor 5/5. Sensory normal Musculoskeletal:some soreness along low and mid back with flexion. Psych:pleasant, tearful, reflective      Assessment & Plan:  1. FMS with associated symptoms including: chronic fatigue, depression, migraine headaches, RLS, TMJ, numerous tender points and trigger points.  2. Hx of major depression  3. Hypothyroid 4. 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 --now on hospice.    Plan:  1. Refilled hydrocodone 10/325 one to 2 q8 prn, maintain at #190 -contnue fentanyl 37.90mg/hr -I RECOMMEND THAT SHE STAY WITH THE FENTANYL AS LONG AS SHE CAN AS IT HAS WORKED WELL FOR HER.   -I AM HAPPY TO DISCUSS CASE WITH ONC/HOSPICE AS NEEDED. 2. Sq imitrex for severe, breakthrough migraine treatment.              -  topamax   '100mg'$  BID  3. Stress/anxiety has always been a struggle for  her. She is having a lot of difficulties now.             -continue Celexa at '60mg'$  daily             -. 4. Cancer treatment per onc/rad-onc 5. Follow up with me in prn.  15 minutes of face to face patient care time were spent during this visit. All questions were encouraged and answered.          Assessment & Plan:

## 2016-05-29 NOTE — Patient Instructions (Signed)
PLEASE FEEL FREE TO CALL OUR OFFICE WITH ANY PROBLEMS OR QUESTIONS ([phone])      

## 2016-05-31 ENCOUNTER — Encounter: Payer: Self-pay | Admitting: *Deleted

## 2016-05-31 NOTE — Progress Notes (Signed)
1318 Called and left a message on Mr. Samuella Cota answering machine that Mrs. Verrastro did not need to come to her Tuesday, 06/05/16 appointment at 1515 since she is under hospice care.  Asked to call back to [phone] if there are any questions and ask for Ashyr Hedgepath the nurse that works with [name], PA-C for Drs.Lisbeth Renshaw and Tammi Klippel

## 2016-05-31 NOTE — Progress Notes (Signed)
1316 Called and left a message on the home answering machine that Jenna Molina did not need to come to her Tuesday, 06/05/16 appointment at 1515 since she is under hospice care.  Asked to call back to [phone] if there are any questions and ask for Tonie the nurse that works with [name], PA-C for Drs.Lisbeth Renshaw and Tammi Klippel.

## 2016-06-01 ENCOUNTER — Encounter: Payer: Self-pay | Admitting: *Deleted

## 2016-06-01 ENCOUNTER — Telehealth: Payer: Self-pay

## 2016-06-01 NOTE — Telephone Encounter (Signed)
MD out of office will discuss upon his return 2/19.

## 2016-06-01 NOTE — Progress Notes (Signed)
0900 Called Jenna Molina cell phone number no answer did not leave a new message today; will continue to call in hope that he will answer his telephone or call back from the message left on yesterday.

## 2016-06-01 NOTE — Telephone Encounter (Signed)
Megan RN with hospice called asking if Dr [name] will refill pt migraine medicine (prior order by Dr Tessa Lerner). She has been using 2 pills per day and has only 1 left. She has no injections left and they usually do the trick. She uses Paediatric nurse on [address].

## 2016-06-05 ENCOUNTER — Ambulatory Visit
Admission: RE | Admit: 2016-06-05 | Discharge: 2016-06-05 | Disposition: A | Discharge status: Home / Self Care | Payer: Medicare HMO | Source: Ambulatory Visit | Attending: Radiation Oncology | Admitting: Radiation Oncology

## 2016-06-05 ENCOUNTER — Other Ambulatory Visit: Payer: Medicare HMO

## 2016-06-05 NOTE — Telephone Encounter (Signed)
I left Jenna Molina a message to contact prescribing MD for migraine medications-that is not something [name] prescribes.

## 2016-06-12 ENCOUNTER — Ambulatory Visit: Payer: Medicare HMO | Admitting: Internal Medicine

## 2016-06-12 ENCOUNTER — Ambulatory Visit: Payer: Medicare HMO

## 2016-06-12 ENCOUNTER — Other Ambulatory Visit: Payer: Medicare HMO

## 2016-06-27 NOTE — Telephone Encounter (Signed)
Error

## 2016-07-09 ENCOUNTER — Encounter: Payer: Self-pay | Admitting: Radiation Therapy

## 2016-07-09 ENCOUNTER — Other Ambulatory Visit: Payer: Self-pay | Admitting: Radiation Therapy

## 2016-07-09 DIAGNOSIS — C7931 Secondary malignant neoplasm of brain: Secondary | ICD-10-CM

## 2016-07-09 NOTE — Progress Notes (Signed)
Patient is under Hospice care. No brain MRI or follow-up appointment with Dr. Tammi Klippel scheduled.  Mont Dutton R.T.(R)(T) Special Procedures Navigator

## 2016-07-19 ENCOUNTER — Other Ambulatory Visit: Payer: Self-pay | Admitting: Internal Medicine

## 2016-07-19 ENCOUNTER — Telehealth: Payer: Self-pay | Admitting: *Deleted

## 2016-07-19 DIAGNOSIS — R112 Nausea with vomiting, unspecified: Secondary | ICD-10-CM

## 2016-07-19 DIAGNOSIS — T451X5A Adverse effect of antineoplastic and immunosuppressive drugs, initial encounter: Principal | ICD-10-CM

## 2016-07-19 NOTE — Telephone Encounter (Signed)
Megan called for refills on Fentanyl and hydrocodone.  We are not the prescribing physician since moving into hospice.  By [facility] it appears DR Jenene Slicker or [facility]ONGUILOD are the prescribers. [place] notified.

## 2016-08-13 ENCOUNTER — Other Ambulatory Visit: Payer: Self-pay | Admitting: Internal Medicine

## 2016-08-13 DIAGNOSIS — T451X5A Adverse effect of antineoplastic and immunosuppressive drugs, initial encounter: Principal | ICD-10-CM

## 2016-08-13 DIAGNOSIS — R112 Nausea with vomiting, unspecified: Secondary | ICD-10-CM

## 2016-09-04 ENCOUNTER — Telehealth: Payer: Self-pay | Admitting: Medical Oncology

## 2016-09-04 NOTE — Telephone Encounter (Signed)
Nurse reports pt desat to 79 % room air -added 3.5 liters oxygen and up to 87%/ Pt declining.

## 2016-11-08 ENCOUNTER — Telehealth: Payer: Self-pay | Admitting: Internal Medicine

## 2016-11-08 NOTE — Telephone Encounter (Signed)
Horris Latino called from hospice to inform us of the death of Jenna Molina on 11-12-16 at 11:53 at home

## 2016-11-14 DEATH — deceased

## 2017-11-24 IMAGING — CT CT CHEST W/ CM
2 of 4 series · 12 of 36 positions shown, 15 images · IV contrast (APPLIED)
Comparison: Outside chest CT 02/07/2016.  PET-CT 02/14/2016.

CLINICAL DATA: 60-year-old female with history of stage IV
adenocarcinoma of the left lung. Restaging examination before
initiation of chemotherapy.

EXAM:
CT CHEST, ABDOMEN, AND PELVIS WITH CONTRAST
TECHNIQUE: Multidetector CT imaging of the chest, abdomen and pelvis was
performed following the standard protocol during bolus
administration of intravenous contrast.
CONTRAST:  100mL FOWXPN-WEE IOPAMIDOL (FOWXPN-WEE) INJECTION 61%

[Series 2: cap with · axial · 0.79mm/px · z∈[+1204,+1674]mm · 9 of 116 slices shown, 12 images]
[im 11/116  mediastinal]
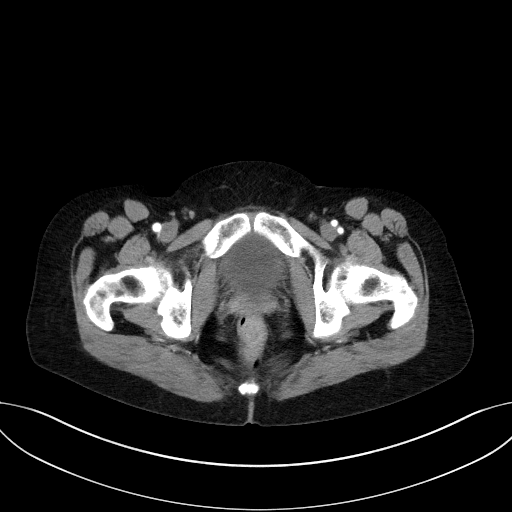
[im 11/116  lung]
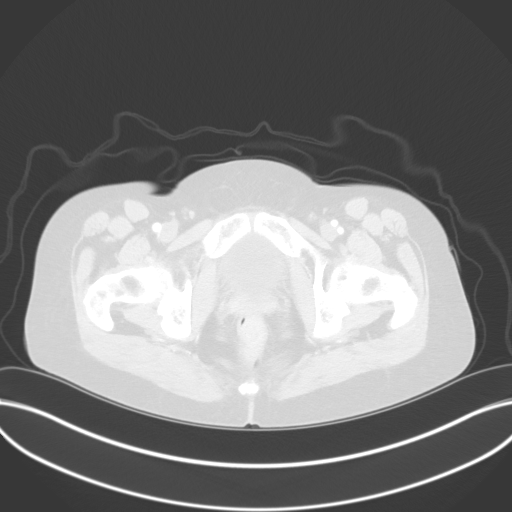
[im 22/116  lung]
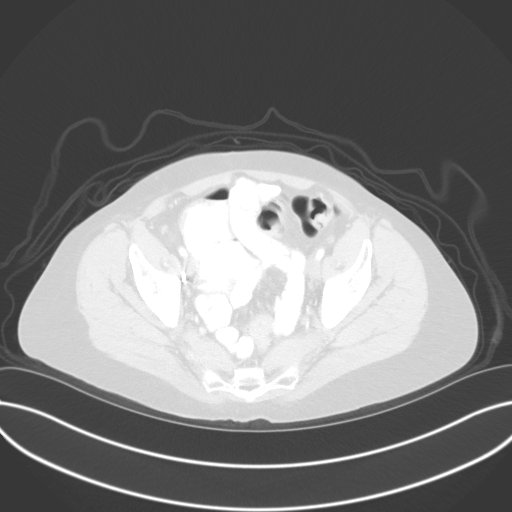
[im 33/116  lung]
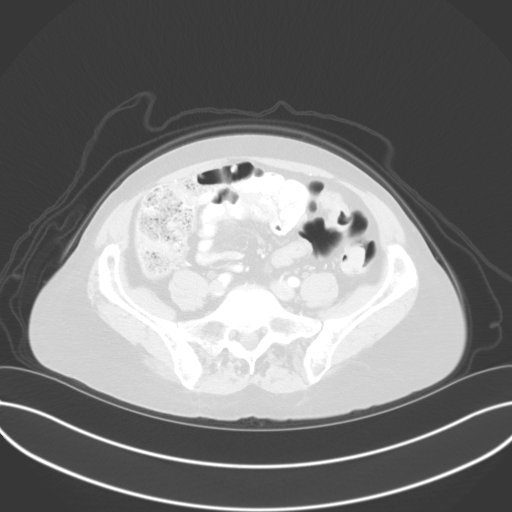
[im 44/116  lung]
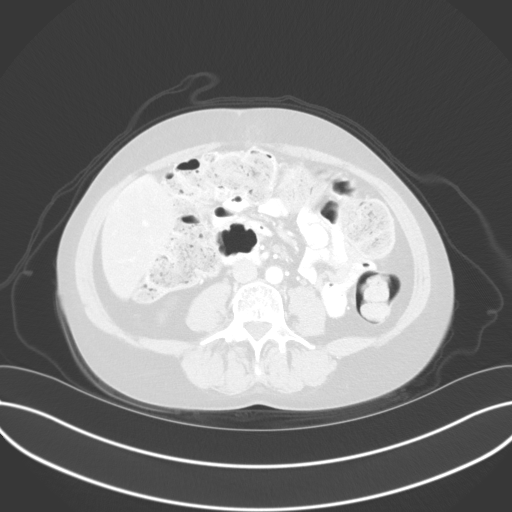
[im 61/116  mediastinal]
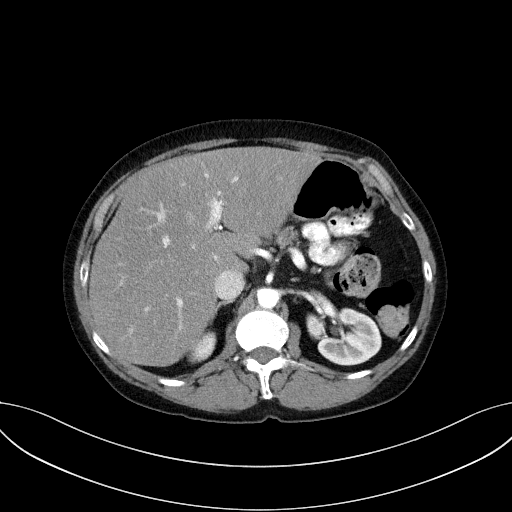
[im 61/116  lung]
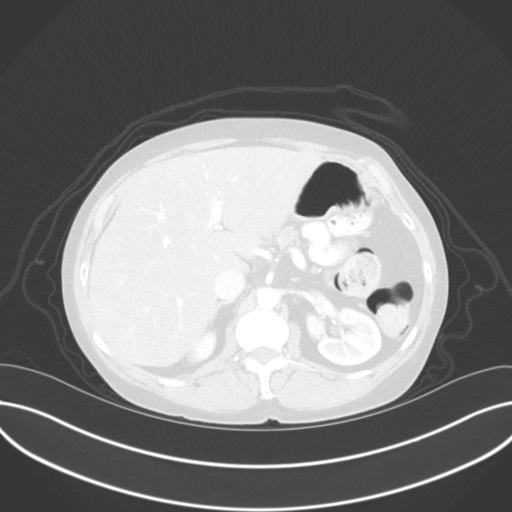
[im 72/116  lung]
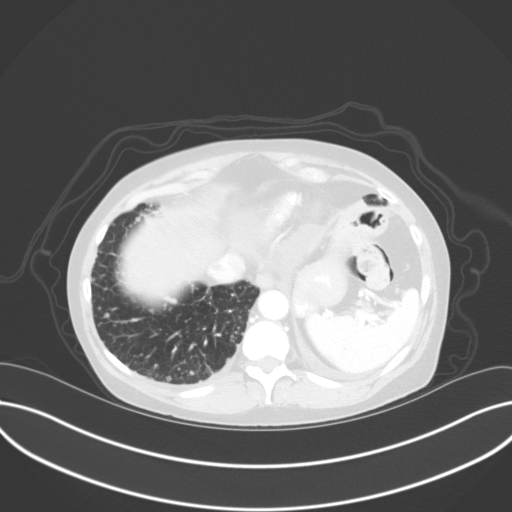
[im 83/116  lung]
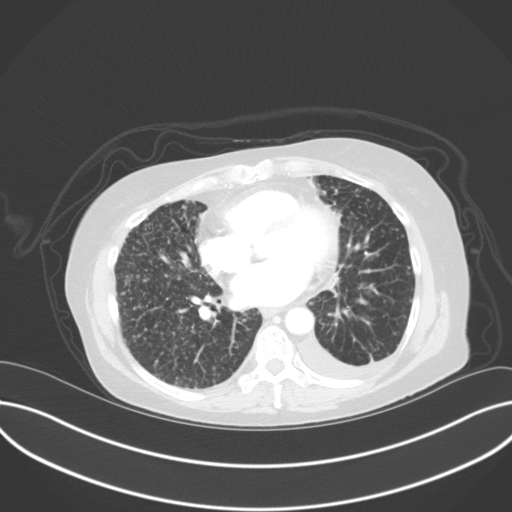
[im 94/116  lung]
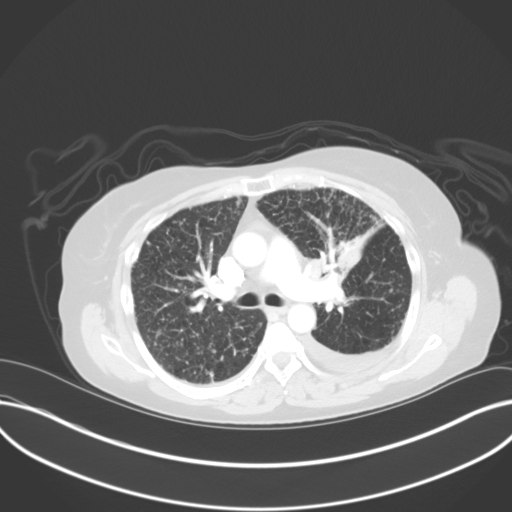
[im 105/116  mediastinal]
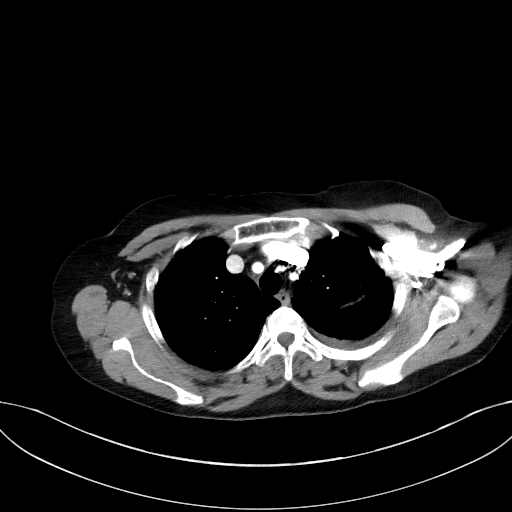
[im 105/116  lung]
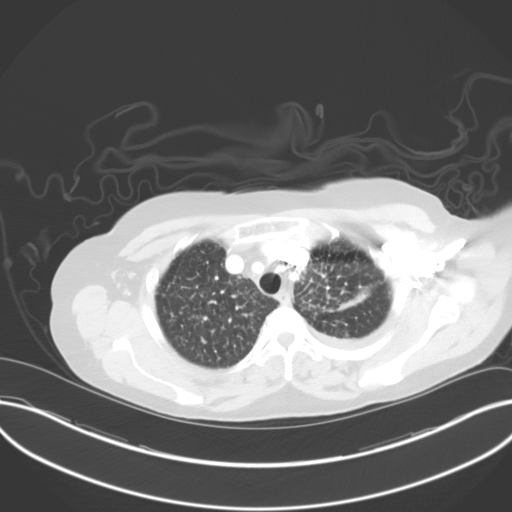

[Series 5: coronals · coronal · 0.82mm/px · 3 of 131 slices shown]
[im 27/131  lung]
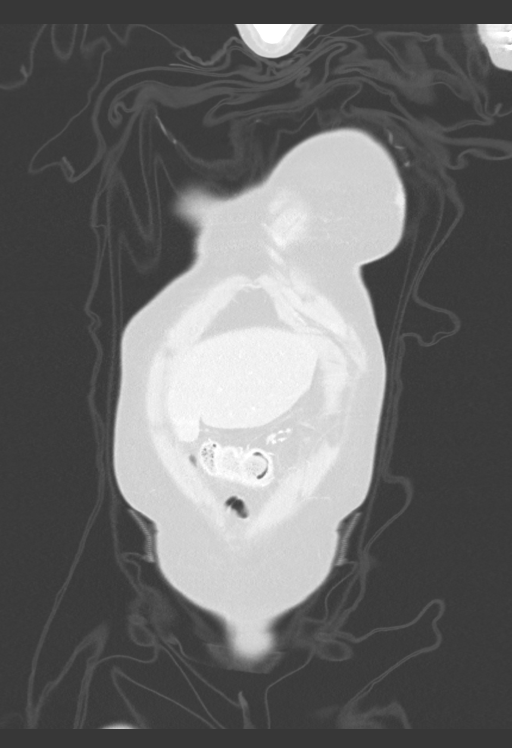
[im 53/131  lung]
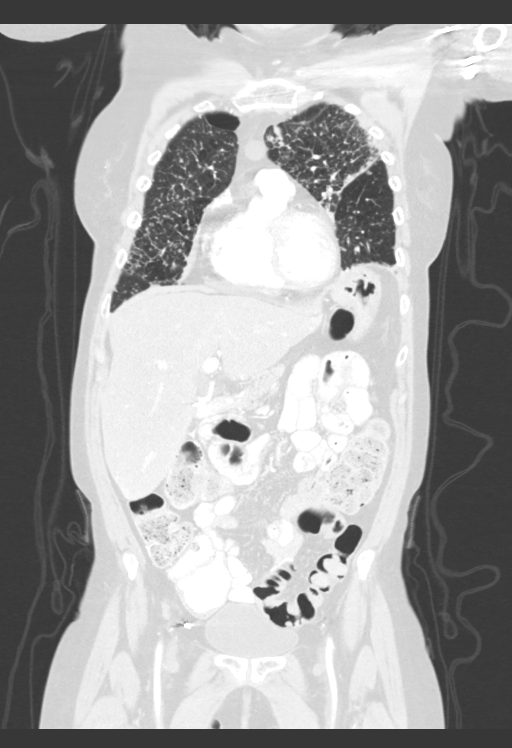
[im 79/131  lung]
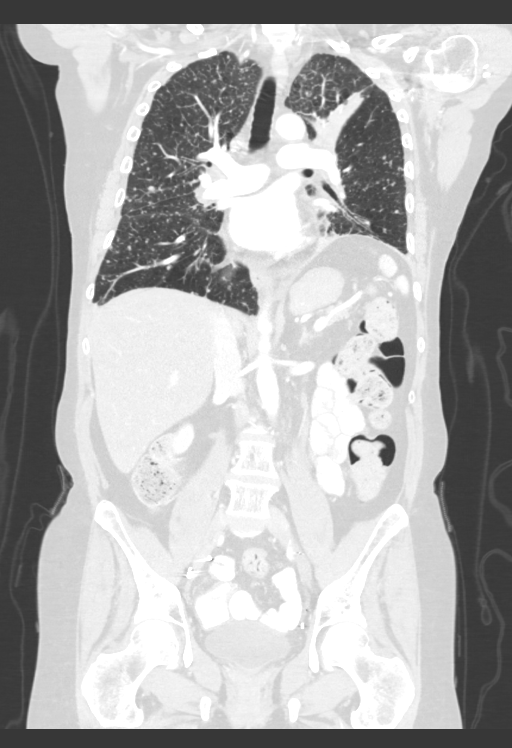

[12 of 36 positions shown; findings below may reference images not displayed]

FINDINGS: CT CHEST FINDINGS

Cardiovascular: Heart size is normal. There is no significant
pericardial fluid, thickening or pericardial calcification. Minimal
atherosclerotic calcifications are noted in the thoracic aorta
apparent no definite coronary artery calcifications are identified.

Mediastinum/Nodes: Prominent soft tissue in the left hilar region
presumably reflects confluent lymphadenopathy, measuring up to 1 cm
in thickness. 12 mm short axis right hilar lymph node also noted. No
mediastinal lymphadenopathy. Esophagus is unremarkable in
appearance. No axillary lymphadenopathy.

Lungs/Pleura: Previously noted left upper lobe mass has
significantly increased in size compared to the prior examination,
and is now associated with surrounding volume loss and airspace
consolidation making accurate measurement of the mass challenging.
The mass is estimated to measure approximately 4.8 x 4.5 cm on
today's examination (image 40 of series 4). The extensive adjacent
septal thickening and consolidative changes may reflect additional
areas of local invasion, or could reflect some postobstructive
changes. Some of these changes do extend toward the left hilum,
favoring some degree of malignant extension. Importantly, there are
innumerable new and enlarging pulmonary nodules scattered throughout
all aspects of the lungs bilaterally which appear randomly
distributed, highly concerning for widespread metastatic disease to
the lungs. Increasing small left pleural effusion.

Musculoskeletal: Well-defined sclerotic lesion with narrow zone of
transition in the right side of the T2 vertebral body is similar to
the prior study, presumably a bone island.

CT ABDOMEN PELVIS FINDINGS

Hepatobiliary: Diffuse low attenuation throughout the hepatic
parenchyma, compatible with a background of hepatic steatosis. In
segment 3 of the liver there is a benign mm well-defined
low-attenuation lesion which is similar to the prior CT examination,
too small to characterize, but favored to represent a tiny cyst. No
other suspicious hepatic lesions are noted.

Pancreas: No pancreatic mass. No pancreatic ductal dilatation. No
pancreatic or peripancreatic fluid or inflammatory changes.

Spleen: Unremarkable.

Adrenals/Urinary Tract: Bilateral adrenal glands and the left kidney
are normal in appearance. subcentimeter low-attenuation lesion in
the upper pole of the right kidney is too small to characterize, but
statistically likely a tiny cyst. No hydroureteronephrosis. Urinary
bladder is normal in appearance.

Stomach/Bowel: The appearance of the stomach is normal. Large
duodenal diverticulum from the third portion of the duodenum
incidentally noted. No surrounding inflammatory changes to suggest
diverticulitis at this time. No pathologic dilatation of small bowel
or colon. The appendix is not confidently identified and may be
surgically absent. Regardless, there are no inflammatory changes
noted adjacent to the cecum to suggest the presence of an acute
appendicitis at this time.

Vascular/Lymphatic: Aortic atherosclerosis, without evidence of
aneurysm or dissection in the abdominal or pelvic vasculature. No
lymphadenopathy noted in the abdomen or pelvis. Surgical clips along
the right pelvic sidewall, suggesting prior lymph node dissection.

Reproductive: Status post hysterectomy. Ovaries are not confidently
identified and likely surgically absent, or potentially atrophic.

Other: Tiny supraumbilical ventral hernia containing only omental
fat No significant volume of ascites. No pneumoperitoneum.

Musculoskeletal: Enlarging faintly sclerotic lesion in the right
side of the L1 vertebral body which currently measures 3.1 x 2.2 cm
(image 136 of series 4). Well-defined sclerotic lesion with narrow
zone of transition in the left ilium is similar to the prior study,
presumably a small bone island.
IMPRESSION: 1. Progression of disease as evidenced by interval enlargement of
previously noted left upper lobe mass, development of a left-sided
pleural effusion which is likely malignant, progressive mild lateral
hilar lymphadenopathy, enlarging osseous lesion in the L1 vertebral
body, and interval increase in number and size of innumerable small
pulmonary nodules scattered throughout the lungs bilaterally which
presumably reflect metastatic lesions.
2. Aortic atherosclerosis.
3. Hepatic steatosis.
4. Additional incidental findings, as above.
